# Patient Record
Sex: Female | Born: 1940 | Race: White | Hispanic: No | Smoking: Never smoker
Health system: Southern US, Community
[De-identification: ages and names within clinical notes are randomized; demographics above are authoritative.]

## PROBLEM LIST (undated history)

## (undated) DIAGNOSIS — R51 Headache: Secondary | ICD-10-CM

## (undated) DIAGNOSIS — L659 Nonscarring hair loss, unspecified: Secondary | ICD-10-CM

## (undated) DIAGNOSIS — E079 Disorder of thyroid, unspecified: Secondary | ICD-10-CM

## (undated) DIAGNOSIS — N189 Chronic kidney disease, unspecified: Secondary | ICD-10-CM

## (undated) DIAGNOSIS — R519 Headache, unspecified: Secondary | ICD-10-CM

## (undated) DIAGNOSIS — D72819 Decreased white blood cell count, unspecified: Secondary | ICD-10-CM

## (undated) DIAGNOSIS — F32A Depression, unspecified: Secondary | ICD-10-CM

## (undated) DIAGNOSIS — F329 Major depressive disorder, single episode, unspecified: Secondary | ICD-10-CM

## (undated) DIAGNOSIS — M1990 Unspecified osteoarthritis, unspecified site: Secondary | ICD-10-CM

## (undated) DIAGNOSIS — R251 Tremor, unspecified: Secondary | ICD-10-CM

## (undated) HISTORY — PX: WISDOM TOOTH EXTRACTION: SHX21

## (undated) HISTORY — DX: Decreased white blood cell count, unspecified: D72.819

## (undated) HISTORY — DX: Headache: R51

## (undated) HISTORY — PX: KNEE ARTHROSCOPY: SUR90

## (undated) HISTORY — DX: Chronic kidney disease, unspecified: N18.9

## (undated) HISTORY — PX: TUBAL LIGATION: SHX77

## (undated) HISTORY — DX: Nonscarring hair loss, unspecified: L65.9

## (undated) HISTORY — DX: Unspecified osteoarthritis, unspecified site: M19.90

## (undated) HISTORY — DX: Disorder of thyroid, unspecified: E07.9

## (undated) HISTORY — DX: Headache, unspecified: R51.9

## (undated) HISTORY — DX: Depression, unspecified: F32.A

## (undated) HISTORY — DX: Major depressive disorder, single episode, unspecified: F32.9

## (undated) HISTORY — DX: Tremor, unspecified: R25.1

---

## 1983-04-12 ENCOUNTER — Encounter (INDEPENDENT_AMBULATORY_CARE_PROVIDER_SITE_OTHER): Payer: Self-pay | Admitting: *Deleted

## 1998-08-31 ENCOUNTER — Encounter: Admission: RE | Admit: 1998-08-31 | Discharge: 1998-08-31 | Payer: Self-pay | Admitting: Family Medicine

## 1998-09-27 ENCOUNTER — Encounter: Admission: RE | Admit: 1998-09-27 | Discharge: 1998-09-27 | Payer: Self-pay | Admitting: Family Medicine

## 1999-12-20 ENCOUNTER — Other Ambulatory Visit: Admission: RE | Admit: 1999-12-20 | Discharge: 1999-12-20 | Payer: Self-pay | Admitting: *Deleted

## 2000-10-29 ENCOUNTER — Emergency Department (HOSPITAL_COMMUNITY): Admission: EM | Admit: 2000-10-29 | Discharge: 2000-10-29 | Payer: Self-pay | Admitting: Emergency Medicine

## 2001-01-07 ENCOUNTER — Ambulatory Visit (HOSPITAL_COMMUNITY): Admission: RE | Admit: 2001-01-07 | Discharge: 2001-01-07 | Payer: Self-pay | Admitting: Orthopedic Surgery

## 2001-01-15 ENCOUNTER — Other Ambulatory Visit: Admission: RE | Admit: 2001-01-15 | Discharge: 2001-01-15 | Payer: Self-pay | Admitting: *Deleted

## 2002-01-06 ENCOUNTER — Encounter: Admission: RE | Admit: 2002-01-06 | Discharge: 2002-01-06 | Payer: Self-pay | Admitting: Orthopedic Surgery

## 2002-01-06 ENCOUNTER — Encounter: Payer: Self-pay | Admitting: Orthopedic Surgery

## 2002-01-21 ENCOUNTER — Other Ambulatory Visit: Admission: RE | Admit: 2002-01-21 | Discharge: 2002-01-21 | Payer: Self-pay | Admitting: *Deleted

## 2003-02-16 ENCOUNTER — Other Ambulatory Visit: Admission: RE | Admit: 2003-02-16 | Discharge: 2003-02-16 | Payer: Self-pay | Admitting: Obstetrics and Gynecology

## 2004-03-02 ENCOUNTER — Other Ambulatory Visit: Admission: RE | Admit: 2004-03-02 | Discharge: 2004-03-02 | Payer: Self-pay | Admitting: Obstetrics and Gynecology

## 2004-07-04 ENCOUNTER — Encounter: Payer: Self-pay | Admitting: Internal Medicine

## 2005-05-10 ENCOUNTER — Encounter: Payer: Self-pay | Admitting: Pulmonary Disease

## 2005-05-10 ENCOUNTER — Ambulatory Visit (HOSPITAL_BASED_OUTPATIENT_CLINIC_OR_DEPARTMENT_OTHER): Admission: RE | Admit: 2005-05-10 | Discharge: 2005-05-10 | Payer: Self-pay | Admitting: Internal Medicine

## 2005-05-25 ENCOUNTER — Ambulatory Visit: Payer: Self-pay | Admitting: Pulmonary Disease

## 2005-06-25 ENCOUNTER — Ambulatory Visit: Payer: Self-pay | Admitting: Internal Medicine

## 2005-07-20 ENCOUNTER — Ambulatory Visit: Payer: Self-pay | Admitting: Internal Medicine

## 2006-02-07 ENCOUNTER — Ambulatory Visit: Payer: Self-pay | Admitting: Internal Medicine

## 2006-10-16 ENCOUNTER — Ambulatory Visit (HOSPITAL_BASED_OUTPATIENT_CLINIC_OR_DEPARTMENT_OTHER): Admission: RE | Admit: 2006-10-16 | Discharge: 2006-10-16 | Payer: Self-pay | Admitting: Internal Medicine

## 2006-10-16 ENCOUNTER — Encounter: Payer: Self-pay | Admitting: Pulmonary Disease

## 2006-10-29 ENCOUNTER — Ambulatory Visit: Payer: Self-pay | Admitting: Pulmonary Disease

## 2007-11-13 ENCOUNTER — Telehealth: Payer: Self-pay | Admitting: Internal Medicine

## 2007-11-14 ENCOUNTER — Telehealth: Payer: Self-pay | Admitting: Internal Medicine

## 2008-04-21 DIAGNOSIS — E039 Hypothyroidism, unspecified: Secondary | ICD-10-CM | POA: Insufficient documentation

## 2008-04-21 DIAGNOSIS — Z9189 Other specified personal risk factors, not elsewhere classified: Secondary | ICD-10-CM | POA: Insufficient documentation

## 2008-04-21 DIAGNOSIS — G4733 Obstructive sleep apnea (adult) (pediatric): Secondary | ICD-10-CM

## 2008-04-21 DIAGNOSIS — Z8672 Personal history of thrombophlebitis: Secondary | ICD-10-CM | POA: Insufficient documentation

## 2008-04-21 DIAGNOSIS — F329 Major depressive disorder, single episode, unspecified: Secondary | ICD-10-CM

## 2008-06-02 ENCOUNTER — Telehealth: Payer: Self-pay | Admitting: Internal Medicine

## 2008-06-07 ENCOUNTER — Telehealth: Payer: Self-pay | Admitting: Internal Medicine

## 2008-09-22 ENCOUNTER — Encounter: Payer: Self-pay | Admitting: Internal Medicine

## 2008-10-22 ENCOUNTER — Telehealth: Payer: Self-pay | Admitting: Internal Medicine

## 2008-12-09 ENCOUNTER — Ambulatory Visit: Payer: Self-pay | Admitting: Internal Medicine

## 2008-12-09 LAB — CONVERTED CEMR LAB
Free T4: 0.8 ng/dL (ref 0.6–1.6)
TSH: 2.08 microintl units/mL (ref 0.35–5.50)

## 2009-08-25 ENCOUNTER — Telehealth: Payer: Self-pay | Admitting: Internal Medicine

## 2009-08-29 ENCOUNTER — Ambulatory Visit: Payer: Self-pay | Admitting: Internal Medicine

## 2009-08-29 DIAGNOSIS — I872 Venous insufficiency (chronic) (peripheral): Secondary | ICD-10-CM | POA: Insufficient documentation

## 2009-08-29 LAB — CONVERTED CEMR LAB
ALT: 31 units/L (ref 0–35)
AST: 34 units/L (ref 0–37)
Albumin: 3.4 g/dL — ABNORMAL LOW (ref 3.5–5.2)
Alkaline Phosphatase: 57 units/L (ref 39–117)
BUN: 13 mg/dL (ref 6–23)
Basophils Absolute: 0 10*3/uL (ref 0.0–0.1)
Basophils Relative: 0.8 % (ref 0.0–3.0)
Bilirubin, Direct: 0.1 mg/dL (ref 0.0–0.3)
CO2: 31 meq/L (ref 19–32)
Calcium: 8.8 mg/dL (ref 8.4–10.5)
Chloride: 105 meq/L (ref 96–112)
Cholesterol: 164 mg/dL (ref 0–200)
Creatinine, Ser: 0.7 mg/dL (ref 0.4–1.2)
Direct LDL: 93.7 mg/dL
Eosinophils Absolute: 0 10*3/uL (ref 0.0–0.7)
Eosinophils Relative: 0.9 % (ref 0.0–5.0)
Free T4: 0.9 ng/dL (ref 0.6–1.6)
GFR calc non Af Amer: 88.59 mL/min (ref >60)
Glucose, Bld: 98 mg/dL (ref 70–99)
HCT: 39.7 % (ref 36.0–46.0)
HDL: 35 mg/dL — ABNORMAL LOW (ref >39.00)
Hemoglobin: 13.9 g/dL (ref 12.0–15.0)
Lymphocytes Relative: 40.2 % (ref 12.0–46.0)
Lymphs Abs: 1.7 10*3/uL (ref 0.7–4.0)
MCHC: 35.1 g/dL (ref 30.0–36.0)
MCV: 97.2 fL (ref 78.0–100.0)
Monocytes Absolute: 0.5 10*3/uL (ref 0.1–1.0)
Monocytes Relative: 11.9 % (ref 3.0–12.0)
Neutro Abs: 2.1 10*3/uL (ref 1.4–7.7)
Neutrophils Relative %: 46.2 % (ref 43.0–77.0)
Platelets: 172 10*3/uL (ref 150.0–400.0)
Potassium: 4.6 meq/L (ref 3.5–5.1)
RBC: 4.08 M/uL (ref 3.87–5.11)
RDW: 12.6 % (ref 11.5–14.6)
Sodium: 142 meq/L (ref 135–145)
TSH: 1.38 microintl units/mL (ref 0.35–5.50)
Total Bilirubin: 0.8 mg/dL (ref 0.3–1.2)
Total CHOL/HDL Ratio: 5
Total Protein: 6.5 g/dL (ref 6.0–8.3)
Triglycerides: 251 mg/dL — ABNORMAL HIGH (ref 0.0–149.0)
VLDL: 50.2 mg/dL — ABNORMAL HIGH (ref 0.0–40.0)
WBC: 4.3 10*3/uL — ABNORMAL LOW (ref 4.5–10.5)

## 2009-09-01 ENCOUNTER — Ambulatory Visit: Payer: Self-pay | Admitting: Pulmonary Disease

## 2009-09-01 DIAGNOSIS — G2589 Other specified extrapyramidal and movement disorders: Secondary | ICD-10-CM

## 2010-01-24 ENCOUNTER — Ambulatory Visit: Payer: Self-pay | Admitting: Pulmonary Disease

## 2010-01-24 DIAGNOSIS — G4710 Hypersomnia, unspecified: Secondary | ICD-10-CM | POA: Insufficient documentation

## 2010-02-07 ENCOUNTER — Encounter: Payer: Self-pay | Admitting: Internal Medicine

## 2010-02-07 ENCOUNTER — Ambulatory Visit (HOSPITAL_BASED_OUTPATIENT_CLINIC_OR_DEPARTMENT_OTHER): Admission: RE | Admit: 2010-02-07 | Discharge: 2010-02-07 | Payer: Self-pay | Admitting: Pulmonary Disease

## 2010-02-08 ENCOUNTER — Encounter: Payer: Self-pay | Admitting: Internal Medicine

## 2010-02-09 ENCOUNTER — Encounter (INDEPENDENT_AMBULATORY_CARE_PROVIDER_SITE_OTHER): Payer: Self-pay | Admitting: *Deleted

## 2010-02-21 ENCOUNTER — Ambulatory Visit: Payer: Self-pay | Admitting: Pulmonary Disease

## 2010-02-22 ENCOUNTER — Telehealth (INDEPENDENT_AMBULATORY_CARE_PROVIDER_SITE_OTHER): Payer: Self-pay | Admitting: *Deleted

## 2010-02-22 ENCOUNTER — Ambulatory Visit: Payer: Self-pay | Admitting: Pulmonary Disease

## 2010-02-23 ENCOUNTER — Ambulatory Visit: Payer: Self-pay | Admitting: Pulmonary Disease

## 2010-03-02 ENCOUNTER — Emergency Department (HOSPITAL_COMMUNITY): Admission: EM | Admit: 2010-03-02 | Discharge: 2010-03-02 | Payer: Self-pay | Admitting: Emergency Medicine

## 2010-03-14 ENCOUNTER — Ambulatory Visit: Payer: Self-pay | Admitting: Internal Medicine

## 2010-03-15 LAB — CONVERTED CEMR LAB
ALT: 19 units/L (ref 0–35)
AST: 19 units/L (ref 0–37)
Albumin: 3.7 g/dL (ref 3.5–5.2)
Alkaline Phosphatase: 52 units/L (ref 39–117)
Amylase: 138 units/L — ABNORMAL HIGH (ref 27–131)
BUN: 18 mg/dL (ref 6–23)
Basophils Absolute: 0 10*3/uL (ref 0.0–0.1)
Basophils Relative: 0.4 % (ref 0.0–3.0)
CO2: 29 meq/L (ref 19–32)
Calcium: 9.3 mg/dL (ref 8.4–10.5)
Chloride: 103 meq/L (ref 96–112)
Creatinine, Ser: 0.8 mg/dL (ref 0.4–1.2)
Eosinophils Absolute: 0.1 10*3/uL (ref 0.0–0.7)
Eosinophils Relative: 1.6 % (ref 0.0–5.0)
GFR calc non Af Amer: 75.82 mL/min (ref >60)
Glucose, Bld: 90 mg/dL (ref 70–99)
HCT: 39.5 % (ref 36.0–46.0)
Hemoglobin: 13.9 g/dL (ref 12.0–15.0)
Lipase: 29 units/L (ref 11.0–59.0)
Lymphocytes Relative: 30.1 % (ref 12.0–46.0)
Lymphs Abs: 1.7 10*3/uL (ref 0.7–4.0)
MCHC: 35.2 g/dL (ref 30.0–36.0)
MCV: 97.4 fL (ref 78.0–100.0)
Monocytes Absolute: 0.4 10*3/uL (ref 0.1–1.0)
Monocytes Relative: 7.7 % (ref 3.0–12.0)
Neutro Abs: 3.4 10*3/uL (ref 1.4–7.7)
Neutrophils Relative %: 60.2 % (ref 43.0–77.0)
Platelets: 197 10*3/uL (ref 150.0–400.0)
Potassium: 4.7 meq/L (ref 3.5–5.1)
RBC: 4.05 M/uL (ref 3.87–5.11)
RDW: 12.8 % (ref 11.5–14.6)
Sodium: 139 meq/L (ref 135–145)
Total Bilirubin: 0.3 mg/dL (ref 0.3–1.2)
Total Protein: 7 g/dL (ref 6.0–8.3)
WBC: 5.6 10*3/uL (ref 4.5–10.5)

## 2010-03-24 ENCOUNTER — Telehealth: Payer: Self-pay | Admitting: Internal Medicine

## 2010-04-03 ENCOUNTER — Telehealth: Payer: Self-pay | Admitting: Pulmonary Disease

## 2010-04-06 ENCOUNTER — Telehealth: Payer: Self-pay | Admitting: Internal Medicine

## 2010-04-14 ENCOUNTER — Telehealth (INDEPENDENT_AMBULATORY_CARE_PROVIDER_SITE_OTHER): Payer: Self-pay

## 2010-04-14 ENCOUNTER — Emergency Department (HOSPITAL_COMMUNITY): Admission: EM | Admit: 2010-04-14 | Discharge: 2010-04-14 | Payer: Self-pay | Admitting: Emergency Medicine

## 2010-04-17 ENCOUNTER — Ambulatory Visit: Payer: Self-pay | Admitting: Internal Medicine

## 2010-04-18 ENCOUNTER — Ambulatory Visit: Payer: Self-pay | Admitting: Cardiovascular Disease

## 2010-04-18 DIAGNOSIS — R142 Eructation: Secondary | ICD-10-CM

## 2010-04-18 DIAGNOSIS — R143 Flatulence: Secondary | ICD-10-CM

## 2010-04-18 DIAGNOSIS — R141 Gas pain: Secondary | ICD-10-CM | POA: Insufficient documentation

## 2010-04-20 ENCOUNTER — Telehealth: Payer: Self-pay | Admitting: Internal Medicine

## 2010-04-21 ENCOUNTER — Telehealth (INDEPENDENT_AMBULATORY_CARE_PROVIDER_SITE_OTHER): Payer: Self-pay | Admitting: *Deleted

## 2010-05-03 ENCOUNTER — Ambulatory Visit: Payer: Self-pay | Admitting: Internal Medicine

## 2010-05-04 ENCOUNTER — Ambulatory Visit: Payer: Self-pay | Admitting: Internal Medicine

## 2010-05-04 LAB — CONVERTED CEMR LAB
Bilirubin Urine: NEGATIVE
Ketones, ur: NEGATIVE mg/dL
Urobilinogen, UA: 0.2 (ref 0.0–1.0)
pH: 6 (ref 5.0–8.0)

## 2010-05-11 ENCOUNTER — Telehealth: Payer: Self-pay | Admitting: Internal Medicine

## 2010-05-16 ENCOUNTER — Encounter: Payer: Self-pay | Admitting: Internal Medicine

## 2010-05-17 ENCOUNTER — Telehealth: Payer: Self-pay | Admitting: Internal Medicine

## 2010-07-24 ENCOUNTER — Telehealth: Payer: Self-pay | Admitting: Internal Medicine

## 2010-07-28 ENCOUNTER — Encounter (INDEPENDENT_AMBULATORY_CARE_PROVIDER_SITE_OTHER): Payer: Self-pay | Admitting: *Deleted

## 2010-08-10 ENCOUNTER — Ambulatory Visit: Payer: Self-pay | Admitting: Internal Medicine

## 2010-08-10 DIAGNOSIS — R634 Abnormal weight loss: Secondary | ICD-10-CM

## 2010-08-10 DIAGNOSIS — R197 Diarrhea, unspecified: Secondary | ICD-10-CM

## 2010-08-10 DIAGNOSIS — R1031 Right lower quadrant pain: Secondary | ICD-10-CM | POA: Insufficient documentation

## 2010-08-11 ENCOUNTER — Telehealth: Payer: Self-pay | Admitting: Internal Medicine

## 2010-08-17 ENCOUNTER — Ambulatory Visit: Payer: Self-pay | Admitting: Internal Medicine

## 2010-10-05 ENCOUNTER — Telehealth: Payer: Self-pay | Admitting: Internal Medicine

## 2010-10-06 ENCOUNTER — Ambulatory Visit: Payer: Self-pay | Admitting: Internal Medicine

## 2010-10-06 DIAGNOSIS — N200 Calculus of kidney: Secondary | ICD-10-CM

## 2010-10-27 ENCOUNTER — Encounter: Payer: Self-pay | Admitting: Internal Medicine

## 2010-12-10 HISTORY — PX: LITHOTRIPSY: SUR834

## 2011-01-09 NOTE — Letter (Signed)
Summary: New Patient letter  [facility]  [address] [place], [place] [address]   Phone: [phone]  Fax: [phone]       02/09/2010 MRN: [id number]  [facility] [address] [place], [place]  [address]  Dear Ms. Narciso,  Welcome to the Gastroenterology Division at [facility].    You are scheduled to see Dr. Leone Payor on 03/14/2010 at 10:30AM on the 3rd floor at [facility], [address]  We ask that you try to arrive at our office 15 minutes prior to your appointment time to allow for check-in.  We would like you to complete the enclosed self-administered evaluation form prior to your visit and bring it with you on the day of your appointment.  We will review it with you.  Also, please bring a complete list of all your medications or, if you prefer, bring the medication bottles and we will list them.  Please bring your insurance card so that we may make a copy of it.  If your insurance requires a referral to see a specialist, please bring your referral form from your primary care physician.  Co-payments are due at the time of your visit and may be paid by cash, check or credit card.     Your office visit will consist of a consult with your physician (includes a physical exam), any laboratory testing he/she may order, scheduling of any necessary diagnostic testing (e.g. x-ray, ultrasound, CT-scan), and scheduling of a procedure (e.g. Endoscopy, Colonoscopy) if required.  Please allow enough time on your schedule to allow for any/all of these possibilities.    If you cannot keep your appointment, please call [phone] to cancel or reschedule prior to your appointment date.  This allows [place] the opportunity to schedule an appointment for another patient in need of care.  If you do not cancel or reschedule by 5 p.m. the business day prior to your appointment date, you will be charged a $50.00 late cancellation/no-show fee.    Thank you for choosing  [facility] for your medical needs.  We appreciate the opportunity to care for you.  Please visit [place] at our website  to learn more about our practice.                     Sincerely,                                                             The Gastroenterology Division

## 2011-01-09 NOTE — Procedures (Signed)
Summary: EGD/[facility]  EGD/[facility]   Imported By: Sherian Rein 03/15/2010 11:01:34  _____________________________________________________________________  External Attachment:    Type:   Image     Comment:   External Document

## 2011-01-09 NOTE — Assessment & Plan Note (Signed)
Summary: RIGHT SIDE ABD PAIN/PALE STOOL/VOMITING...AS.   History of Present Illness Visit Type: Initial Visit Primary GI MD: Stan Head MD [facility] Primary Provider: Jacques Navy MD Chief Complaint: Rt. sided pain History of Present Illness:   70 yo widowed white woman with several months of abdominal pain. Right upper and mid abd pain. Belching and burping alot. Began to notice it in early 2011 and especially last 6 weeks. She has been on digestive enzyes since dec 2010 so it probably started then. No triggers identified though she is avoiding wheat. Everlena Cooper organc flour seems to be tolerated. Still having problems despite the diet canges. Struggling with poor sleep and strained shoulder. Lost 50# ntentionally last year with reduced intake and exercise.  She is eating less, avoiding wheat and also using a digestive supplement with some benefit.   GI Review of Systems    Reports abdominal pain, acid reflux, belching, and  bloating.     Location of  Abdominal pain: right side.    Denies chest pain, dysphagia with liquids, dysphagia with solids, heartburn, loss of appetite, nausea, vomiting, vomiting blood, weight loss, and  weight gain.      Reports change in bowel habits.     Denies anal fissure, black tarry stools, constipation, diarrhea, diverticulosis, fecal incontinence, heme positive stool, hemorrhoids, irritable bowel syndrome, jaundice, light color stool, liver problems, rectal bleeding, and  rectal pain. Preventive Screening-Counseling & Management      Drug Use:  no.      Current Medications (verified): 1)  Levothroid 25 Mcg Tabs (Levothyroxine Sodium) .Marland Kitchen.. 1 By Mouth Once Daily 2)  Vit C .... Daily 3)  Calcium .... Daily 4)  Glucosamine .... Daily 5)  Colchicine .... Take By Mouth As Needed 6)  Rogaine 2 % Soln (Minoxidil) 7)  Vaniqa 13.9 % Crea (Eflornithine Hcl) 8)  Estrace 9)  Mirapex 0.25 Mg  Tabs (Pramipexole Dihydrochloride) .... By Mouth After  Dinner. 10)  Digest Gold Digestive Enzymes .... Take 1 Tablet Before Meals 11)  Gas-X Ultra Strength 180 Mg Caps (Simethicone) .... As Needed  Allergies (verified): 1)  ! Erythromycin 2)  ! 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 Tubal ligation Bilateral arthroscopy of the knees in 1993 WISDOM TEETH EXTRACTION, HX OF (ICD-V15.9)  Family History: rheumatism: : maternal grandmother cancer: maternal grandmother (colon)  Family History of Breast Cancer:Sister  Social History: Patient never smoked.  pt is widowed. pt has 3 children. pt is a retired housewife.  Occupation: Retired widowed (husband was Chief Financial Officer) Alcohol Use - yes Daily Caffeine Use Illicit Drug Use - no Drug Use:  no  Review of Systems       The patient complains of allergy/sinus and sleeping problems.         All other ROS negative except as per HPI.   Colonoscopy  Procedure date:  07/04/2004  Findings:      Results: Normal. Victorino Dike, MD  Comments:      Dr. Corinda Gubler recommended 8 year follow-up 2011 guidelines would be 10 years  Procedures Next Due Date:    Colonoscopy: 07/2014   Vital Signs:  Patient profile:   70 year old female Height:      64 inches Weight:  134.50 pounds BMI:     23.17 Pulse rate:   92 / minute Pulse rhythm:   regular BP sitting:   98 / 50  (left arm) Cuff size:   regular  Vitals Entered By: [name] CMA Duncan Dull) (March 14, 2010 11:43 AM)  Physical Exam  General:  Well developed, well nourished, no acute distress. Eyes:  PERRLA, no icterus. Mouth:  No deformity or lesions, dentition normal. Neck:   Supple; no masses or thyromegaly. Lungs:  Clear throughout to auscultation. Heart:  Regular rate and rhythm; no murmurs, rubs,  or bruits. Abdomen:  mildly tender RUQ and epigastric area without HSM, mass, hernia  Extremities:  No clubbing, cyanosis, edema or deformities noted. Neurologic:  Alert and  oriented x4;  grossly normal neurologically. Cervical Nodes:  No significant cervical or supraclavicular adenopathy.  Psych:  Alert and cooperative. Normal mood and affect.   Impression & Recommendations:  Problem # 1:  ABDOMINAL PAIN, UPPER (ICD-789.09) Assessment New She has been having this since Dec 2010. There are no worrisoe features. Assess with labs and give trial of PPI (Dexilant 60 mg daily) for 1 month then reassess. She will continue digestive enzyemes also.   Orders: TLB-CBC Platelet - w/Differential (85025-CBCD) TLB-CMP (Comprehensive Metabolic Pnl) (80053-COMP) TLB-Amylase (82150-AMYL) TLB-Lipase (83690-LIPASE)  Patient Instructions: 1)  Please go to the basement to have your lab tests drawn today. 2)  We will call results of the labs. 3)  You will start Dexilant 60 mg every AM 30-60 minutes before breakfast. 4)  Please schedule a follow-up appointment in 1 month.  5)  Copy sent to : Illene Regulus, MD 6)  The medication list was reviewed and reconciled.  All changed / newly prescribed medications were explained.  A complete medication list was provided to the patient / caregiver.

## 2011-01-09 NOTE — Procedures (Signed)
Summary: Colonoscopy: Dr. Doreatha Martin: Normal   Colonoscopy  Procedure date:  07/04/2004  Findings:      Results: Normal. Location:  [facility].    Procedures Next Due Date:    Colonoscopy: 07/2014  Patient Name: Linda Macdonald, Arrick MRN:  Procedure Procedures: Colonoscopy CPT: 91478.  Personnel: Endoscopist: Ulyess Mort, MD.  Referred By: Rosalyn Gess. Norins, MD.  Exam Location: Exam performed in Outpatient Clinic. Outpatient  Patient Consent: Procedure, Alternatives, Risks and Benefits discussed, consent obtained, from patient. Consent was obtained by the RN.  Indications  Increased Risk Screening: For family history of colorectal neoplasia, in  grandparent  History  Current Medications: Patient is not currently taking Coumadin.  Pre-Exam Physical: Entire physical exam was normal.  Exam Exam: Extent of exam reached: Cecum, extent intended: Cecum.  The cecum was identified by appendiceal orifice and IC valve. Colon retroflexion performed. Images were not taken. ASA Classification: II. Tolerance: good.  Monitoring: Pulse and BP monitoring, Oximetry used. Supplemental O2 given.  Colon Prep Prep results: good.  Sedation Meds: Patient assessed and found to be appropriate for moderate (conscious) sedation. Fentanyl 75 mcg. given IV. Versed 7 mg. given IV.  Findings - NOT SEEN ON EXAM: Cecum to Rectum. Polyps, AVM's, Colitis, Tumors, Melanosis, Crohn's, Diverticulosis, Hemorrhoids,   Assessment Normal examination.  Events  Unplanned Interventions: No intervention was required.  Unplanned Events: There were no complications. Plans Medication Plan: Continue current medications.  Patient Education: Patient given standard instructions for: a normal exam. Yearly hemoccult testing recommended. Patient instructed to get routine colonoscopy every 8 years.  Disposition: After procedure patient sent to recovery. After recovery patient sent home.   CC:    Illene Regulus, MD  This report was created from the original endoscopy report, which was reviewed and signed by the above listed endoscopist.

## 2011-01-09 NOTE — Progress Notes (Signed)
Summary: mirapex update  Phone Note Outgoing Call Call back at [facility] Phone [phone]   Call placed by: Arman Filter LPN,  April 03, 2010 4:44 PM Call placed to: Patient Summary of Call: [facility].  Pt last saw [facility] 02/23/2010 and was started on Mirapex but told to call and give update in 3 weeks but pt never called.  Now, I have received refill requests on Mirapex from Pharmacy.  need to talk to pt to see how she is doing on medicine.  if doing good, ok to send rx to pharmacy and pt will need to make 6 month f/u appt.   Initial call taken by: Arman Filter LPN,  April 03, 2010 4:45 PM  Follow-up for Phone Call        called and spoke with pt.  pt states she doesn't believe the Mirapex is helping and therefore did not want refills sent to pharmacy.  Pt states she hasn't noticed a change in her sleeping or her restless legs.  Pt states she did some "research" on this and wonders if this could be caused by a hormonal inbalance or inadequate Nutrition.  Will forward message to [facility] to address.  In the meantime, pt states she will call her GYN to get the results of her most recent hormone bloodwork.  Aundra Millet Reynolds LPN  April 04, 2010 4:27 PM   Additional Follow-up for Phone Call Additional follow up Details #1::        If the pt has seen no improvement with mirapex, no reason to continue.  It will be interesting to see if things worsen when she comes off mirapex.  Let me know how things go off the med. Additional Follow-up by: Barbaraann Share MD,  April 05, 2010 1:40 PM    Additional Follow-up for Phone Call Additional follow up Details #2::    pt advised, she states she will call back and let us know how things go. Carron Curie CMA  April 05, 2010 1:43 PM

## 2011-01-09 NOTE — Progress Notes (Signed)
Summary: Dexilant  samples gone  Phone Note Call from Patient Call back at [phone]   Call For: Dr Leone Payor Reason for Call: Talk to Nurse Summary of Call: 2wks ago given samples of Dexilant to try. They worked well but is all out. wonders if she can get [facility]  [address]. Would really like to speak to nurse before getting the refill - has one question Initial call taken by: Leanor Kail [facility],  March 24, 2010 10:41 AM  Follow-up for Phone Call        message left for pt to return call.  Francee Piccolo CMA [facility] Dull)  March 24, 2010 2:17 PM   Pt states Dexilant is a life saver.  She believes her samples were thrown away during some spring cleaning.  She requests a prescription to be sent as she can not get to our office before 5pm to pick up samples.  30-day rx sent. Follow-up by: Francee Piccolo CMA Duncan Dull),  March 24, 2010 2:26 PM    Prescriptions: DEXILANT 60 MG CPDR (DEXLANSOPRAZOLE) Take 1 capsule by mouth once a day 30-60 minutes before breakfast  #30 x 0   Entered by:   Francee Piccolo CMA (AAMA)   Authorized by:   Iva Boop MD, [facility]   Signed by:   Francee Piccolo CMA (AAMA) on 03/24/2010   Method used:   [facility]        [facility]  [address] [phone] (retail)       [address]       [place], [place]  [address]       Ph: [phone] or [phone]       Fax: [phone]   RxID:   [id number]   Appended Document: Dexilant  samples gone Verified with pharmacy that rx for Lansoprazole was received.  Message left for pt that meds were changed due to Prior Auth issues.  Call back from pt on Monday requested to discuss further.  Samples of Dexilant offered again if Lansoprazole does not work. Francee Piccolo CMA [facility] Dull)  March 24, 2010 5:11 PM

## 2011-01-09 NOTE — Progress Notes (Signed)
Summary: Symptom update  Phone Note Outgoing Call Call back at [facility] Phone [phone]   Call placed by: Darcey Nora RN, CGRN,  Apr 14, 2010 3:30 PM Call placed to: Patient Summary of Call: Patient wanted Dr Leone Payor to know that she had a large gas pain then she had one episode of vomiting and now she feels better.  She has the continued intermittent gas and pain and has been taking Pepcid 1 or 2 a day.  Patient  had called earlier and asked how many pepcid she could take a day.  Per Dr Marvell Fuller note up to 4 a day.  Patient  says she just had gas when we spoke , but then after drinking tea and eating a tangerine she had one episode of vomiting.  I advised the patient that I will pass this information along to Dr Leone Payor and call back if he has any  new orders.    Initial call taken by: Darcey Nora RN, CGRN,  Apr 14, 2010 3:36 PM     Appended Document: Symptom update recurrent vomiting and increasing RUQ opain no fever also ? cloudy urine I have advised she come to [facility] ED for evaluation ? Gallbladder problems, pyelonephritis, other?

## 2011-01-09 NOTE — Progress Notes (Signed)
Summary: Returning your call  Phone Note Call from Patient Call back at Home Phone [phone]   Call For: Dr Leone Payor Summary of Call: Returning nurses call from last wk- she was out of town. Initial call taken by: Leanor Kail [facility],  May 11, 2010 9:48 AM  Follow-up for Phone Call        Patient 's phone is busy, I will continue to try and reach the patient  Follow-up by: Darcey Nora RN, CGRN,  May 11, 2010 10:00 AM  Additional Follow-up for Phone Call Additional follow up Details #1::        Spoke with the patient reviewed UA with her and Dr Marvell Fuller recommendations.  She will contact Dr Debby Bud office. Additional Follow-up by: Darcey Nora RN, CGRN,  May 11, 2010 10:53 AM

## 2011-01-09 NOTE — Progress Notes (Signed)
----   Converted from flag ---- ---- 02/22/2010 3:58 PM, Arman Filter LPN wrote: called and spoke with pt.  pt scheduled to see kc tomorrow 02-23-2010 at 3:15pm   ---- 02/22/2010 3:57 PM, Arman Filter LPN wrote: pt needs ov with kc to discuss sleep study results. ------------------------------

## 2011-01-09 NOTE — Letter (Signed)
Summary: Appt Reminder 2   Gastroenterology  [address] [place], [place] [address]   Phone: [phone]  Fax: [phone]        July 28, 2010 MRN: [id number]    [facility] [address] [place], [place]  [address]    Dear Ms. Requejo,   You have a return appointment with Dr.Carl Leone Payor on 08-10-10 at 2:15pm. Please remember to bring a complete list of the medicines you are taking, your insurance card and your co-pay.  If you have to cancel or reschedule this appointment, please call before 5:00 pm the evening before to avoid a cancellation fee.  If you have any questions or concerns, please call [phone].    Sincerely,    Laureen Ochs LPN  Appended Document: Appt Reminder 2 Letter mailed to patient.

## 2011-01-09 NOTE — Consult Note (Signed)
Summary: [facility]   Imported By: Sherian Rein 11/08/2010 08:37:30  _____________________________________________________________________  External Attachment:    Type:   Image     Comment:   External Document

## 2011-01-09 NOTE — Assessment & Plan Note (Signed)
Summary: POST ER FU--STOMACH PAIN AND VOMITING--STC   Vital Signs:  Patient profile:   70 year old female Height:      64 inches Weight:      130 pounds BMI:     22.40 O2 Sat:      98 % on Room air Temp:     98.0 degrees F oral Pulse rate:   76 / minute BP sitting:   110 / 58  (left arm) Cuff size:   regular  Vitals Entered By: Bill Salinas CMA (Apr 17, 2010 9:42 AM)  O2 Flow:  Room air CC: pt here for follow up from ER visit, pt presented to Er With abd pain and vomitting, she was given round of sulfameth for UTI at ER/ ab   Primary Care Provider:  Jacques Navy MD  CC:  pt here for follow up from ER visit, pt presented to Er With abd pain and vomitting, and she was given round of sulfameth for UTI at ER/ ab.  History of Present Illness: Patient seen for ED follow-up: she presented due to episode of vomiting x 4. she has had a several months history of abdominal pain that has been evaluated by Dr. Leone Payor. She was treated with dexilant with pretty good response with decreased discomfort. She has had decreased eructation. However, she has had generalized abdominal discomfort that is unrelieved. She denies unintentional weight loss, no night sweats, no loss of energy or stamina, no change in bowel habit.  ED records reviewed: exam was unremarkable; CBC, CMet were normal. U/A with blood and LE. Abdominal U/S with 4mm gallbladder sludge vs polyp but normal wall, no CBD dilation. She incidentally has a 2.2cm right renal pelvis stone with no hydronephrosis. She was treated with anti-emetics, fluids and released home.   Current Medications (verified): 1)  Levothroid 25 Mcg Tabs (Levothyroxine Sodium) .Marland Kitchen.. 1 By Mouth Once Daily 2)  Vit C .... Daily 3)  Calcium .... Daily 4)  Glucosamine .... Daily 5)  Colchicine .... Take By Mouth As Needed 6)  Rogaine 2 % Soln (Minoxidil) 7)  Vaniqa 13.9 % Crea (Eflornithine Hcl) 8)  Estrace 9)  Mirapex 0.25 Mg  Tabs (Pramipexole Dihydrochloride)  .... By Mouth After Dinner. 10)  Digest Gold Digestive Enzymes .... Take 1 Tablet Before Meals 11)  Gas-X Ultra Strength 180 Mg Caps (Simethicone) .... As Needed 12)  Dexilant 30 Mg Cpdr (Dexlansoprazole) .Marland Kitchen.. 1 By Mouth Each Am 30 Minutes Before Breakfast  Allergies (verified): 1)  ! Erythromycin 2)  ! 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  pt is widowed. pt has 3 children. pt is a retired housewife.  Occupation: Retired widowed (husband was Chief Financial Officer) Alcohol Use - yes Daily Caffeine Use Illicit Drug Use - no  Risk Factors: Smoking Status: never (09/01/2009)  Review of Systems       The patient complains of weight loss, abdominal pain, and severe indigestion/heartburn.  The patient denies anorexia, fever, weight gain, vision loss, decreased hearing, chest  pain, syncope, dyspnea on exertion, prolonged cough, hemoptysis, melena, hematochezia, hematuria, muscle weakness, difficulty walking, depression, abnormal bleeding, and enlarged lymph nodes.         intentional weight loss through healthy diet and exercise. She is drinking 3 liters of fluid daily, including lemonade. GI:  Complains of abdominal pain and gas; denies bloody stools, change in bowel habits, constipation, dark tarry stools, diarrhea, excessive  appetite, hemorrhoids, and loss of appetite.  Physical Exam  General:  WNWD, fit appearing white female in no distress Head:  Normocephalic and atraumatic without obvious abnormalities. No apparent alopecia or balding. Eyes:  pupils equal, pupils round, corneas and lenses clear, and no injection.   Lungs:  normal respiratory effort and normal breath sounds.   Heart:  normal rate, regular rhythm, no murmur, and no gallop.   Abdomen:  soft, normal bowel sounds, no distention, no masses, no guarding, no rigidity, no rebound tenderness, and no hepatomegaly.  There is mild tenderness to deep palpation RUQ under ribcage and at right quadrant and the level just above the umbilicus. No mass is appreciated.  Msk:  normal ROM, no joint tenderness, no joint swelling, and no redness over joints.   Pulses:  2+ radial pulses Extremities:  No clubbing, cyanosis, edema, or deformity noted with normal full range of motion of all joints.   Neurologic:  alert & oriented X3, cranial nerves II-XII intact, and gait normal.   Skin:  turgor normal and color normal.   Psych:  Oriented X3, memory intact for recent and remote, normally interactive, and good eye contact.     Impression & Recommendations:  Problem # 1:  ABDOMINAL PAIN, UPPER (ICD-789.09) Patient with persistent low grade abdominal pain/discomfort. She has had symptoms for several months with no associated systemic symptoms. She does have a large stone in the right renal pelvis but no renal colic. The discomfort which was more general is now more right sided.  Plan - CT abdomen and pelvis with contrast to r/o mass, lymphadenopathy, adnexal abnormality.  Orders: Radiology Referral (Radiology)  Problem # 2:  ERUCTATION (ICD-787.3) Her symptoms of eructation/dyspepsia are better with dexilant. She does not have early satiety or pain. She has continued with Dr. Leone Payor for eval and treatment.  Problem # 3:  NAUSEA WITH VOMITING (ICD-787.01) Episode  May 6th of N/V with ED evaluation. Symptoms have resolved.  Complete Medication List: 1)  Levothroid 25 Mcg Tabs (Levothyroxine sodium) .Marland Kitchen.. 1 by mouth once daily 2)  Vit C  .... Daily 3)  Calcium  .... Daily 4)  Glucosamine  .... Daily 5)  Colchicine  .... Take by mouth as needed 6)  Rogaine 2 % Soln (Minoxidil) 7)  Vaniqa 13.9 % Crea (Eflornithine hcl) 8)  Estrace  9)  Mirapex 0.25 Mg Tabs (Pramipexole dihydrochloride) .... By mouth after dinner. 10)  Digest Gold Digestive Enzymes  .... Take 1 tablet before meals 11)  Gas-x Ultra Strength 180 Mg Caps (Simethicone) .... As needed 12)  Dexilant 30 Mg Cpdr (Dexlansoprazole) .Marland Kitchen.. 1 by mouth each am 30 minutes before breakfast

## 2011-01-09 NOTE — Progress Notes (Signed)
Summary: TTG Ab and IgA level Ordered  Phone Note Outgoing Call   Summary of Call: Please call her 9/6 or so and tell her I recommend she have blood tests for celiac sprue do not think she has had this (please confirm) if not then do TTG Ab and IgA level Iva Boop MD, [facility]  August 11, 2010 6:09 PM   Follow-up for Phone Call        Above MD orders reviewed with patient. She will have labs done today. She requests a copy be forwarded to Dr.Dickstein. Pt. instructed to call back as needed.  Follow-up by: Laureen Ochs LPN,  August 16, 2010 10:07 AM     Appended Document: TTG Ab and IgA level Ordered Labs faxed to Dr.Dickstein per pt. request.

## 2011-01-09 NOTE — Progress Notes (Signed)
Summary: REFERRAL?   Phone Note Call from Patient Call back at [facility] Phone [phone] Call back at [phone]   Summary of Call: Please review labs from Dr Teresita Madura office. She was advised he may need referral from Dr Debby Bud. Please advise.  Initial call taken by: Lamar Sprinkles, CMA,  May 11, 2010 11:27 AM  Follow-up for Phone Call        Patient treated May 7th with septra for uti. She had a U/A 5/26 with 7-10 RBC 11-20 WBC, + LE. She is asymptomatic but has rusty colored urine.  Plan - cipro 250 bid x 5           f/u UA 5 days after treatment           for persistent hematuria - GU referral.         Follow-up by: Jacques Navy MD,  May 11, 2010 5:05 PM
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
## 2011-01-09 NOTE — Progress Notes (Signed)
Summary: Triage  Phone Note Call from Patient Call back at Work Phone [phone]   Caller: Patient Call For: Dr. Leone Payor Reason for Call: Talk to Nurse Summary of Call: painful bloating and vomiting today... thinks these are symptoms from gallstones... would like to discuss with nurse Initial call taken by: Vallarie Mare,  July 24, 2010 4:12 PM  Follow-up for Phone Call        Message left for patient to callback. Laureen Ochs LPN  July 25, 2010 8:06 AM  Message left for patient to callback. Laureen Ochs LPN  July 25, 2010 1:32 PM  Message left for patient to callback. Laureen Ochs LPN  July 26, 2010 8:20 AM   Additional Follow-up for Phone Call Additional follow up Details #1::        Pt. c/o ongoing symptoms--Burps first thing in the morning and burps all day.  Has bloating and intermittent RUQ pain. Dexilant improved symptoms,somewhat, "Any improvement is an improvemet." Has tried drinking Aloe and ginger tea with some improvement in symptoms.  Pt. states the symptoms are "Shutting my life down." She is afraid to go out or on a date.  1) Gas-x,Phazyme, etc. QID and as needed for gas & bloating. 2) Soft,bland diet. No spicy,greasy,fried foods. 3) tylenol/Ibuprofen as needed 4) Continue Dexilant QAM 5) Continue other measures if they are helping. 6) If symptoms become worse call back immediately or go to ER. 7) I will call pt., if new orders, after MD reviews.    Additional Follow-up by: Laureen Ochs LPN,  July 26, 2010 12:10 PM    Additional Follow-up for Phone Call Additional follow up Details #2::    she will need an rev - not urgent for what seem to be chronic sxs with exacerbation suspecting IBS agree with other recs also Iva Boop MD, [facility]  July 28, 2010 7:58 AM   Additional Follow-up for Phone Call Additional follow up Details #3:: Details for Additional Follow-up Action Taken: Above MD orders reviewed with patient.  Pt. to keep  scheduled office visit 08-10-10 at 2:15pm. Pt. instructed to call back as needed.  Additional Follow-up by: Laureen Ochs LPN,  July 28, 2010 9:54 AM

## 2011-01-09 NOTE — Progress Notes (Signed)
Summary: RESULTS  Phone Note Call from Patient Call back at Home Phone [phone] Call back at [phone]   Summary of Call: Patient is requesting results of CT Initial call taken by: Lamar Sprinkles, CMA,  Apr 20, 2010 2:54 PM  Follow-up for Phone Call        callled pt and discussed stone, negative CT otherwise. Discussed CCP - referred to [email or web address]. Follow-up by: Jacques Navy MD,  Apr 20, 2010 6:37 PM

## 2011-01-09 NOTE — Assessment & Plan Note (Signed)
Summary: discuss GI issues/SD   Vital Signs:  Patient profile:   70 year old female Height:      64 inches Weight:      123 pounds BMI:     21.19 O2 Sat:      98 % on Room air Temp:     97.6 degrees F oral Pulse rate:   62 / minute BP sitting:   114 / 62  (left arm) Cuff size:   regular  Vitals Entered By: Bill Salinas CMA (October 06, 2010 2:52 PM)  O2 Flow:  Room air CC: pt continues to have abd discomfort and continuing to lose weight/ ab Comments pt states she is no longer taking dexilant   Primary Care Provider:  Jacques Navy MD  CC:  pt continues to have abd discomfort and continuing to lose weight/ ab.  History of Present Illness: Linda Macdonald presents today to discuss her ongoing GI problems.  She has noticed this ongoing right sided pain that comes and goes for the past 14 months.  She doesn't connect the pain with any specific type of food nor does changing her diet really help to relieve it.  She now tried to eat only organic food and has been eating smaller portions.  She states that helps her some in that she may not feel as bloated but that the aggravating pain in her right side will still come and go with no warning.  She has had a few episodes of vomiting that she has noticed coincide with consumption of spinich.  She also states that she has a lot of gas and often feels bloated and has feelings of early satiety.  She has continued to lose weight but she does attribute some of the weight loss to the fact that she is afraid to eat in fear of the GI discomfort she feels.  She denies however having any bloody stools, any light or dark stools, irregular BMs, lose stools, or constipation.  Her last colonoscopy was 7 years ago and was normal.  She did go to a GI specialist who ordered a CT scan that showed a 2cm x 1cm stone in the renal pelvis.  She also had a UA at the time that showed microhematuria.     Current Medications (verified): 1)  Levothroid 25 Mcg Tabs  (Levothyroxine Sodium) .Marland Kitchen.. 1 By Mouth Once Daily 2)  Vit C .... Daily 3)  Calcium .... Daily 4)  Glucosamine .... Daily 5)  Colchicine .... Take By Mouth As Needed 6)  Estrace 7)  Digest Gold Digestive Enzymes .... Take 1 Tablet Before Meals 8)  Gas-X Ultra Strength 180 Mg Caps (Simethicone) .... As Needed 9)  Dexilant 60 Mg Cpdr (Dexlansoprazole) .Marland Kitchen.. 1 By Mouth Every Am 30 Minutes Before Breakfast 10)  Chinese Herb For Kidney Stones 11)  Align  Caps (Probiotic Product) .... Take 1 Capsule By Mouth Once A Day  Allergies (verified): 1)  ! Erythromycin 2)  ! 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  pt is widowed. pt has 3 children. pt is a retired housewife.  Occupation: Retired widowed (husband was Chief Financial Officer) Alcohol Use - yes Daily Caffeine Use Illicit Drug Use - no  Risk Factors: Smoking Status: never (09/01/2009)  Review of Systems       The patient complains of weight loss and abdominal pain.  The patient denies anorexia, fever, weight gain, chest pain, dyspnea on exertion, headaches, hemoptysis, melena, hematochezia, severe indigestion/heartburn, hematuria, incontinence, and abnormal bleeding.    Physical Exam  General:  alert and well-developed.  Has lost quite a bit of weight. Head:   normocephalic and atraumatic.   Eyes:  pupils equal, pupils round, and pupils reactive to light.  C&S clear Mouth:  pharynx pink and moist, no erythema, and no exudates.   Neck:  supple, no thyromegaly, and no thyroid nodules or tenderness.   Lungs:  normal respiratory effort, normal breath sounds, no crackles, and no wheezes.   Heart:  normal rate, regular rhythm, no murmur, no gallop, and no rub.   Abdomen:  soft, normal bowel sounds, no distention, no masses, no guarding, no rigidity, no rebound tenderness, no abdominal hernia, no hepatomegaly, no splenomegaly, epigastric tenderness, and RLQ tenderness.   Pulses:  R radial normal, R posterior tibial normal, R dorsalis pedis normal, L radial normal, L posterior tibial normal, and L dorsalis pedis normal.   Neurologic:  alert & oriented X3, cranial nerves II-XII intact, and gait normal.   Skin:  turgor normal and color normal.   Psych:  Oriented X3, memory intact for recent and remote, and good eye contact.     Impression & Recommendations:  Problem # 1:  ABDOMINAL PAIN RIGHT LOWER QUADRANT (ICD-789.03) Due to the location of the pain that can be reproduced on palpation and the results from the CT scan and the UA the stone in the renal pelvis most likely explains the intermittent pain she is feeling as renal colic in her RLQ.  Plan:  Referral to a Urologist  Problem # 2:  WEIGHT LOSS (ICD-783.21) She does seem to have some signs of IBS (early satiety, flatus, distention, and burborygmi) which could be causing her continual GI difficulties and could account for her decreased food intake and weight loss.  She has been decreasing her meal size and this does seem to help with some of the symptoms again reinforcing the current diagnosis of IBS.  Plan:  Continue current diet plan (small meals)           Continue adding fiber to diet           Levsin SL BRAND NAME ONLY as needed for IBS flares  Complete Medication List: 1)  Levothroid 25 Mcg  Tabs (Levothyroxine sodium) .Marland Kitchen.. 1 by mouth once daily 2)  Vit C  .... Daily 3)  Calcium  .... Daily 4)  Glucosamine  .... Daily 5)  Colchicine  .... Take by mouth as needed 6)  Estrace  7)  Digest Gold Digestive Enzymes  .... Take 1 tablet before meals 8)  Gas-x Ultra Strength 180 Mg Caps (Simethicone) .... As needed 9)  Dexilant 60 Mg Cpdr (Dexlansoprazole) .Marland Kitchen.. 1 by mouth every am 30 minutes before breakfast 10)  Chinese Herb For Kidney Stones  11)  Align Caps (Probiotic product) .... Take 1 capsule by mouth once a day 12)  Levsin/sl 0.125 Mg Subl (Hyoscyamine sulfate) .Marland Kitchen.. 1 by mouth q 2 hrs as needed for eructation, satiety, borborygmi or flatus  Other Orders: Urology Referral (Urology) Prescriptions: LEVSIN/SL 0.125 MG SUBL (HYOSCYAMINE SULFATE) 1 by mouth q 2 hrs as needed for eructation, satiety, borborygmi or flatus Brand medically necessary #30 x 2   Entered and Authorized by:   Jacques Navy MD   Signed by:   Jacques Navy MD on 10/06/2010   Method used:   Electronically to        [facility] [phone] (retail)       [address]       [place], [place]  [address]       Ph: [phone] or [phone]       Fax: [phone]   RxID:   [id number]    Orders Added: 1)  Est. Patient Level IV [25427] 2)  Urology Referral [Urology]

## 2011-01-09 NOTE — Assessment & Plan Note (Signed)
Summary: 1 MO F/U..AM.   History of Present Illness Visit Type: Follow-up Visit Primary GI MD: Stan Head MD [facility] Primary Provider: Jacques Navy MD Chief Complaint: f/u getting better History of Present Illness:   70 yo widowed white woman with several months of right-sided abdominal pain. She was seen in April 2011, treated with Dexilant and that has helped considerably. She did have a spell of nausea and vomiting, received ED evaluation and treatment and subsequent abd/pelvic CT. CT showed a large right renal pelvis stone and some caliectasis but no ureteral dilation. she is better with only intermittent mild R mid abdominal discomfort. she is still losing weight by intent. Last week, two trees fell on her house and that has been somewhat stressful.  still has some right-sided abdominal pain and it deters her from travelling  acupuncturist/chinese herb Tx for kidney stones weight loss is intentional   GI Review of Systems    Reports abdominal pain and  weight loss.     Location of  Abdominal pain: right side.    Denies acid reflux, belching, bloating, chest pain, dysphagia with liquids, dysphagia with solids, heartburn, loss of appetite, nausea, vomiting, vomiting blood, and  weight gain.        Denies anal fissure, black tarry stools, change in bowel habit, constipation, diarrhea, diverticulosis, fecal incontinence, heme positive stool, hemorrhoids, irritable bowel syndrome, jaundice, light color stool, liver problems, rectal bleeding, and  rectal pain. second ultasound entry is incorrect it is CT    Clinical Reports Reviewed:  CT Ab/Pelvis:  04/18/2010:  1.  18 x 9 mm calculus in right renal pelvis with mild pelvicaliectasis.  No ureteral calculi identified. 2. 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 3.  Tiny hepatic and splenic cysts.  [place] of Abdomen  Procedure date:  04/14/2010  Findings:        No acute  abnormality.    Adherent sludge ball versus polyp in the gallbladder without   evidence for acute cholecystitis.    Probable angiomyolipoma at the mid aspect of the left kidney.    2.2 cm right renal pelvic calculus without hydronephrosis  [place] of Abdomen  Procedure date:  04/18/2010  Findings:      1.  18 x 9 mm calculus in right renal pelvis with mild pelvicaliectasis.  No ureteral calculi identified. 2. 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 3.  Tiny hepatic and splenic cysts.  CT Abdomen/Pelvis  Procedure date:  04/18/2010  Findings:      1.  18 x 9 mm calculus in right renal pelvis with mild pelvicaliectasis.  No ureteral calculi identified. 2. 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 3.  Tiny hepatic and splenic cysts.   Current Medications (verified): 1)  Levothroid 25 Mcg Tabs (Levothyroxine Sodium) .Marland Kitchen.. 1 By Mouth Once Daily 2)  Vit C .... Daily 3)  Calcium .... Daily 4)  Glucosamine .... Daily 5)  Colchicine .... Take By Mouth As Needed 6)  Rogaine 2 % Soln (Minoxidil) 7)  Vaniqa 13.9 % Crea (Eflornithine Hcl) 8)  Estrace 9)  Mirapex 0.25 Mg  Tabs (Pramipexole Dihydrochloride) .... By Mouth After Dinner. 10)  Digest Gold Digestive Enzymes .... Take 1 Tablet Before Meals 11)  Gas-X Ultra Strength 180 Mg Caps (Simethicone) .... As Needed 12)  Dexilant 60 Mg Cpdr (Dexlansoprazole) .Marland Kitchen.. 1 By  Mouth Every Am 30 Minutes Before Breakfast 13)  Chinese Herb For Kidney Stones  Allergies (verified): 1)  ! Erythromycin 2)  ! 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 Tubal ligation Bilateral arthroscopy of the knees in 1993 WISDOM TEETH EXTRACTION, HX OF (ICD-V15.9)  Family History: Reviewed history from 03/14/2010 and no changes required. rheumatism: : maternal grandmother cancer: maternal grandmother (colon)  Family History of Breast Cancer:Sister  Social History: Reviewed history from 03/14/2010 and no changes required. Patient never smoked.  pt is widowed. pt has 3 children. pt is a retired housewife.  Occupation: Retired widowed (husband was Chief Financial Officer) Alcohol Use - yes Daily Caffeine Use Illicit Drug Use - no  Vital Signs:  Patient profile:   70 year old female Height:      64 inches Weight:      135 pounds BMI:     23.26 Pulse rate:   68 / minute BP sitting:   98 / 40  (right arm)  Vitals Entered By: Chales Abrahams CMA Duncan Dull) (May 03, 2010 3:53 PM)  Physical Exam  General:  WNWD, fit appearing white female in no distress   Impression & Recommendations:  Problem # 1:  ABDOMINAL PAIN, UPPER (ICD-789.09) Assessment Improved She is overall better on Dexilant. I have some ? if right renal pelvis stone could be some of her problem. we discussed that not typically the case until stone in ureter but there is some caliectasis and stone is large. She also had  microhematuria and pyuria, Ucx 15K mixed species. I told her I would communicate this with Dr. Debby Bud and let them decide re: should she see a urologist. i think this could be reasonable though she is not interested in intervention for the stone (ESWL?) at this time.. [place] 5/6 showed the kidney stone and suspected angiomyolipoma. Gallbladder sludge, ? polyp. CT abd/pelvis otherwise ok except right adrenal andiomyolipoma, as were labs in April except trivial amylase increaseand ED blood labs (CMP, lipase, CBC). - ask mn re gu? she will stay on Dexilant and consider tapering off after a few months  depending upon how she is. I did not ask today but do think a repeat urinalysis is appropriate and will ask her to do so. UA SHOWS PERSISTENT HEMATURIA AND PYURIA SHE SHOULD SEE GU IN MY OPINIION  Problem # 2:  ERUCTATION (ICD-787.3) Assessment: Improved Dexilant has significantly helped this. To continue for now and consider taper later.  Patient Instructions: 1)  Please pick up your medications at your pharmacy. DEXILANT 2)  If you decide to stop Dexilant you should taper as discussed with Dr. Leone Payor. 3)  Please schedule a follow-up appointment as needed.  4)  Copy sent to : Illene Regulus, MD 5)  The medication list was reviewed and reconciled.  All changed / newly prescribed medications were explained.  A complete medication list was provided to the patient / caregiver. Prescriptions: DEXILANT 60 MG CPDR (DEXLANSOPRAZOLE) 1 by mouth every AM 30 minutes before breakfast  #30 x 11   Entered and Authorized by:   Iva Boop MD, [facility] [place]   Signed by:   Iva Boop MD, [facility] on 05/03/2010   Method used:   Electronically to  [facility] [address] [phone]* (retail)       [address]       [place], [place]  [address]       Ph: [phone] or [phone]       Fax: [phone]   RxID:   [id number]  Patient: Linda Macdonald Note: All result statuses are Final unless otherwise noted.  Tests: (1) UDip Only (UDIP)   Color                     LT. YELLOW       RANGE:  Yellow;Lt. Yellow   Clarity                   CLOUDY                      Clear   Specific Gravity          1.015                       1.000 - 1.030   Urine Ph                  6.0                         5.0-8.0   Protein                   100                         Negative   Urine Glucose             NEGATIVE                    Negative   Ketones                   NEGATIVE                    Negative   Urine Bilirubin           NEGATIVE                    Negative   Blood                      LARGE                       Negative   Urobilinogen              0.2                         0.0 - 1.0   Leukocyte Esterace        SMALL                       Negative   Nitrite                   NEGATIVE                    Negative  Tests: (2) UDip w/Micro (URINE)   Color  LT. YELLOW       RANGE:  Yellow;Lt. Yellow   Clarity                   CLOUDY                      Clear   Specific Gravity          1.015                       1.000 - 1.030   Urine Ph                  6.0                         5.0-8.0   Protein                   100                         Negative   Urine Glucose             NEGATIVE                    Negative   Ketones                   NEGATIVE                    Negative   Urine Bilirubin           NEGATIVE                    Negative   Blood                     LARGE                       Negative   Urobilinogen              0.2                         0.0 - 1.0   Leukocyte Esterace        SMALL                       Negative   Nitrite                   NEGATIVE                    Negative   Urine WBC                 11-20/hpf                   0-2/hpf   Urine RBC                 7-10/hpf                    0-2/hpf   Urine Epith               Rare(0-4/hpf)               Rare(0-4/hpf)  Note: An exclamation mark (!) indicates a result that was not dispersed into  the flowsheet. Document Creation Date: 05/04/2010 1:21 PM

## 2011-01-09 NOTE — Assessment & Plan Note (Signed)
Summary: ov to review npsg/mslt for EDS   Primary Provider/Referring Provider:  Jacques Navy MD  CC:  Pt is here for a f/u appt to discuss sleep study results. .  History of Present Illness: The pt comes in today to review her recent npsg/mslt for peristent daytime sleepiness.  The pt has a history of osa, but lost significant weight and continued to have daytime symptoms.  Her npsg showed no significant osa, with AHI of only 3/hr, but did show 210 PLMS with 7/hr resulting in arousal or awakenings.  Sleep logs prior to testing did show fairly frequent awakenings, with some nights having prolonged periods of wakefulness afterwards.  Her MSLT showed a mean sleep latency of 9.25min, with no sleep onset REM"S.  This is consistent with borderline objective sleepiness, but nothing to suggest narcolepsy or idiopathic hypersomnia.  I have reviewed the studies in detail with her, and have answered all of her questions.  Current Medications (verified): 1)  Levothroid 25 Mcg Tabs (Levothyroxine Sodium) .Marland Kitchen.. 1 By Mouth Once Daily 2)  Vit C .... Daily 3)  Calcium .... Daily 4)  Glucosamine .... Daily 5)  Colchicine .... Take By Mouth As Needed 6)  Rogaine 2 % Soln (Minoxidil) 7)  Vaniqa 13.9 % Crea (Eflornithine Hcl) 8)  Estrace  Allergies (verified): 1)  ! Erythromycin 2)  ! Penicillin  Vital Signs:  Patient profile:   70 year old female Height:      64 inches Weight:      138.50 pounds BMI:     23.86 O2 Sat:      99 % on Room air Temp:     97.5 degrees F oral Pulse rate:   78 / minute BP sitting:   126 / 68  (left arm) Cuff size:   regular  Vitals Entered By: Arman Filter LPN (February 23, 2010 3:11 PM)  O2 Flow:  Room air CC: Pt is here for a f/u appt to discuss sleep study results.  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  There may be a component of insomnia as well which kicks in once she awakens, and makes matters worse.  I would like to treat her with a course of dopamine agonists to see if she sees mprovement.  Problem # 2:  HYPERSOMNIA (ICD-780.54)  there is nothing to suggest narcolepsy or idiopathic hypersomnia by her MSLT.  She does not have osa by her current sleep study, but does still snore.  I would like to treat her leg jerks as above, but if she does not see a difference with dopamine agonist, would consider trying klonopine which may treat both her plms and help with insomnia.  Time spent with pt today reviewing studies, discussing treatment plans, was 22 min.   Medications Added to Medication List This Visit: 1)  Rogaine 2 % Soln (Minoxidil) 2)  Vaniqa 13.9 % Crea (Eflornithine hcl) 3)  Estrace  4)  Mirapex 0.25 Mg Tabs (Pramipexole dihydrochloride) .... By mouth after dinner.  Other Orders: Est. Patient Level III (75170)  Patient Instructions: 1)  will try mirapex 0.25mg  one after dinner each night.  After one week, if still having issues, can try taking 2  each night 2)  give me feedback in 3 weeks with how things are going.   Prescriptions: MIRAPEX 0.25 MG  TABS (PRAMIPEXOLE DIHYDROCHLORIDE) By mouth after dinner.  #30 x 0   Entered and Authorized by:   Barbaraann Share MD   Signed by:   Barbaraann Share MD on 02/23/2010   Method used:   Print then Give to Patient   RxID:   [id number]

## 2011-01-09 NOTE — Progress Notes (Signed)
Summary: REQ A CALL FROM MD  Phone Note Call from Patient Call back at Home Phone [phone]   Summary of Call: Pt left vm - She was referral to GI but does not feel it was satisfactory, she continues to have pain and is req a call from MD. Initial call taken by: Lamar Sprinkles, CMA,  October 05, 2010 5:03 PM  Follow-up for Phone Call        Pt has continued abd discomfort and is unhappy w/GI workup. She has changed her diet and contiues to loose weight. Discussed w/MD  and spoke w/pt. She is comming in today for f/u office visit for futher eval Follow-up by: Lamar Sprinkles, CMA,  October 06, 2010 11:28 AM

## 2011-01-09 NOTE — Progress Notes (Signed)
Summary: Dexilant samples  Phone Note Call from Patient   Summary of Call: Pt would like additional samples to get her through to appointment with Dr. Leone Payor.  Pt states she is taking Dexilant as Lansoprazole did not work well.  Advised pt that I would put additional samples at front desk for her. Initial call taken by: Francee Piccolo CMA Duncan Dull),  Apr 21, 2010 10:21 AM    New/Updated Medications: DEXILANT 30 MG CPDR (DEXLANSOPRAZOLE) 1 by mouth each am 30 minutes before breakfast

## 2011-01-09 NOTE — Medication Information (Signed)
Summary: Prior [employer] for Dexilant/[employer]  Prior [employer] for Dexilant/[employer]   Imported By: Sherian Rein 05/19/2010 15:15:54  _____________________________________________________________________  External Attachment:    Type:   Image     Comment:   External Document

## 2011-01-09 NOTE — Progress Notes (Signed)
Summary: symptom update  Phone Note Call from Patient Call back at Home Phone [phone] Call back at [phone]   Caller: Patient Call For: Dr. Leone Payor Reason for Call: Talk to Nurse Summary of Call: Lansoprazole has helped with gas, but still having occasional abd pain Initial call taken by: Vallarie Mare,  April 06, 2010 1:15 PM  Follow-up for Phone Call        Patient with intermittent abdominal pain, but the gas and bloating has improved.  She has a follow up REV with Dr Leone Payor for 05/03/10.  Dexilant was great, but it was not covered by her insurance she is currently taking lansoprazole.  Marland Kitchen  Please advise if needs additional follow up prior to her appointment in yes Follow-up by: Darcey Nora RN, CGRN,  April 06, 2010 1:44 PM  Additional Follow-up for Phone Call Additional follow up Details #1::        If we can supply Dexilant 60 mg samples up to 1 months worth to take daily instead of lansoprazole til I see her If unable to do that could add Pepcid Complete 1-2 as needed abdominal pain instead (that is over the counter and may take up to 4/day) Iva Boop MD, [facility]  April 07, 2010 7:05 AM     Additional Follow-up for Phone Call Additional follow up Details #2::    Patient aware right now we have no Dexilant, she has found her missing samples and she will take them till gone  then she will begin on Lansoprazole and pepcid.  She is asked to keep her follow up appointment that is scheduled in June Follow-up by: Darcey Nora RN, CGRN,  April 07, 2010 8:38 AM

## 2011-01-09 NOTE — Assessment & Plan Note (Signed)
Summary: FOLLOW-UP FROM TRIAGE ON 07-24-10         [facility]   History of Present Illness Visit Type: Follow-up Visit Primary GI MD: Stan Head MD [facility] Primary Provider: Jacques Navy MD Chief Complaint: RLQ abdominal pain History of Present Illness:   Still with episodic right sided pain that is unpredictable and accompained by nause at times and she has vomited. Still accompanied by alot of burping. She experiences problems daily. Difficulty sleeping due to symptoms occasionally. weight down 10# since May. She is eating less due to fear of symptoms. She has been modifying her diet somewhat based upon an online IBS site. "Food combining". Has not necessarily helping since starting 4 days ago.   GI Review of Systems    Reports abdominal pain, belching, bloating, and  vomiting.     Location of  Abdominal pain: RLQ.    Denies acid reflux, chest pain, dysphagia with liquids, dysphagia with solids, heartburn, loss of appetite, nausea, vomiting blood, weight loss, and  weight gain.      Reports change in bowel habits and  diarrhea.     Denies anal fissure, black tarry stools, constipation, diverticulosis, fecal incontinence, heme positive stool, hemorrhoids, irritable bowel syndrome, jaundice, light color stool, liver problems, rectal bleeding, and  rectal pain.    Current Medications (verified): 1)  Levothroid 25 Mcg Tabs (Levothyroxine Sodium) .Marland Kitchen.. 1 By Mouth Once Daily 2)  Vit C .... Daily 3)  Calcium .... Daily 4)  Glucosamine .... Daily 5)  Colchicine .... Take By Mouth As Needed 6)  Estrace 7)  Digest Gold Digestive Enzymes .... Take 1 Tablet Before Meals 8)  Gas-X Ultra Strength 180 Mg Caps (Simethicone) .... As Needed 9)  Dexilant 60 Mg Cpdr (Dexlansoprazole) .Marland Kitchen.. 1 By Mouth Every Am 30 Minutes Before Breakfast 10)  Chinese Herb For Kidney Stones  Allergies (verified): 1)  ! Erythromycin 2)  ! Penicillin  Past History:  Past Medical History: Reviewed history from  05/03/2010 and no changes required. 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 Tubal ligation Bilateral arthroscopy of the knees in 1993 WISDOM TEETH EXTRACTION, HX OF (ICD-V15.9)  Family History: Reviewed history from 03/14/2010 and no changes required. rheumatism: : maternal grandmother cancer: maternal grandmother (colon)  Family History of Breast Cancer:Sister  Social History: Reviewed history from 03/14/2010 and no changes required. Patient never smoked.  pt is widowed. pt has 3 children. pt is a retired housewife.  Occupation: Retired widowed (husband was Chief Financial Officer) Alcohol Use - yes Daily Caffeine Use Illicit Drug Use - no  Vital Signs:  Patient profile:   70 year old female Height:      64 inches Weight:      125 pounds BMI:     21.53 Pulse rate:   68 / minute Pulse rhythm:   regular BP sitting:   100 / 62  (left arm)  Vitals Entered By: Milford Cage NCMA (August 10, 2010 2:07 PM)  Physical Exam  General:  WNWD, fit appearing white female in no distress Eyes:  anicteric Abdomen:  tender in multiple areas moreso in RLQ soft some mild involuntary guarding  BS+  Neurologic:  Alert and  oriented x3   Impression & Recommendations:  Problem # 1:  ABDOMINAL PAIN RIGHT LOWER QUADRANT (ICD-789.03)  Assessment Deteriorated seems to be main area of pain and tenderness mow though ahas had other sites probably IBS but weight loss is an issue ? overlap of psych issue?  Problem # 2:  DIARRHEA (ICD-787.91) Assessment: New ?IBS currently ? Crohn's with weght loss she should have colonoscopy but she wants to discuss withDr Rosalio Macadamia trial of  align for now ? celiac - chart review after she left indicates not tested for that will contact her to have TTG Ab abd IgA level  Problem # 3:  WEIGHT LOSS (ICD-783.21)  Patient Instructions: 1)  Please begin taking Align daily.  Sample provided. 2)  Please call back if you decide to have your colonoscopy. 3)  Copy sent to : Floyde Parkins, MD 4)  The medication list was reviewed and reconciled.  All changed / newly prescribed medications were explained.  A complete medication list was provided to the patient / caregiver.

## 2011-01-09 NOTE — Progress Notes (Signed)
Summary: Dexilant Approval  Phone Note From Pharmacy   Caller: [employer]  [phone] Call For: Dr. Leone Payor  Summary of Call: Rx Dexilant has been approved identifiantly as long as the pt. remains with the plan Initial call taken by: Karna Christmas,  May 17, 2010 12:16 PM  Follow-up for Phone Call        pharmacy notified. Follow-up by: Francee Piccolo CMA Duncan Dull),  May 17, 2010 3:00 PM

## 2011-02-27 LAB — COMPREHENSIVE METABOLIC PANEL
ALT: 20 U/L (ref 0–35)
Calcium: 9.3 mg/dL (ref 8.4–10.5)
Creatinine, Ser: 0.63 mg/dL (ref 0.4–1.2)
GFR calc Af Amer: >60 mL/min (ref >60)
GFR calc non Af Amer: >60 mL/min (ref >60)
Glucose, Bld: 102 mg/dL — ABNORMAL HIGH (ref 70–99)
Sodium: 136 mEq/L (ref 135–145)
Total Protein: 7.3 g/dL (ref 6.0–8.3)

## 2011-02-27 LAB — DIFFERENTIAL
Eosinophils Absolute: 0 10*3/uL (ref 0.0–0.7)
Lymphocytes Relative: 37 % (ref 12–46)
Lymphs Abs: 1.8 10*3/uL (ref 0.7–4.0)
Monocytes Relative: 8 % (ref 3–12)
Neutro Abs: 2.7 10*3/uL (ref 1.7–7.7)
Neutrophils Relative %: 55 % (ref 43–77)

## 2011-02-27 LAB — URINALYSIS, ROUTINE W REFLEX MICROSCOPIC
Nitrite: NEGATIVE
Specific Gravity, Urine: 1.009 (ref 1.005–1.030)
Urobilinogen, UA: 0.2 mg/dL (ref 0.0–1.0)
pH: 7.5 (ref 5.0–8.0)

## 2011-02-27 LAB — URINE MICROSCOPIC-ADD ON

## 2011-02-27 LAB — URINE CULTURE

## 2011-02-27 LAB — CBC
Hemoglobin: 13.6 g/dL (ref 12.0–15.0)
MCHC: 33.6 g/dL (ref 30.0–36.0)
MCV: 97.4 fL (ref 78.0–100.0)
RDW: 12.5 % (ref 11.5–15.5)

## 2011-02-27 LAB — LIPASE, BLOOD: Lipase: 42 U/L (ref 11–59)

## 2011-03-05 LAB — COMPREHENSIVE METABOLIC PANEL
ALT: 18 U/L (ref 0–35)
Alkaline Phosphatase: 49 U/L (ref 39–117)
CO2: 26 mEq/L (ref 19–32)
Chloride: 103 mEq/L (ref 96–112)
Glucose, Bld: 105 mg/dL — ABNORMAL HIGH (ref 70–99)
Potassium: 3.5 mEq/L (ref 3.5–5.1)
Sodium: 133 mEq/L — ABNORMAL LOW (ref 135–145)
Total Protein: 6.9 g/dL (ref 6.0–8.3)

## 2011-03-05 LAB — DIFFERENTIAL
Basophils Relative: 0 % (ref 0–1)
Eosinophils Absolute: 0 10*3/uL (ref 0.0–0.7)
Monocytes Relative: 8 % (ref 3–12)
Neutrophils Relative %: 54 % (ref 43–77)

## 2011-03-05 LAB — CBC
Hemoglobin: 13.3 g/dL (ref 12.0–15.0)
RBC: 3.87 MIL/uL (ref 3.87–5.11)
WBC: 3.7 10*3/uL — ABNORMAL LOW (ref 4.0–10.5)

## 2011-03-05 LAB — POCT CARDIAC MARKERS
CKMB, poc: 1.7 ng/mL (ref 1.0–8.0)
CKMB, poc: 3.9 ng/mL (ref 1.0–8.0)
Troponin i, poc: <0.05 ng/mL (ref 0.00–0.09)

## 2011-03-05 LAB — MAGNESIUM: Magnesium: 1.9 mg/dL (ref 1.5–2.5)

## 2011-03-05 LAB — TSH: TSH: 2.383 u[IU]/mL (ref 0.350–4.500)

## 2011-04-02 ENCOUNTER — Telehealth: Payer: Self-pay | Admitting: *Deleted

## 2011-04-02 MED ORDER — LEVOTHYROXINE SODIUM 25 MCG PO TABS: 25.0000 ug | ORAL_TABLET | Freq: Every day | ORAL | Status: AC

## 2011-04-02 NOTE — Telephone Encounter (Signed)
refill 

## 2011-04-27 NOTE — Procedures (Signed)
[name], Linda Macdonald             ACCOUNT NO.:  [id number]   MEDICAL RECORD NO.:  [id number]          PATIENT TYPE:  OUT   LOCATION:  SLEEP CENTER                 FACILITY[facility]  [facility]   PHYSICIAN:  Barbaraann Share, MD,FCCPDATE OF BIRTH:  05-01-42   DATE OF STUDY:  10/16/2006                              NOCTURNAL POLYSOMNOGRAM   INDICATION FOR STUDY:  Hypersomnia with sleep apnea.  The patient returns  for CPAP titration/optimization.   EPWORTH SLEEPINESS SCORE:  16.   SLEEP ARCHITECTURE:  The patient had total sleep time of 366 minutes with  adequate slow wave sleep for her age and mildly decreased REM.  Sleep onset  latency was normal, as was REM onset.  Sleep efficiency was fairly good at  93%.   RESPIRATORY DATA:  The patient was placed on a petite comfort gel  Respironics' nasal mask in preparation for CPAP titration.  The pressure was  increased incrementally and seemed to have the best control at a final CPAP  pressure of 12 cm of water.  Supine REM was achieved during this time.   OXYGEN DATA:  There was oxygen saturation's as low as 89% earlier in the  study, prior to optimal pressure being achieved.   CARDIAC DATA:  No arrhythmias were noted.   MOVEMENT-PARASOMNIA:  The patient was found to have 70 leg jerks with one  per hour resulting in arousal or awakening.   IMPRESSIONS-RECOMMENDATIONS:  1. Good control of previously documented obstructive sleep apnea with 12      cm of CPAP pressure.  The patient used a petite Respironics' comfort      gel nasal mask for the study.  2. Small to moderate numbers of leg jerks with very sleep disruption      noted.      Barbaraann Share, MD,FCCP  Diplomate, American Board of Sleep  Medicine     [facility]/MEDQ  D:  10/28/2006 12:35:05  T:  10/28/2006 15:31:38  Job:  [id number]

## 2011-04-27 NOTE — Op Note (Signed)
[facility]  Patient:    Linda Macdonald, Linda Macdonald                    MRN: [id number] Proc. Date: 10/29/00 Adm. Date:  [id number] Attending:  Cathren Laine                           Operative Report  PREOPERATIVE DIAGNOSIS:  A 1.5 cm laceration nasal bridge.  POSTOPERATIVE DIAGNOSIS:  A 1.5 cm laceration nasal bridge.  OPERATION PERFORMED:  Closure of nasal bridge laceration.  SURGEON:  Alfredia Ferguson, M.D.  ANESTHESIA:  1% Xylocaine 1:100,000 epinephrine.  INDICATIONS FOR PROCEDURE:  This is a 70 year old woman who had the top of her garbage can come down on top of her nose creating a flap of skin which was inferiorly based.  It was a U-shaped laceration.  The patient requested plastic surgery services for closure of the wound.  She understands that it will leave a scar.  In spite of that, she wishes to proceed.  DESCRIPTION OF PROCEDURE:  1% Xylocaine was infiltrated in the area of the laceration and the midface was prepped and draped in sterile fashion.  The laceration was irrigated with saline irrigation.  No foreign bodies were seen in the wound.  The flap was placed back in its anatomic position.  It was tacked in position using multiple interrupted 6-0 nylon sutures.  A light dressing was applied.  The patient was discharged home in satisfactory condition.  She was given a tetanus shot prior to discharge. DD:  10/29/00 TD:  10/30/00 Job: [id number]

## 2011-04-27 NOTE — Procedures (Signed)
Linda Macdonald, Linda Macdonald             ACCOUNT NO.:  [id number]   MEDICAL RECORD NO.:  [id number]          PATIENT TYPE:  OUT   LOCATION:  SLEEP CENTER                 FACILITY:  [facility]   PHYSICIAN:  Marcelyn Bruins, M.D. [facility] DATE OF BIRTH:  August 30, 1941   DATE OF STUDY:  05/10/2005                              NOCTURNAL POLYSOMNOGRAM   REFERRING PHYSICIAN:  Illene Regulus, MD   INDICATION FOR THE STUDY:  Persistent disorder of initiating or maintaining  sleep. Epworth score: 17.   SLEEP ARCHITECTURE:  The patient had a total sleep time of 379 minutes with  adequate REM and slow wave sleep. Sleep onset latency was normal as was REM  onset. Sleep efficiency was 92%.   IMPRESSION:  1.  Moderate obstructive sleep apnea/hypopnea syndrome with a respiratory      disturbance index of 21 events per hour and O2 desaturation as low as      88%. Events were not positional. Treatment of this degree of sleep apnea      can include weight loss, upper airway surgery, oral appliance and CPAP.      Clinical correlation is suggested.  2.  Moderate to loud snoring noted throughout.  3.  Occasional premature ventricular complexes.  4.  Very large numbers of leg jerks with significant sleep disruption. It is      unclear whether this is associated with her obstructive sleep apnea or      whether this is an entirely separate entity. Does the patient have a      history consistent with the restless leg syndrome?     ______________________________  Suzzette Righter    KC/MEDQ  D:  05/21/2005 16:03:07  T:  05/21/2005 17:38:26  Job:  [id number]

## 2011-04-27 NOTE — Letter (Signed)
November 01, 2006    Suzanna Obey, M.D.  [address] [place], [place] [address]   RE:  Linda Macdonald, Linda Macdonald  MRN:  [id number]  /  DOB:  [birth year]   Dear Jonny Ruiz:   Thank you very much for seeing [name] for evaluation of any possible  anatomic obstruction leading to her sleep apnea.   [name] does have a longstanding problem with sleep difficulty.  She has  had sleep study in the past, and has had consultation with the Sleep Study  Staff, Dr. Fannie Knee.  His impression was obstructive sleep apnea with a  respiratory disturbance index of 21 along with restless leg periodic limb  movement disorder leading to excessive daytime sleepiness.   I have enclosed my most recent office note from February 07, 2006, Dr. Roxy Cedar  note from July 20, 2005, and my baseline complete H&P on [name] from  January 18, 2003.  Also, enclosed is a copy of her most recent sleep staging  data performed at the Sleep Center on October 16, 2006.   If I can provide any additional information, please do not hesitate to  contact me.  I appreciate your assistance in the evaluation of this very  nice patient.    Sincerely,      Rosalyn Gess. Norins, MD  Electronically Signed   MEN/MedQ  DD: 11/01/2006  DT: 11/01/2006  Job #: [id number]

## 2012-05-07 ENCOUNTER — Encounter: Payer: Self-pay | Admitting: Internal Medicine

## 2012-06-09 DIAGNOSIS — H43811 Vitreous degeneration, right eye: Secondary | ICD-10-CM | POA: Insufficient documentation

## 2012-06-16 DIAGNOSIS — H20019 Primary iridocyclitis, unspecified eye: Secondary | ICD-10-CM | POA: Insufficient documentation

## 2012-07-25 ENCOUNTER — Telehealth: Payer: Self-pay | Admitting: Oncology

## 2012-07-25 NOTE — Telephone Encounter (Signed)
S/w pt re appt 9/4 @ 3 w/Dr. Ottis Stain

## 2012-07-28 ENCOUNTER — Telehealth: Payer: Self-pay | Admitting: Oncology

## 2012-07-28 NOTE — Telephone Encounter (Signed)
C/D on 8/19 for appt on 9/4.

## 2012-08-12 ENCOUNTER — Encounter: Payer: Self-pay | Admitting: Oncology

## 2012-08-12 ENCOUNTER — Other Ambulatory Visit: Payer: Self-pay | Admitting: Oncology

## 2012-08-12 DIAGNOSIS — D72819 Decreased white blood cell count, unspecified: Secondary | ICD-10-CM | POA: Insufficient documentation

## 2012-08-12 HISTORY — DX: Decreased white blood cell count, unspecified: D72.819

## 2012-08-13 ENCOUNTER — Encounter: Payer: Self-pay | Admitting: Oncology

## 2012-08-13 ENCOUNTER — Ambulatory Visit: Payer: Self-pay

## 2012-08-13 ENCOUNTER — Other Ambulatory Visit: Payer: Self-pay | Admitting: Lab

## 2012-10-16 ENCOUNTER — Ambulatory Visit (INDEPENDENT_AMBULATORY_CARE_PROVIDER_SITE_OTHER): Payer: BC Managed Care – PPO | Admitting: Sports Medicine

## 2012-10-16 VITALS — BP 100/50 | Ht 64.0 in | Wt 135.0 lb

## 2012-10-16 DIAGNOSIS — S83242A Other tear of medial meniscus, current injury, left knee, initial encounter: Secondary | ICD-10-CM

## 2012-10-16 DIAGNOSIS — IMO0002 Reserved for concepts with insufficient information to code with codable children: Secondary | ICD-10-CM

## 2012-10-16 NOTE — Patient Instructions (Addendum)
Thank you for coming in today. I think you have a medial mensicus injury.  This would explain your effusion.  Try the compression sleeve.  Ice your knee Ibuprofen 600mg  every 6 hours for pain Come back Tuesday

## 2012-10-16 NOTE — Progress Notes (Signed)
Linda Macdonald is a 71 y.o. female who presents to [facility] today for left knee pain and swelling.   Present since Sunday. She rolled over in bed on Sunday and experienced sharp acute onset of pain. She notes swelling and inability to extend her knee fully. She was seen by a PT who did simple traction which helped well. She denies any fevers, chills radiating pain. No other injury.    PMH reviewed. 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  History  Substance Use Topics  . Smoking status: Not on file  . Smokeless tobacco: Not on file  . Alcohol Use: Not on file   ROS as above otherwise neg   Exam:  BP 100/50  Ht 5\' 4"  (1.626 m)  Wt 135 lb (61.236 kg)  BMI 23.17 kg/m2 Gen: Well NAD MSK: Left knee:  Large effusion Otherwise no erythremia.  ROM 10-100 deg TTP medial joint line Mild pain with valgus stress. Normal Varus Positive medial mcmurrey Mildly lax lachmans.  No patella apprehension test.   Neuro Vasc:  Distal sensation, and cap refill intact BL.   Gait: Walks with a knee flexed to 15deg  MSK [place] Left knee:  Normal QT.  Large superpatella effusion Normal PT Med Men: Large longitudinal split with increased doppler activity Lat Men: Normal appearing

## 2012-10-16 NOTE — Assessment & Plan Note (Signed)
Large meniscus tear visualized on ultrasound this is likely the explanation of her effusion. She may additionally have pseudogout contributing to effusion.  We discussed options including aspiration and injection up to referral to orthopedic surgery. Patient elects for trial of compression knee sleeve ice anti-inflammatories and followup on Tuesday. Discussed warning signs or symptoms. Please see discharge instructions. Patient expresses understanding.

## 2012-10-21 ENCOUNTER — Encounter: Payer: Self-pay | Admitting: Sports Medicine

## 2012-10-21 ENCOUNTER — Ambulatory Visit (INDEPENDENT_AMBULATORY_CARE_PROVIDER_SITE_OTHER): Payer: BC Managed Care – PPO | Admitting: Sports Medicine

## 2012-10-21 VITALS — BP 111/66 | HR 78

## 2012-10-21 DIAGNOSIS — IMO0002 Reserved for concepts with insufficient information to code with codable children: Secondary | ICD-10-CM

## 2012-10-21 DIAGNOSIS — S83242A Other tear of medial meniscus, current injury, left knee, initial encounter: Secondary | ICD-10-CM

## 2012-10-21 NOTE — Assessment & Plan Note (Addendum)
Improving L medial meniscus degenerative tear with effusion.  P:   Continue to wear body helix and temporary heel insert to minimize knee flexion when walking.  Exercises: -isometric quadriceps tightening. -straight leg raises.   Avoid all strength exercises now and in the future that require >30 degrees of knee flexion.   Keep working at getting full extension and using ice frequently  F/u in 2 weeks.

## 2012-10-21 NOTE — Progress Notes (Signed)
Patient ID: Linda Macdonald, female   DOB: [birth year], 72 y.o.   MRN: [id number] S:  71 yo F presents for f/u of L knee swelling and decreased ROM. She reports improved swelling. She denies pain. She is wearing the body helix sleeve on her L knee. She has gone to PT for massage and traction which has helped.  The compression sleeve has really decreased the swelling and she feels more stable with this.  Able to walk with less pain.  Uses cane if up too long.  O:  BP 111/66  Pulse 78 General appearance: alert, cooperative and no distress MSK: Left knee:  Medium effusion anterior and in the posterior pouch.  No erythema.  ROM 5-110 deg, able to extend down to 3 degrees after applying traction This compares to RT of 0 - 135 Flex  TTP medial joint line  Mild pain with valgus stress. Normal varus.  Positive medial McMurray.   Gait: Walks with knee flexed to 10 deg Degree of flexion minimized by heel insert.   A/P: Improving L medial meniscus degenerative tear with effusion.  P:   Continue to wear body helix and temporary heel insert to minimize knee flexion when walking.  Exercises: -isometric quadriceps tightening. -straight leg raises.   Avoid all exercises now and in the future that require >30 degrees of knee flexion.   F/u in 2 weeks.

## 2012-10-22 DIAGNOSIS — S83249A Other tear of medial meniscus, current injury, unspecified knee, initial encounter: Secondary | ICD-10-CM | POA: Insufficient documentation

## 2012-10-22 DIAGNOSIS — M1120 Other chondrocalcinosis, unspecified site: Secondary | ICD-10-CM | POA: Insufficient documentation

## 2012-10-22 DIAGNOSIS — I824Z9 Acute embolism and thrombosis of unspecified deep veins of unspecified distal lower extremity: Secondary | ICD-10-CM | POA: Insufficient documentation

## 2012-11-13 ENCOUNTER — Ambulatory Visit (INDEPENDENT_AMBULATORY_CARE_PROVIDER_SITE_OTHER): Payer: BC Managed Care – PPO | Admitting: Sports Medicine

## 2012-11-13 ENCOUNTER — Encounter: Payer: Self-pay | Admitting: Sports Medicine

## 2012-11-13 VITALS — BP 117/73 | HR 67 | Ht 64.0 in | Wt 135.0 lb

## 2012-11-13 DIAGNOSIS — S83242A Other tear of medial meniscus, current injury, left knee, initial encounter: Secondary | ICD-10-CM

## 2012-11-13 DIAGNOSIS — IMO0002 Reserved for concepts with insufficient information to code with codable children: Secondary | ICD-10-CM

## 2012-11-13 NOTE — Assessment & Plan Note (Signed)
This is. much improved  Continue some home exercises but it is okay to go to dance class and do other activities Be cautious  with any rotation of the left knee  Continue using compression sleeve for the next 3 months  She can recheck with me as needed

## 2012-11-13 NOTE — Progress Notes (Signed)
Patient ID: Linda Macdonald, female   DOB: 08-04-1941, 71 y.o.   MRN: [id number]  Patient returns for followup of an acute medial meniscus tear in her left knee We felt this was likely to be a degenerative tear but it did cause a lot of swelling She used a compression sleeve and to physical therapy She continues making great progress  Now she says her pain level is back to 0 No recent swelling She was able to do ballroom dancing class this week  Physical examination Left knee now has full range of motion No visible effusion Stable ligaments On shake home test she lacks about 2 to 3 from full extension when compared to the right leg McMurray's test is non-painful Walking gait without limp  Musculoskeletal ultrasound Suprapatellar pouch effusion that was visualized before is now down to normal size Patellar and quadriceps tendons are normal Midline tear and medial meniscus is now calcified and there is no excess fluid around the meniscus

## 2013-03-12 ENCOUNTER — Encounter: Payer: Self-pay | Admitting: Internal Medicine

## 2013-07-03 ENCOUNTER — Ambulatory Visit (INDEPENDENT_AMBULATORY_CARE_PROVIDER_SITE_OTHER): Payer: Medicare Other | Admitting: Internal Medicine

## 2013-07-03 VITALS — BP 108/60 | HR 75 | Temp 98.2°F | Resp 18 | Ht 63.5 in | Wt 146.0 lb

## 2013-07-03 DIAGNOSIS — M2550 Pain in unspecified joint: Secondary | ICD-10-CM

## 2013-07-03 DIAGNOSIS — R51 Headache: Secondary | ICD-10-CM

## 2013-07-03 DIAGNOSIS — W57XXXA Bitten or stung by nonvenomous insect and other nonvenomous arthropods, initial encounter: Secondary | ICD-10-CM

## 2013-07-03 LAB — POCT CBC
Granulocyte percent: 62.6 %G (ref 37–80)
MID (cbc): 0.3 (ref 0–0.9)
MPV: 9.5 fL (ref 0–99.8)
POC Granulocyte: 2.7 (ref 2–6.9)
POC LYMPH PERCENT: 30.9 %L (ref 10–50)
POC MID %: 6.5 %M (ref 0–12)
Platelet Count, POC: 213 10*3/uL (ref 142–424)
RDW, POC: 13.1 %

## 2013-07-03 MED ORDER — DOXYCYCLINE HYCLATE 100 MG PO TABS: 100.0000 mg | ORAL_TABLET | Freq: Two times a day (BID) | ORAL | Status: DC

## 2013-07-03 MED ORDER — VALACYCLOVIR HCL 1 G PO TABS: 1000.0000 mg | ORAL_TABLET | Freq: Three times a day (TID) | ORAL | Status: DC

## 2013-07-03 MED ORDER — PREDNISONE 20 MG PO TABS: ORAL_TABLET | ORAL | Status: DC

## 2013-07-03 NOTE — Progress Notes (Signed)
  Subjective:    Patient ID: Linda Macdonald, female    DOB: 02-07-1941, 72 y.o.   MRN: [id number]  HPI concern about headache that has been present for 5 days. Bifrontal headache with radiation to the occiput. Head feels heavy with pressure. Worse in dependent positions. No visual changes. No nausea or vomiting. Not dizzy.Marland Kitchen History of tick bite 2-1/2 weeks the on right side of the abdomen which has resolved without further rash. Another person with her at the time of her to buy has erythema marginata him and has been started on treatment for Lyme disease. She denies fever or rash. She has noted night sweats occasionally. She has joint pain consistent with her age that is long-standing.    Review of Systems  Constitutional: Negative for activity change, appetite change, fatigue and unexpected weight change.  HENT: Negative for hearing loss.   Eyes: Negative for visual disturbance.  Respiratory: Negative for chest tightness and shortness of breath.   Cardiovascular: Negative for palpitations.  Musculoskeletal: Negative for myalgias, back pain and arthralgias.  Skin: Negative for rash.  Psychiatric/Behavioral: Negative for decreased concentration.       Objective:   Physical Exam BP 108/60  Pulse 75  Temp(Src) 98.2 F (36.8 C) (Oral)  Resp 18  Ht 5' 3.5" (1.613 m)  Wt 146 lb (66.225 kg)  BMI 25.45 kg/m2  SpO2 99% Pupils equal round reactive to light and accommodation with normal extraocular movements. Conjunctiva clear TMs clear/nares clear/throat clear/no nodes or thyromegaly Tender with palpation of the occiput and posterior cervical muscles/neck range of motion good Heart regular without murmur click Lungs clear Cranial nerves II through XII intact Deep tendon reflexes symmetrical Gait normal Cerebellar negative No peripheral sensory and or motor losses      Results for orders placed in visit on 07/03/13  POCT CBC      Result Value Range   WBC 4.3 (*) 4.6 - 10.2 K/uL    Lymph, poc 1.3  0.6 - 3.4   POC LYMPH PERCENT 30.9  10 - 50 %L   MID (cbc) 0.3  0 - 0.9   POC MID % 6.5  0 - 12 %M   POC Granulocyte 2.7  2 - 6.9   Granulocyte percent 62.6  37 - 80 %G   RBC 4.46  4.04 - 5.48 M/uL   Hemoglobin 14.8  12.2 - 16.2 g/dL   HCT, POC 40.9  81.1 - 47.9 %   MCV 101.5 (*) 80 - 97 fL   MCH, POC 33.2 (*) 27 - 31.2 pg   MCHC 32.7  31.8 - 35.4 g/dL   RDW, POC 91.4     Platelet Count, POC 213  142 - 424 K/uL   MPV 9.5  0 - 99.8 fL    Assessment & Plan:  Headache(784.0)  Tick bite - Plan: POCT CBC, B. burgdorfi antibodies by WB, Rocky mtn spotted fvr ab, IgM-blood  Arthralgia -probably due to H.  Cover with doxycycline Call with side effects

## 2013-07-05 ENCOUNTER — Telehealth: Payer: Self-pay

## 2013-07-05 NOTE — Telephone Encounter (Signed)
Please advise 

## 2013-07-05 NOTE — Telephone Encounter (Signed)
Patient saw Merla Riches on Friday and is being treated for lime disease.  Says she still has a terrible headache and wants to know what is best to take for this.  Says she has taken Tylenol, but it hasn't helped.  Please call [phone]

## 2013-07-06 ENCOUNTER — Telehealth: Payer: Self-pay

## 2013-07-06 NOTE — Telephone Encounter (Signed)
Glad to

## 2013-07-06 NOTE — Telephone Encounter (Signed)
Try 800 ibuprofen and if no change by Tuesday can see her to recheck at 4pm

## 2013-07-06 NOTE — Telephone Encounter (Signed)
Can we provide the letter? If you approve, I will type for you.

## 2013-07-06 NOTE — Telephone Encounter (Signed)
Pt states she was diagnosed with lyme disease and had a trip planned already with hotel included,does not feel well enough to go on trip and will need A letter stating her diagnosis/etc to have money refunded for hotel.   Best phone [phone]

## 2013-07-06 NOTE — Telephone Encounter (Signed)
Patient advised.

## 2013-07-07 ENCOUNTER — Encounter: Payer: Self-pay | Admitting: Internal Medicine

## 2013-07-07 ENCOUNTER — Ambulatory Visit (INDEPENDENT_AMBULATORY_CARE_PROVIDER_SITE_OTHER): Payer: Medicare Other | Admitting: Internal Medicine

## 2013-07-07 ENCOUNTER — Telehealth: Payer: Self-pay

## 2013-07-07 VITALS — BP 122/60 | HR 74 | Temp 98.0°F | Resp 16 | Ht 64.0 in | Wt 145.0 lb

## 2013-07-07 DIAGNOSIS — R51 Headache: Secondary | ICD-10-CM

## 2013-07-07 LAB — B. BURGDORFI ANTIBODIES BY WB
B burgdorferi IgG Abs (IB): NEGATIVE
B burgdorferi IgM Abs (IB): NEGATIVE

## 2013-07-07 MED ORDER — CYCLOBENZAPRINE HCL 10 MG PO TABS: 10.0000 mg | ORAL_TABLET | Freq: Every day | ORAL | Status: DC

## 2013-07-07 NOTE — Telephone Encounter (Signed)
Patient is being treated for Lyme disease.  She reports she still has a headache.  [phone]

## 2013-07-07 NOTE — Telephone Encounter (Signed)
If headache not improving, Dr Merla Riches wanted her to return to clinic, I have called her to advise. Left message for her to come in AND to call me back, so I know she got my message. She called me back immediately and advised she is coming today, I wanted to have you look for her when you get here

## 2013-07-07 NOTE — Progress Notes (Signed)
  Subjective:    Patient ID: Linda Macdonald, female    DOB: 03-May-1941, 72 y.o.   MRN: [id number]  HPI returns because headache is not resolved She is better/no fever/no rash/activity and appetite better Headache continues to feel generalized like a light pressure which is worse with some dependent positions Her neck feels tight as well it is difficult to get up from sitting  No other neurological symptoms She does get some improvement with ibuprofen but she is a reluctant medicine taker   Review of Systems No change from last visit    Objective:   Physical Exam BP 122/60  Pulse 74  Temp(Src) 98 F (36.7 C) (Oral)  Resp 16  Ht 5\' 4"  (1.626 m)  Wt 145 lb (65.772 kg)  BMI 24.88 kg/m2  SpO2 98% Pupils equal round reactive to light and accommodations/EOMs conjugate ENT clear Neck actually has a fair range of motion without stiffness though there is mild tenderness No respiratory trouble/heart regular Skin clear Cranial nerves II through XII intact Gait normal       Assessment & Plan:  Continued headache and malaise with recent history of tick bite/continues to Doxy  Will add Flexeril at bedtime and continue to follow symptoms Further intervention is now well within 2 weeks

## 2013-07-07 NOTE — Telephone Encounter (Signed)
Thank you, printed letter you have put in and advised her it is ready for pick up

## 2013-07-10 NOTE — Progress Notes (Signed)
PA approved for flexeril through 07/08/14. Pharm notified.

## 2014-03-12 ENCOUNTER — Ambulatory Visit (INDEPENDENT_AMBULATORY_CARE_PROVIDER_SITE_OTHER): Payer: Medicare Other | Admitting: Internal Medicine

## 2014-03-12 VITALS — BP 120/64 | HR 66 | Temp 97.6°F | Resp 18 | Ht 64.0 in | Wt 151.0 lb

## 2014-03-12 DIAGNOSIS — R209 Unspecified disturbances of skin sensation: Secondary | ICD-10-CM

## 2014-03-12 DIAGNOSIS — R202 Paresthesia of skin: Secondary | ICD-10-CM

## 2014-03-12 DIAGNOSIS — R635 Abnormal weight gain: Secondary | ICD-10-CM

## 2014-03-12 DIAGNOSIS — L659 Nonscarring hair loss, unspecified: Secondary | ICD-10-CM

## 2014-03-12 DIAGNOSIS — G4700 Insomnia, unspecified: Secondary | ICD-10-CM

## 2014-03-12 LAB — COMPREHENSIVE METABOLIC PANEL
ALBUMIN: 4.4 g/dL (ref 3.5–5.2)
ALK PHOS: 60 U/L (ref 39–117)
ALT: 22 U/L (ref 0–35)
AST: 26 U/L (ref 0–37)
BUN: 17 mg/dL (ref 6–23)
CHLORIDE: 101 meq/L (ref 96–112)
CO2: 26 meq/L (ref 19–32)
Calcium: 10.3 mg/dL (ref 8.4–10.5)
Creat: 0.71 mg/dL (ref 0.50–1.10)
GLUCOSE: 93 mg/dL (ref 70–99)
Potassium: 4.9 mEq/L (ref 3.5–5.3)
Sodium: 137 mEq/L (ref 135–145)
TOTAL PROTEIN: 7.4 g/dL (ref 6.0–8.3)
Total Bilirubin: 0.6 mg/dL (ref 0.2–1.2)

## 2014-03-12 LAB — POCT CBC
Granulocyte percent: 62.4 %G (ref 37–80)
HCT, POC: 45.6 % (ref 37.7–47.9)
Hemoglobin: 15 g/dL (ref 12.2–16.2)
LYMPH, POC: 1.4 (ref 0.6–3.4)
MCH: 32.5 pg — AB (ref 27–31.2)
MCHC: 32.9 g/dL (ref 31.8–35.4)
MCV: 98.9 fL — AB (ref 80–97)
MID (CBC): 0.3 (ref 0–0.9)
MPV: 8.9 fL (ref 0–99.8)
PLATELET COUNT, POC: 238 10*3/uL (ref 142–424)
POC Granulocyte: 2.8 (ref 2–6.9)
POC LYMPH %: 31.2 % (ref 10–50)
POC MID %: 6.4 % (ref 0–12)
RBC: 4.61 M/uL (ref 4.04–5.48)
RDW, POC: 13.3 %
WBC: 4.5 10*3/uL — AB (ref 4.6–10.2)

## 2014-03-12 LAB — POCT SEDIMENTATION RATE: POCT SED RATE: 16 mm/hr (ref 0–22)

## 2014-03-12 LAB — FOLATE: Folate: >20 ng/mL

## 2014-03-12 LAB — TSH: TSH: 1.498 u[IU]/mL (ref 0.350–4.500)

## 2014-03-12 LAB — VITAMIN B12: VITAMIN B 12: 573 pg/mL (ref 211–911)

## 2014-03-12 NOTE — Progress Notes (Signed)
Subjective:    Patient ID: Linda Macdonald, female    DOB: 11/12/1941, 73 y.o.   MRN: [id number] This chart was scribed for Linda Sia, MD by Nicholos Johns, Medical Scribe. This patient's care was started at 12:20 PM.  HPI HPI Comments: Linda Macdonald is a 73 y.o. female who presents to the [facility] complaining of a constant generalized tingling that inititially  started in the feet and is slowly moving up, unsure of initial onset. Is now feeling it in the hands.  Probably over 6 mos. Also feeling more fatigued because tingling is interfering with sleep at night. States she is usually able to fall asleep right away upon laying down but once woken up in the middle of the night says it is difficult to fall back asleep. Believes it is effecting her gate as well; has been feeling unsteady lately. Says she has been taking a dance class and notices it is effecting that. Hair has been falling out and nails feel and look different. Has gained some weight and states she cannot get rid of it. Usually around 135 lbs, today states she is about 150. Pt is regularly active. Previous thyroid tests have been coming back normal. Does not effect driving. Father died in 41 and had a Vitamin B deficiency that resulted in some neurological deficit. Denies Visual disturbances,   Patient Active Problem List   Diagnosis Date Noted  . Acute medial meniscus tear of left knee 10/16/2012  . Leukopenia 08/12/2012  . CALCULUS OF KIDNEY 10/06/2010  . WEIGHT LOSS 08/10/2010  . DIARRHEA 08/10/2010  . ABDOMINAL PAIN RIGHT LOWER QUADRANT 08/10/2010  . ERUCTATION 04/18/2010  . HYPERSOMNIA 01/24/2010  . 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 VENOUS INSUFFICIENCY, LEGS 08/29/2009  . HYPOTHYROIDISM--- has been laboratory normal but suspicious of underfunctioning for many years ///started on low-dose Synthroid by prior physician  04/21/2008  . DEPRESSION 04/21/2008  . SLEEP APNEA,  OBSTRUCTIVE 04/21/2008  . DEEP VENOUS THROMBOPHLEBITIS, LEFT, LEG, HX OF 04/21/2008  . WISDOM TEETH EXTRACTION, HX OF 04/21/2008   Current outpatient prescriptions:levothyroxine (LEVOTHROID) 25 MCG tablet, Take 1 tablet (25 mcg total) by mouth daily.,  cyclobenzaprine (FLEXERIL) 10 MG tablet, Take 1 tablet (10 mg total) by mouth at bedtime. For neck spasm, Disp: 30 tablet,     Review of Systems  Constitutional: Positive for fatigue. Negative for activity change, appetite change and unexpected weight change.  Eyes: Negative for visual disturbance.  Respiratory: Negative for shortness of breath.   Cardiovascular: Negative for chest pain, palpitations and leg swelling.  Genitourinary: Negative for difficulty urinating.  Neurological: Negative for weakness and headaches.  Psychiatric/Behavioral: Positive for sleep disturbance. Negative for behavioral problems.       Worries about memory struggles at times   Objective:  Physical Exam  Vitals reviewed. Constitutional: She is oriented to person, place, and time. She appears well-developed and well-nourished. No distress.  HENT:  Head: Normocephalic and atraumatic.  Eyes: Conjunctivae and EOM are normal. Pupils are equal, round, and reactive to light.  Neck: Normal range of motion. Neck supple. No thyromegaly present.  Cardiovascular: Normal rate, regular rhythm and normal heart sounds.   No murmur heard. Pulmonary/Chest: Effort normal and breath sounds normal. No respiratory distress. She has no wheezes.  Musculoskeletal: Normal range of motion. She exhibits no edema.  Lymphadenopathy:    She has no cervical adenopathy.  Neurological: She is alert and oriented to person, place, and time. She has normal  reflexes. No cranial nerve deficit. She exhibits normal muscle tone. Coordination normal.  Skin: Skin is warm and dry.  Psychiatric: She has a normal mood and affect. Her behavior is normal. Judgment and thought content normal.   history of  depression but not active currently  Assessment & Plan:  I have completed the patient encounter in its entirety as documented by the scribe, with editing by me where necessary. Robert P. Merla Richesoolittle, M.D. Insomnia, unspecified - Plan: POCT CBC, Comprehensive metabolic panel, TSH, POCT SEDIMENTATION RATE  Paresthesia - Plan: Vitamin B12, Folate, POCT SEDIMENTATION RATE  Hair loss  Weight gain  This is a systemic problem with symmetrical symptoms Rule out metabolic causes If all normal will refer to neurology for investigation of sleep and perhaps peripheral nerve conduction studies

## 2014-03-17 NOTE — Addendum Note (Signed)
Addended by: Tonye PearsonOLITTLE, Calixto Pavel P on: 03/17/2014 01:06 PM   Modules accepted: Orders

## 2014-03-19 ENCOUNTER — Ambulatory Visit (INDEPENDENT_AMBULATORY_CARE_PROVIDER_SITE_OTHER): Payer: Medicare Other | Admitting: Neurology

## 2014-03-19 ENCOUNTER — Encounter: Payer: Self-pay | Admitting: Neurology

## 2014-03-19 ENCOUNTER — Encounter (INDEPENDENT_AMBULATORY_CARE_PROVIDER_SITE_OTHER): Payer: Self-pay

## 2014-03-19 VITALS — BP 94/54 | HR 65 | Resp 16 | Ht 64.0 in | Wt 151.0 lb

## 2014-03-19 DIAGNOSIS — D539 Nutritional anemia, unspecified: Secondary | ICD-10-CM

## 2014-03-19 DIAGNOSIS — R209 Unspecified disturbances of skin sensation: Secondary | ICD-10-CM

## 2014-03-19 DIAGNOSIS — G4710 Hypersomnia, unspecified: Secondary | ICD-10-CM | POA: Insufficient documentation

## 2014-03-19 DIAGNOSIS — G2581 Restless legs syndrome: Secondary | ICD-10-CM

## 2014-03-19 DIAGNOSIS — G4730 Sleep apnea, unspecified: Secondary | ICD-10-CM

## 2014-03-19 DIAGNOSIS — R208 Other disturbances of skin sensation: Secondary | ICD-10-CM | POA: Insufficient documentation

## 2014-03-19 NOTE — Patient Instructions (Signed)
Restless Legs Syndrome Restless legs syndrome is a movement disorder. It may also be called a sensori-motor disorder.  CAUSES  No one knows what specifically causes restless legs syndrome, but it tends to run in families. It is also more common in people with low iron, in pregnancy, in people who need dialysis, and those with nerve damage (neuropathy).Some medications may make restless legs syndrome worse.Those medications include drugs to treat high blood pressure, some heart conditions, nausea, colds, allergies, and depression. SYMPTOMS Symptoms include uncomfortable sensations in the legs. These leg sensations are worse during periods of inactivity or rest. They are also worse while sitting or lying down. Individuals that have the disorder describe sensations in the legs that feel like:  Pulling.  Drawing.  Crawling.  Worming.  Boring.  Tingling.  Pins and needles.  Prickling.  Pain. The sensations are usually accompanied by an overwhelming urge to move the legs. Sudden muscle jerks may also occur. Movement provides temporary relief from the discomfort. In rare cases, the arms may also be affected. Symptoms may interfere with going to sleep (sleep onset insomnia). Restless legs syndrome may also be related to periodic limb movement disorder (PLMD). PLMD is another more common motor disorder. It also causes interrupted sleep. The symptoms from PLMD usually occur most often when you are awake. TREATMENT  Treatment for restless legs syndrome is symptomatic. This means that the symptoms are treated.   Massage and cold compresses may provide temporary relief.  Walk, stretch, or take a cold or hot bath.  Get regular exercise and a good night's sleep.  Avoid caffeine, alcohol, nicotine, and medications that can make it worse.  Do activities that provide mental stimulation like discussions, needlework, and video games. These may be helpful if you are not able to walk or  stretch. Some medications are effective in relieving the symptoms. However, many of these medications have side effects. Ask your caregiver about medications that may help your symptoms. Correcting iron deficiency may improve symptoms for some patients. Document Released: 11/16/2002 Document Revised: 02/18/2012 Document Reviewed: 02/22/2011 Exit[employer] Patient Information 2014 [employer].  

## 2014-03-19 NOTE — Addendum Note (Signed)
Addended by: Melvyn NovasHMEIER, Austen Wygant on: 03/19/2014 02:44 PM   Modules accepted: Orders

## 2014-03-19 NOTE — Progress Notes (Addendum)
[facility]   Provider:  Melvyn Novas, [place]  Referring Provider: Tonye Pearson, MD Primary Care Physician:  Tonye Pearson, MD  Chief Complaint  Patient presents with  . New Evaluation    Room 11  . Sleep consult    HPI:  Linda Macdonald is a 73 y.o. female widow , and is seen here as a referral from Dr. Merla Riches for tingling dysesthesias.    Referred to [place] Caucasian, right-handed female for the evaluation of a constant sensation of tingling. This dysesthesia is started bilaterally  in her feet and is slowly ascending she is unsure at what time it initially set on. Onset well before 2  years ago , that she first noticed the onset -and for a long time she was fine as  it didn't interfere with her sleep-  it didn't affect her quality of life.  Then 2 years ago she began having trouble asleep at night , usually she would wake up, called the bathroom and right back to sleep, she  noted this  became more difficult due to the tingling , which was interfering with sleep.  At the same time she noticed some trouble with balance and fine motor skills especially during a dance class ,  she developed some clumsiness, some off balance feeling.  She stated  Her nails became brittle and ridged , she alsochanged her body weight at the time by 25 pound. At the time of her assessment by Dr. Merla Riches she  was 150 pounds .the patient continues to be physically active has no cognitive complications. Thyroid tests and vitamin D deficiencies have been ruled out Mrs. Thorner reported that her father died in 19 and after he had  pernicious anemia-vitamin B12 deficiency.  She sleeps by 10-10.30 PM , promptly after going to be and wakes regularly at 3.30 AM for a medication and bathroom break ,  the tingling is very disturbing. About 6.30 AM is her regular rise time. She doesn't no believe she snores, but has fallen asleep in a lecture ( than she was  witnessed to snore) , she gets sleepy and blurred and double vision when driving long distance and gets fatigued.  She wakes spontaneously. Has one cup of coffee in AM , non smoker , rare ETOH.    Review of Systems: Out of a complete 14 system review, the patient complains of only the following symptoms, and all other reviewed systems are negative. Creepy,  crawly and tingling sensation in feet and hands now, too, relief when massaging, rubbing or waking.  Epworth 15 , FSS 30 , GDS 2 points.   History   Social History  . Marital Status: Widowed    Spouse Name: N/A    Number of Children: 3  . Years of Education: College   Occupational History  . Not on file.   Social History Main Topics  . Smoking status: Never Smoker   . Smokeless tobacco: Never Used  . Alcohol Use: Yes     Comment: socially  . Drug Use: No  . Sexual Activity: Not on file   Other Topics Concern  . Not on file   Social History Narrative   Patient is a widow and lives alone with her dog.   Patient has three children.   Patient is retired.   Patient has a college education.   Patient is right-handed.   Patient drink one cup of coffee daily.  History reviewed. No pertinent family history.  Past Medical History  Diagnosis Date  . Leukopenia 08/12/2012  . Arthritis   . Chronic kidney disease   . Thyroid disease     History reviewed. No pertinent past surgical history.  Current Outpatient Prescriptions  Medication Sig Dispense Refill  . BIOTIN PO Take 1 tablet by mouth.      . calcium citrate-vitamin D (CITRACAL+D) 315-200 MG-UNIT per tablet Take 1 tablet by mouth 2 (two) times daily.      . Glucosamine-Chondroitin (COSAMIN DS PO) Take 1 tablet by mouth daily.      . magnesium citrate SOLN Take 1 Bottle by mouth once.      . Multiple Vitamins-Minerals (MULTIVITAMIN PO) Take 1 tablet by mouth daily.      Marland Kitchen levothyroxine (LEVOTHROID) 25 MCG tablet Take 1 tablet (25 mcg total) by mouth daily.   90 tablet  3   No current facility-administered medications for this visit.    Allergies as of 03/19/2014 - Review Complete 03/19/2014  Allergen Reaction Noted  . Erythromycin  11/13/2007  . Penicillins  11/13/2007    Vitals: BP 94/54  Pulse 65  Resp 16  Ht 5\' 4"  (1.626 m)  Wt 151 lb (68.493 kg)  BMI 25.91 kg/m2 Last Weight:  Wt Readings from Last 1 Encounters:  03/19/14 151 lb (68.493 kg)   Last Height:   Ht Readings from Last 1 Encounters:  03/19/14 5\' 4"  (1.626 m)   Physical exam:  General: The patient is awake, alert and appears not in acute distress. The patient is well groomed. Head: Normocephalic, atraumatic. Neck is supple. Mallampati 3 , neck circumference:15 , no retrognathia. Nasion open to unrestricted airflow.  She wore retainers.  Cardiovascular:  Regular rate and rhythm, without  murmurs or carotid bruit, and without distended neck veins. Respiratory: Lungs are clear to auscultation. Skin:  Without evidence of edema, or rash Trunk:normal posture.  Neurologic exam : The patient is awake and alert, oriented to place and time.  Memory subjective described as intact.  There is a normal attention span & concentration ability. Speech is fluent without  dysarthria, dysphonia or aphasia.  Mood and affect are appropriate.  Cranial nerves: Pupils are equal and briskly reactive to light. Funduscopic exam without  evidence of pallor or edema.  Extraocular movements  in vertical and horizontal planes intact and without nystagmus. Visual fields by finger perimetry are intact. Hearing to finger rub intact.  Facial sensation intact to fine touch. Facial motor strength is symmetric and tongue and uvula move midline.  Motor exam:  Normal tone and normal muscle bulk and symmetric normal strength in all extremities.  Sensory:  Fine touch, pinprick and vibration were tested in all extremities. Proprioception is  normal.  Coordination: Rapid alternating movements in the  fingers/hands is tested and normal. Finger-to-nose maneuver tested and normal without evidence of ataxia, dysmetria or tremor.  Gait and station: Patient walks without assistive device . Deep tendon reflexes: in the  upper and lower extremities are symmetric and intact. Babinski maneuver response is downgoing.   Assessment:  After physical and neurologic examination, review of laboratory studies, imaging, neurophysiology testing and pre-existing records, assessment is :   There is a neuropathic componoent with RLS symptoms.  Patient has mild sleep interruption from RLS , but feels more sleepy and fatigued. Labs were normal.  I recommend at HST or ONO to screen for apnea as she has few risk factors.  I recoomend using prenatal vitamins  to all neuro regeneration.  We will try Lowest dose Requip . the Patient was advised of sleepiness side effects and driving affected by the medication.    Plan:  Treatment plan and additional workup :

## 2014-03-23 ENCOUNTER — Telehealth: Payer: Self-pay | Admitting: *Deleted

## 2014-03-23 NOTE — Telephone Encounter (Signed)
Message copied by Daryll DrownHEUGLY, Newman Waren B on Tue Mar 23, 2014  5:58 PM ------      Message from: Waldron LabsLILLY, KISSA K      Created: Fri Mar 19, 2014  4:42 PM      Regarding: HST vs ONO       Dr. Vickey Hugerohmeier sent this patient over today for HST setup, however the only order that she has in the system is for an ONO.  The patient would much rather proceed with the HST as opposed to the ONO.  She will be back from out of town on Wednesday if you could please call her for setup.  Coverage is 85% - 15% coinsurance = $50.50 ------

## 2014-03-23 NOTE — Telephone Encounter (Signed)
Scheduled HST for 3:45 on Thursday 03/25/14

## 2014-03-25 ENCOUNTER — Other Ambulatory Visit: Payer: Self-pay | Admitting: *Deleted

## 2014-03-25 ENCOUNTER — Ambulatory Visit (INDEPENDENT_AMBULATORY_CARE_PROVIDER_SITE_OTHER): Payer: Medicare Other | Admitting: *Deleted

## 2014-03-25 DIAGNOSIS — G4713 Recurrent hypersomnia: Secondary | ICD-10-CM

## 2014-03-25 DIAGNOSIS — G4733 Obstructive sleep apnea (adult) (pediatric): Secondary | ICD-10-CM

## 2014-03-25 DIAGNOSIS — G4730 Sleep apnea, unspecified: Secondary | ICD-10-CM

## 2014-03-25 DIAGNOSIS — G4710 Hypersomnia, unspecified: Secondary | ICD-10-CM

## 2014-03-25 NOTE — Sleep Study (Signed)
Patient arrives for HST instruction.  Patient is given written instructions, picture instructions, and a demonstration on how to use HST unit.  All questions / concerns were addressed by technologist.  Financial responsibility was explained.  Follow up information was given to patient regarding how results would be received.  Expected return date is Monday 03/29/14.

## 2014-03-29 NOTE — Sleep Study (Signed)
Called and spoke to patient and reassured her of instructions and that light is okay if it fades or is off as long as it isn't red.  She will do the test again and turn the unit on once and off once in the morning.  She will plan on returning the device for download, scoring, and interpretation on 03/30/14. -sh

## 2014-03-29 NOTE — Sleep Study (Signed)
Pt used the hst device for 2 days but she does not feel that it was functioning properly.  Says that the light was coming on and going off several times.  She pressed the button continuously.  She reports even changing the battery on the hst device and still receiving the same result.  Advised her that you were working tonight but I would see if you could call her for when you come in.  Told her to go ahead and keep it for an additional night, put fresh batteries in and try to use it again.  Please call pt tonight if possible.  Personal assistantKissa Lilly Sleep Lab Office Coordinator

## 2014-04-15 NOTE — Sleep Study (Signed)
Out of Center Sleep Test (OCST) Report Patient: Linda DeterCaroline Doughman Procedure Number: [id number]  DOB:  11/17/1941 Date:  03/25/14  MR# [id number] Referring MD:  [name], MD  PAST MEDICAL HISTORY:  Dysesthesia of multiple sites, Restless legs syndrome, Anemia associated with nutritional deficiency, Hypersomnia with sleep apnea, Leukopenia, Arthritis, Chronic kidney disease, thyroid disease. SLEEP SYMPTOMS: 2 year history of trouble sleeping at night, tingling does have some impact on her sleep, witnessed snoring, sleepiness PHYSICAL EXAMINATION:  Height 5'4", Weight 151, BMI 25.91 kg/m2 MEDICATIONS:  Biotin PO, Citracal +D, Glucosamine-Chondroitin, Magnesium citrate solution, Multivitamin, Levothyroxine  TECHNICAL SPECIFICATIONS AND SCORING CRITERIA:  This is a 5 channel digital Apnea Link-Plus OCST performed in the patient's home.  Data acquisition and analysis were performed using the ResMed Apnea Link software system.  Snore and respiratory flow (nasal pressure) signals were sampled at 100 Hz.  Respiratory effort 10 Hz, and Nonin TM pulse & Oximetry at 1 Hz.  SpO2 averaging was less than or equal to 3 seconds @ 80 beats per minute.  This is a standard 5 channel montage with one channel for nasal pressure flow transducer, one for thoracic respiratory effort, one for snore, one for pulse and one for SpO2.In accordance with AASM standards, apneas were scored during any drops in nasal pressure transducer excursion by greater than or equal to 90% of baseline lasting 10 seconds or longer.  Hypopneas were scored during any drops in nasal pressure transducer excursion by greater than or equal to 30% of baseline lasting 10 seconds or longer in association with a minimum SpO2 desaturation of 4% from pre-event baseline. RECORDING OUTCOMES:  See attached Report (patient recorded for two consecutive nights) TECHNICAL ACCURACY OF TEST:  adequate for interpretation -Data manually scored by [facility] Delayna Sparlin, RPSGT.    IMPRESSION:  Very mild sleep apnea with respiratory disturbance index showing an Apnea Hypopnea index (AHI= how many times per hour one stops breathing or has significant episodes of shallow breathing) of 8/hr (<5/hr is normal), and RDI of 12/hr would not need to use CPAP but may benefit from a dental device which can address snoring at the same time. DIAGNOSIS:  327.23 Obstructive sleep apnea (mild) RECOMMENDATIONS:  Recommend referral to a dental specialist to be fit with an oral appliance device. I certify that I have reviewed the entire raw data recording prior to the issuance of this report in accordance with the Standards of Accreditation of the American Academy of Sleep Medicine (AASM)   _______________________________________________________                           Date signed: 04/15/14 Melvyn Novasarmen Dohmeier, MD Diplomat, American Board of Neurology  Diplomat, American Board of Sleep Medicine Member, American Academy of Sleep Medicine    See media tab for full signed report

## 2014-04-16 ENCOUNTER — Telehealth: Payer: Self-pay | Admitting: Neurology

## 2014-04-16 NOTE — Telephone Encounter (Signed)
HST were read, please have Linda Macdonald read the result.

## 2014-04-16 NOTE — Telephone Encounter (Signed)
I called and spoke with the patient about her HST results. I informed the patient that the study revealed very mild sleep apnea and that Dr. Vickey Hugerohmeier recommend an oral appliance device. Patient stated she is still having the generalized tingling that is waking her up at night that needs to be addressed. I informed the patient that I will pass this along to Dr. Vickey Hugerohmeier and the patient wants to know if her dentist can prescribe this device. I will fax a copy of this report to Dr. Netta Corriganoolittle's office and mail a copy to the patient.

## 2014-04-16 NOTE — Telephone Encounter (Signed)
Pt calling because she had not received results from her home sleep test.  She also reports an ongoing problem of tingling in her feet, legs, moving upwards to the arms and hands.  In reviewing Dr. Oliva Bustardohmeier's office note, she reports a neuropathic component with RLS symptoms.  She recommends Requip at its lowest dose.  Pt says that she has no knowledge of a prescription for Requip.  I checked the patient's medication list and did not see it ordered.  Advised the patient that I would follow with Dr. Vickey Hugerohmeier asap.

## 2014-04-19 ENCOUNTER — Encounter: Payer: Self-pay | Admitting: *Deleted

## 2014-04-19 NOTE — Telephone Encounter (Signed)
Will need Rv for the tingling with PCP- if PCP does prefer a neuro work up , schedule with  megan or me,

## 2014-04-21 NOTE — Telephone Encounter (Signed)
Patient contacted with results by Janeece AgeeSharon Gannaway on 04/16/14, see additional telephone encounter in patient chart. -sh

## 2014-04-22 ENCOUNTER — Telehealth: Payer: Self-pay | Admitting: Neurology

## 2014-04-22 ENCOUNTER — Telehealth: Payer: Self-pay | Admitting: Internal Medicine

## 2014-04-22 NOTE — Telephone Encounter (Signed)
Error. Closing encounter 

## 2014-04-22 NOTE — Telephone Encounter (Signed)
Patient has been notified and scheduled for 05-05-14.

## 2014-04-22 NOTE — Telephone Encounter (Signed)
Patient notified and scheduled for 05-05-14.

## 2014-04-22 NOTE — Telephone Encounter (Signed)
Linda Macdonald, I spoke with [name] to schedule appt with Dr Vickey Hugerohmeier.  Dr. Vickey Hugerohmeier first available appt isn't until November.  Patient is requesting to be seen prior to November due to tingling in legs has worsened.  Please call and advise.  Thanks

## 2014-05-05 ENCOUNTER — Ambulatory Visit (INDEPENDENT_AMBULATORY_CARE_PROVIDER_SITE_OTHER): Payer: Medicare Other | Admitting: Neurology

## 2014-05-05 ENCOUNTER — Encounter: Payer: Self-pay | Admitting: Neurology

## 2014-05-05 ENCOUNTER — Encounter (INDEPENDENT_AMBULATORY_CARE_PROVIDER_SITE_OTHER): Payer: Self-pay

## 2014-05-05 VITALS — BP 99/60 | HR 67 | Ht 63.75 in | Wt 150.0 lb

## 2014-05-05 DIAGNOSIS — G4710 Hypersomnia, unspecified: Secondary | ICD-10-CM

## 2014-05-05 DIAGNOSIS — R209 Unspecified disturbances of skin sensation: Secondary | ICD-10-CM

## 2014-05-05 DIAGNOSIS — G4733 Obstructive sleep apnea (adult) (pediatric): Secondary | ICD-10-CM

## 2014-05-05 DIAGNOSIS — R208 Other disturbances of skin sensation: Secondary | ICD-10-CM

## 2014-05-05 MED ORDER — ARMODAFINIL 150 MG PO TABS: 150.0000 mg | ORAL_TABLET | Freq: Every day | ORAL | Status: DC

## 2014-05-05 MED ORDER — ROPINIROLE HCL 0.25 MG PO TABS: 0.2500 mg | ORAL_TABLET | Freq: Every day | ORAL | Status: DC

## 2014-05-05 MED ORDER — ROPINIROLE HCL 0.25 MG PO TABS: 0.2500 mg | ORAL_TABLET | Freq: Three times a day (TID) | ORAL | Status: DC

## 2014-05-05 NOTE — Patient Instructions (Signed)
Hypersomnia Hypersomnia usually brings recurrent episodes of excessive daytime sleepiness or prolonged nighttime sleep. It is different than feeling tired due to lack of or interrupted sleep at night. People with hypersomnia are compelled to nap repeatedly during the day. This is often at inappropriate times such as:  At work.  During a meal.  In conversation. These daytime naps usually provide no relief. This disorder typically affects adolescents and young adults. CAUSES  This condition may be caused by:  Another sleep disorder (such as narcolepsy or sleep apnea).  Dysfunction of the autonomic nervous system.  Drug or alcohol abuse.  A physical problem, such as:  A tumor.  Head trauma. This is damage caused by an accident.  Injury to the central nervous system.  Certain medications, or medicine withdrawal.  Medical conditions may contribute to the disorder, including:  Multiple sclerosis.  Depression.  Encephalitis.  Epilepsy.  Obesity.  Some people appear to have a genetic predisposition to this disorder. In others, there is no known cause. SYMPTOMS   Patients often have difficulty waking from a long sleep. They may feel dazed or confused.  Other symptoms may include:  Anxiety.  Increased irritation (inflammation).  Decreased energy.  Restlessness.  Slow thinking.  Slow speech.  Loss of appetite.  Hallucinations.  Memory difficulty.  Tremors, Tics.  Some patients lose the ability to function in family, social, occupational, or other settings. TREATMENT  Treatment is symptomatic in nature. Stimulants and other drugs may be used to treat this disorder. Changes in behavior may help. For example, avoid night work and social activities that delay bed time. Changes in diet may offer some relief. Patients should avoid alcohol and caffeine. PROGNOSIS  The likely outcome (prognosis) for persons with hypersomnia depends on the cause of the disorder.  The disorder itself is not life threatening. But it can have serious consequences. For example, automobile accidents can be caused by falling asleep while driving. The attacks usually continue indefinitely. Document Released: 11/16/2002 Document Revised: 02/18/2012 Document Reviewed: 10/20/2008 ExitCare Patient Information 2014 [employer].  

## 2014-05-05 NOTE — Progress Notes (Signed)
[facility]   Provider:  Melvyn Novas, [place]  Referring Provider: Tonye Pearson, MD Primary Care Physician:  Tonye Pearson, MD  Chief Complaint  Patient presents with  . Follow-up    Room 11  . Increased tingling    HPI:  Linda Macdonald is a 73 y.o. female widow , and is seen here as a referral from Dr. Merla Riches for tingling dysesthesias.   Last visit note . CD Referred to [place] Caucasian, right-handed female for the evaluation of a constant sensation of tingling. This dysesthesia is started bilaterally  in her feet and is slowly ascending she is unsure at what time it initially set on. Onset well before 2  years ago , that she first noticed the onset -and for a long time she was fine as  it didn't interfere with her sleep-  it didn't affect her quality of life.  Then 2 years ago she began having trouble asleep at night , usually she would wake up, called the bathroom and right back to sleep, she  noted this  became more difficult due to the tingling , which was interfering with sleep.  At the same time she noticed some trouble with balance and fine motor skills especially during a dance class ,  she developed some clumsiness, some off balance feeling.  She stated  Her nails became brittle and ridged , she alsochanged her body weight at the time by 25 pound. At the time of her assessment by Dr. Merla Riches she  was 150 pounds .the patient continues to be physically active has no cognitive complications. Thyroid tests and vitamin D deficiencies have been ruled out Linda Macdonald reported that her father died in 40 and after he had  pernicious anemia-vitamin B12 deficiency.  She sleeps by 10-10.30 PM , promptly after going to be and wakes regularly at 3.30 AM for a medication and bathroom break ,  the tingling is very disturbing. About 6.30 AM is her regular rise time. She doesn't no believe she snores, but has fallen asleep in a lecture (  than she was witnessed to snore) , she gets sleepy and blurred and double vision when driving long distance and gets fatigued.  She wakes spontaneously. Has one cup of coffee in AM , non smoker , rare ETOH.   [name] returned  today after a home sleep test from April 2015 revealed a very mild apnea ,  AHI off 8, RDI of 12 .  The patient will proceed with a dental device to treat mild OSA and UARS.    Review of Systems: Out of a complete 14 system review, the patient complains of only the following symptoms, and all other reviewed systems are negative. Creepy,  crawly and tingling sensation in feet and hands now, too, relief when massaging, rubbing or waking.  Epworth 15 , FSS 28 , GDS 1 point. RLS ??   Sister has insomnia .  History   Social History  . Marital Status: Widowed    Spouse Name: N/A    Number of Children: 3  . Years of Education: College   Occupational History  . Not on file.   Social History Main Topics  . Smoking status: Never Smoker   . Smokeless tobacco: Never Used  . Alcohol Use: Yes     Comment: socially  . Drug Use: No  . Sexual Activity: Not on file   Other Topics Concern  . Not on  file   Social History Narrative   Patient is a widow and lives alone with her dog.   Patient has three children.   Patient is retired.   Patient has a college education.   Patient is right-handed.   Patient is not drinking any caffeine.             History reviewed. No pertinent family history.  Past Medical History  Diagnosis Date  . Leukopenia 08/12/2012  . Arthritis   . Chronic kidney disease   . Thyroid disease     History reviewed. No pertinent past surgical history.  Current Outpatient Prescriptions  Medication Sig Dispense Refill  . BIOTIN PO Take 1 tablet by mouth. 5000 mg      . calcium citrate-vitamin D (CITRACAL+D) 315-200 MG-UNIT per tablet Take 1 tablet by mouth 2 (two) times daily.      . Cholecalciferol (VITAMIN D-3) 1000 UNITS CAPS  Take 1 capsule by mouth daily.      Marland Kitchen. ESTRACE VAGINAL 0.1 MG/GM vaginal cream Twice a week      . Glucosamine-Chondroitin (COSAMIN DS PO) Take 1 tablet by mouth daily.      . magnesium citrate SOLN Take 1 Bottle by mouth once. 150 mg      . Multiple Vitamins-Minerals (MULTIVITAMIN PO) Take 1 tablet by mouth daily.      Marland Kitchen. levothyroxine (LEVOTHROID) 25 MCG tablet Take 1 tablet (25 mcg total) by mouth daily.  90 tablet  3   No current facility-administered medications for this visit.    Allergies as of 05/05/2014 - Review Complete 05/05/2014  Allergen Reaction Noted  . Erythromycin  11/13/2007  . Penicillins  11/13/2007    Vitals: BP 99/60  Pulse 67  Ht 5' 3.75" (1.619 m)  Wt 150 lb (68.04 kg)  BMI 25.96 kg/m2 Last Weight:  Wt Readings from Last 1 Encounters:  05/05/14 150 lb (68.04 kg)   Last Height:   Ht Readings from Last 1 Encounters:  05/05/14 5' 3.75" (1.619 m)   Physical exam:  General: The patient is awake, alert and appears not in acute distress. The patient is well groomed. Head: Normocephalic, atraumatic. Neck is supple. Mallampati 3 , neck circumference:15 , no retrognathia. Nasion open to unrestricted airflow.  She wore retainers.  Cardiovascular:  Regular rate and rhythm, without  murmurs or carotid bruit, and without distended neck veins. Respiratory: Lungs are clear to auscultation. Skin:  Without evidence of edema, or rash Trunk:normal posture.  Neurologic exam : The patient is awake and alert, oriented to place and time.  Memory subjective described as intact.  There is a normal attention span & concentration ability. Speech is fluent without  dysarthria, dysphonia or aphasia.  Mood and affect are appropriate.  Cranial nerves: Pupils are equal and briskly reactive to light. Funduscopic exam without  evidence of pallor or edema.  Extraocular movements  in vertical and horizontal planes intact and without nystagmus. Visual fields by finger perimetry are  intact. Hearing to finger rub intact.  Facial sensation intact to fine touch. Facial motor strength is symmetric and tongue and uvula move midline.  Motor exam:  Normal tone and normal muscle bulk and symmetric normal strength in all extremities.  Sensory:  Fine touch, pinprick and vibration were normal. She still feels abnormal tingling at various places of the body, face and bilaterally. .  Coordination: Rapid alternating movements in the fingers/hands is tested and normal. Finger-to-nose maneuver tested and normal without evidence of ataxia, dysmetria or  tremor.  Gait and station: Patient walks without assistive device . Deep tendon reflexes: in the  upper and lower extremities are symmetric and intact. Babinski maneuver response is downgoing.   Assessment:  After physical and neurologic examination, review of laboratory studies, imaging, neurophysiology testing and pre-existing records, assessment is :   There is a possible neuropathic component with RLS symptoms.  Patient has mild sleep interruption from RLS , but feels more sleepy and fatigued. Labs were normal.  I reviewed the  HST-  Using prenatal vitamins , she has not noted improvement.  Dental device was discussed by phone after HST results were back and the patient already addressed this with her dentist, she wants to use the device to address both- the bruxism and snoring/ mild apnea.     Appliance for  mld OSA / UARS is recommended.  We will try lowest dose Requip . the Patient was advised of sleepiness side effects and driving affected by the medication.    Plan:  Treatment plan and additional workup : referral to dentist to archive medical necessity status for dental treatment. nuvigil for sleepiness. Requip for RLS.

## 2014-10-19 ENCOUNTER — Ambulatory Visit (INDEPENDENT_AMBULATORY_CARE_PROVIDER_SITE_OTHER): Payer: Medicare Other | Admitting: Internal Medicine

## 2014-10-19 VITALS — BP 120/72 | HR 75 | Temp 97.8°F | Resp 16 | Ht 64.0 in | Wt 152.2 lb

## 2014-10-19 DIAGNOSIS — R202 Paresthesia of skin: Secondary | ICD-10-CM

## 2014-10-19 LAB — COMPREHENSIVE METABOLIC PANEL
ALBUMIN: 4 g/dL (ref 3.5–5.2)
ALK PHOS: 51 U/L (ref 39–117)
ALT: 19 U/L (ref 0–35)
AST: 24 U/L (ref 0–37)
BUN: 15 mg/dL (ref 6–23)
CO2: 27 mEq/L (ref 19–32)
Calcium: 9.5 mg/dL (ref 8.4–10.5)
Chloride: 104 mEq/L (ref 96–112)
Creat: 0.74 mg/dL (ref 0.50–1.10)
Glucose, Bld: 92 mg/dL (ref 70–99)
POTASSIUM: 5.1 meq/L (ref 3.5–5.3)
Sodium: 137 mEq/L (ref 135–145)
Total Bilirubin: 0.4 mg/dL (ref 0.2–1.2)
Total Protein: 6.6 g/dL (ref 6.0–8.3)

## 2014-10-19 LAB — POCT CBC
GRANULOCYTE PERCENT: 57 % (ref 37–80)
HCT, POC: 43.3 % (ref 37.7–47.9)
Hemoglobin: 14.2 g/dL (ref 12.2–16.2)
Lymph, poc: 1.4 (ref 0.6–3.4)
MCH, POC: 32.6 pg — AB (ref 27–31.2)
MCHC: 32.9 g/dL (ref 31.8–35.4)
MCV: 98.9 fL — AB (ref 80–97)
MID (CBC): 0.3 (ref 0–0.9)
MPV: 6.7 fL (ref 0–99.8)
PLATELET COUNT, POC: 219 10*3/uL (ref 142–424)
POC Granulocyte: 2.2 (ref 2–6.9)
POC LYMPH %: 35.9 % (ref 10–50)
POC MID %: 7.1 % (ref 0–12)
RBC: 4.37 M/uL (ref 4.04–5.48)
RDW, POC: 13 %
WBC: 3.8 10*3/uL — AB (ref 4.6–10.2)

## 2014-10-19 LAB — TSH: TSH: 1.675 u[IU]/mL (ref 0.350–4.500)

## 2014-10-19 LAB — VITAMIN B12: VITAMIN B 12: 569 pg/mL (ref 211–911)

## 2014-10-19 LAB — POCT SEDIMENTATION RATE: POCT SED RATE: 30 mm/hr — AB (ref 0–22)

## 2014-10-19 NOTE — Progress Notes (Signed)
Subjective:  This chart was scribed for Linda Siaobert Doolittle, MD by Carl Bestelina Holson, Medical Scribe. This patient was seen in Room 4 and the patient's care was started at 9:54 AM.   Patient ID: Linda Macdonald, female    DOB: 06/14/1941, 73 y.o.   MRN: [id number]  HPI HPI Comments: Linda Macdonald is a 73 y.o. female who presents to the [facility] for constant, gradually worsening tingling in her legs, hands, and feet bilaterally.  She was given medications for her symptoms by Dr. Vickey Hugerohmeier but she was unable to come up with a definitive diagnosis.  She states that her symptoms will wake her up at night.  Her symptoms do not compromise her workouts and none of her exercises exacerbate her current symptoms.  She states that she has begun to wake up with a headache located at her temples bilaterally.  She states that her headache will fade throughout the day.  The tingling in her hands does not inhibit her ability to feel things.  She has tried discontinuing her medications as well as reducing her caffeine intake with no relief to her symptoms.  She does not endorse visual changes, intermittent blurred vision, and partial loss of her vision as associated symptoms but states that she is in the process of getting fitted for new contacts because she is unable to focus.  She does not drive without glasses.  She does not have any chronic health problems.      Past Medical History  Diagnosis Date   Leukopenia 08/12/2012   Arthritis    Chronic kidney disease    Thyroid disease    History reviewed. No pertinent past surgical history. History reviewed. No pertinent family history. History   Social History   Marital Status: Widowed    Spouse Name: N/A    Number of Children: 3   Years of Education: College   Occupational History   Not on file.   Social History Main Topics   Smoking status: Never Smoker    Smokeless tobacco: Never Used   Alcohol Use: Yes     Comment:  socially   Drug Use: No   Sexual Activity: Not on file   Other Topics Concern   Not on file   Social History Narrative   Patient is a widow and lives alone with her dog.   Patient has three children.   Patient is retired.   Patient has a college education.   Patient is right-handed.   Patient is not drinking any caffeine.            Allergies  Allergen Reactions   Erythromycin    Penicillins     Review of Systems  Eyes: Negative for visual disturbance.  Neurological: Positive for headaches.  Psychiatric/Behavioral: Positive for sleep disturbance.     Objective:  Physical Exam  Constitutional: She is oriented to person, place, and time. She appears well-developed and well-nourished.  HENT:  Head: Normocephalic and atraumatic.  Eyes: Conjunctivae and EOM are normal. Pupils are equal, round, and reactive to light.  Neck: Normal range of motion.  Cardiovascular: Normal rate and intact distal pulses.   Pulmonary/Chest: Effort normal.  Musculoskeletal: Normal range of motion. She exhibits no edema.  There is no muscle atrophy.  There is no ankle clonus.   Neurological: She is alert and oriented to person, place, and time. No cranial nerve deficit.  Reflex Scores:      Patellar reflexes are 1+ on the  right side and 2+ on the left side. Sensation in the extremities is intact.  Motor is intact as well.  Patellar reflexes are only 1+ but symmetrical.    Skin: Skin is warm and dry.  Psychiatric: She has a normal mood and affect. Her behavior is normal.  Nursing note and vitals reviewed.    BP 120/72 mmHg   Pulse 75   Temp(Src) 97.8 F (36.6 C) (Oral)   Resp 16   Ht 5\' 4"  (1.626 m)   Wt 152 lb 3.2 oz (69.037 kg)   BMI 26.11 kg/m2   SpO2 96% Assessment & Plan:  Paresthesia of upper and lower extremities of both sides - Plan: POCT CBC, Comprehensive metabolic panel, TSH, POCT SEDIMENTATION RATE, Vitamin B12  If all wnl consider pncv and mri brain and c spine to r/o  demyelination  I personally performed the services described in this documentation, which was scribed in my presence. The recorded information has been reviewed and is accurate.

## 2014-11-22 ENCOUNTER — Telehealth: Payer: Self-pay

## 2014-11-22 NOTE — Telephone Encounter (Signed)
LMVM reminding patient to get her flu shot. 

## 2014-12-24 ENCOUNTER — Ambulatory Visit: Payer: Self-pay

## 2015-05-18 ENCOUNTER — Encounter: Payer: Self-pay | Admitting: *Deleted

## 2015-11-07 ENCOUNTER — Encounter: Payer: Self-pay | Admitting: Internal Medicine

## 2016-02-27 ENCOUNTER — Other Ambulatory Visit: Payer: Self-pay | Admitting: Family Medicine

## 2016-02-27 DIAGNOSIS — R1011 Right upper quadrant pain: Secondary | ICD-10-CM

## 2016-03-07 ENCOUNTER — Other Ambulatory Visit: Payer: Self-pay

## 2016-03-20 ENCOUNTER — Ambulatory Visit
Admission: RE | Admit: 2016-03-20 | Discharge: 2016-03-20 | Disposition: A | Discharge status: Home / Self Care | Payer: Medicare Other | Source: Ambulatory Visit | Attending: Family Medicine | Admitting: Family Medicine

## 2016-03-20 DIAGNOSIS — R1011 Right upper quadrant pain: Secondary | ICD-10-CM

## 2016-06-04 DIAGNOSIS — R05 Cough: Secondary | ICD-10-CM | POA: Diagnosis not present

## 2016-06-04 DIAGNOSIS — R5381 Other malaise: Secondary | ICD-10-CM | POA: Diagnosis not present

## 2016-06-28 DIAGNOSIS — R1310 Dysphagia, unspecified: Secondary | ICD-10-CM | POA: Diagnosis not present

## 2016-06-28 DIAGNOSIS — R498 Other voice and resonance disorders: Secondary | ICD-10-CM | POA: Diagnosis not present

## 2016-06-28 DIAGNOSIS — R05 Cough: Secondary | ICD-10-CM | POA: Diagnosis not present

## 2016-06-28 DIAGNOSIS — K219 Gastro-esophageal reflux disease without esophagitis: Secondary | ICD-10-CM | POA: Diagnosis not present

## 2016-06-30 ENCOUNTER — Ambulatory Visit (INDEPENDENT_AMBULATORY_CARE_PROVIDER_SITE_OTHER): Payer: Medicare Other | Admitting: Family Medicine

## 2016-06-30 VITALS — BP 122/50 | HR 81 | Temp 97.9°F | Resp 16 | Ht 63.5 in | Wt 143.0 lb

## 2016-06-30 DIAGNOSIS — L03115 Cellulitis of right lower limb: Secondary | ICD-10-CM

## 2016-06-30 MED ORDER — DOXYCYCLINE HYCLATE 100 MG PO TABS: 100.0000 mg | ORAL_TABLET | Freq: Two times a day (BID) | ORAL | Status: DC

## 2016-06-30 MED ORDER — MUPIROCIN 2 % EX OINT
1.0000 | TOPICAL_OINTMENT | Freq: Two times a day (BID) | CUTANEOUS | Status: DC
Start: 2016-06-30 — End: 2017-04-25

## 2016-06-30 MED ORDER — MUPIROCIN 2 % EX OINT: 1.0000 "application " | TOPICAL_OINTMENT | Freq: Two times a day (BID) | CUTANEOUS | Status: DC

## 2016-06-30 NOTE — Progress Notes (Signed)
By signing my name below, I, [name], attest that this documentation has been prepared under the direction and in the presence of Meredith Staggers, MD.  Electronically Signed: Arvilla Market, Medical Scribe. 06/30/2016. 8:17 AM.  Subjective:    Patient ID: Linda Macdonald, female    DOB: 08-Aug-1941, 75 y.o.   MRN: [id number]  HPI Chief Complaint  Patient presents with  . Insect Bite    Right leg, rash x 1 week    HPI Comments: Linda Macdonald is a 75 y.o. female who presents to the [facility] complaining of insect bite on her right leg onset 10 days. Pt doesn't remember getting bit. Pt states the redness from the bite has been spreading, possible initial pustule. Pt has been cleaning it with soap and warm water QD, and 3-in-1 OTC abx QD at first, now BID. Pt denies fevers, chills, or feeling ill.  Patient Active Problem List   Diagnosis Date Noted  . Dysesthesia of multiple sites 03/19/2014  . Restless legs syndrome (RLS) 03/19/2014  . Anemia associated with nutritional deficiency 03/19/2014  . Hypersomnia with sleep apnea, unspecified 03/19/2014  . Acute medial meniscus tear of left knee 10/16/2012  . Leukopenia 08/12/2012  . CALCULUS OF KIDNEY 10/06/2010  . WEIGHT LOSS 08/10/2010  . DIARRHEA 08/10/2010  . ABDOMINAL PAIN RIGHT LOWER QUADRANT 08/10/2010  . ERUCTATION 04/18/2010  . HYPERSOMNIA 01/24/2010  . 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 VENOUS INSUFFICIENCY, LEGS 08/29/2009  . HYPOTHYROIDISM 04/21/2008  . DEPRESSION 04/21/2008  . SLEEP APNEA, OBSTRUCTIVE 04/21/2008  . DEEP VENOUS THROMBOPHLEBITIS, LEFT, LEG, HX OF 04/21/2008  . WISDOM TEETH EXTRACTION, HX OF 04/21/2008   Past Medical History  Diagnosis Date  . Leukopenia 08/12/2012  . Arthritis   . Chronic kidney disease   . Thyroid disease    History reviewed. No pertinent past surgical history. Allergies  Allergen Reactions  . Erythromycin   . Penicillins    Prior to  Admission medications   Medication Sig Start Date End Date Taking? Authorizing Provider  BIOTIN PO Take 1 tablet by mouth. 5000 mg   Yes Historical Provider, MD  calcium citrate-vitamin D (CITRACAL+D) 315-200 MG-UNIT per tablet Take 1 tablet by mouth 2 (two) times daily.   Yes Historical Provider, MD  Cholecalciferol (VITAMIN D-3) 1000 UNITS CAPS Take 1 capsule by mouth daily.   Yes Historical Provider, MD  ESTRACE VAGINAL 0.1 MG/GM vaginal cream Twice a week 04/20/14  Yes Historical Provider, MD  Glucosamine-Chondroitin (COSAMIN DS PO) Take 1 tablet by mouth daily.   Yes Historical Provider, MD  magnesium citrate SOLN Take 1 Bottle by mouth once. 150 mg   Yes Historical Provider, MD  Multiple Vitamins-Minerals (MULTIVITAMIN PO) Take 1 tablet by mouth daily.   Yes Historical Provider, MD  Omeprazole (PRILOSEC PO) Take by mouth.   Yes Historical Provider, MD  levothyroxine (LEVOTHROID) 25 MCG tablet Take 1 tablet (25 mcg total) by mouth daily. 04/02/11 03/19/14  Jacques Navy, MD   Social History   Social History  . Marital Status: Widowed    Spouse Name: N/A  . Number of Children: 3  . Years of Education: College   Occupational History  . Not on file.   Social History Main Topics  . Smoking status: Never Smoker   . Smokeless tobacco: Never Used  . Alcohol Use: Yes     Comment: socially  . Drug Use: No  . Sexual Activity: Not on file   Other  Topics Concern  . Not on file   Social History Narrative   Patient is a widow and lives alone with her dog.   Patient has three children.   Patient is retired.   Patient has a college education.   Patient is right-handed.   Patient is not drinking any caffeine.            Review of Systems  Constitutional: Negative for fever and chills.  Skin: Positive for color change. Negative for rash and wound.   Objective:  BP 122/50 mmHg  Pulse 81  Temp(Src) 97.9 F (36.6 C) (Oral)  Resp 16  Ht 5' 3.5" (1.613 m)  Wt 143 lb (64.864 kg)   BMI 24.93 kg/m2  SpO2 97%  Physical Exam  Constitutional: She appears well-developed and well-nourished. No distress.  HENT:  Head: Normocephalic and atraumatic.  Eyes: Conjunctivae are normal.  Neck: Neck supple.  Cardiovascular: Normal rate.   Pulmonary/Chest: Effort normal.  Neurological: She is alert.  Skin: Skin is warm and dry. There is erythema.  Erythematous patch on her right anterior lower leg right below the knee- Erythema measures 5x4 cm Minimum induration No central fluctuance No surrounding lymphadenopathy  Psychiatric: She has a normal mood and affect. Her behavior is normal.  Nursing note and vitals reviewed.   Assessment & Plan:   CORDA LEIBER is a 75 y.o. female Cellulitis of right lower extremity - Plan: doxycycline (VIBRA-TABS) 100 MG tablet, mupirocin ointment (BACTROBAN) 2 %, DISCONTINUED: mupirocin ointment (BACTROBAN) 2 %  Early cellulitis, can try Bactroban ointment initially for 24-36 hours, then if not improving, start doxycycline. Other symptomatic care discussed, RTC precautions. Side effects and risks of antibiotics were discussed.  Meds ordered this encounter  Medications  . Omeprazole (PRILOSEC PO)    Sig: Take by mouth.  . DISCONTD: mupirocin ointment (BACTROBAN) 2 %    Sig: Place 1 application into the nose 2 (two) times daily.    Dispense:  22 g    Refill:  0  . doxycycline (VIBRA-TABS) 100 MG tablet    Sig: Take 1 tablet (100 mg total) by mouth 2 (two) times daily.    Dispense:  20 tablet    Refill:  0  . mupirocin ointment (BACTROBAN) 2 %    Sig: Apply 1 application topically 2 (two) times daily.    Dispense:  22 g    Refill:  0   Patient Instructions    You can try changing to the Bactroban ointment today - twice per day. Cleanse with soap and water, warm compresses as discussed. If redness is continuing to increase in size in the next day or 2, start the oral antibiotic. If continued spread of redness on the antibiotic, or any  fevers or worsening symptoms, return for recheck. Let me know if you have questions in the meantime.  Cellulitis Cellulitis is an infection of the skin and the tissue beneath it. The infected area is usually red and tender. Cellulitis occurs most often in the arms and lower legs.  CAUSES  Cellulitis is caused by bacteria that enter the skin through cracks or cuts in the skin. The most common types of bacteria that cause cellulitis are staphylococci and streptococci. SIGNS AND SYMPTOMS   Redness and warmth.  Swelling.  Tenderness or pain.  Fever. DIAGNOSIS  Your health care provider can usually determine what is wrong based on a physical exam. Blood tests may also be done. TREATMENT  Treatment usually involves taking an antibiotic  medicine. HOME CARE INSTRUCTIONS   Take your antibiotic medicine as directed by your health care provider. Finish the antibiotic even if you start to feel better.  Keep the infected arm or leg elevated to reduce swelling.  Apply a warm cloth to the affected area up to 4 times per day to relieve pain.  Take medicines only as directed by your health care provider.  Keep all follow-up visits as directed by your health care provider. SEEK MEDICAL CARE IF:   You notice red streaks coming from the infected area.  Your red area gets larger or turns dark in color.  Your bone or joint underneath the infected area becomes painful after the skin has healed.  Your infection returns in the same area or another area.  You notice a swollen bump in the infected area.  You develop new symptoms.  You have a fever. SEEK IMMEDIATE MEDICAL CARE IF:   You feel very sleepy.  You develop vomiting or diarrhea.  You have a general ill feeling (malaise) with muscle aches and pains.   This information is not intended to replace advice given to you by your health care provider. Make sure you discuss any questions you have with your health care provider.   Document  Released: 09/05/2005 Document Revised: 08/17/2015 Document Reviewed: 02/11/2012 Elsevier Interactive Patient Education 2016 [employer].    IF you received an x-ray today, you will receive an invoice from [facility]. Please contact [facility] at [phone] with questions or concerns regarding your invoice.   IF you received labwork today, you will receive an invoice from [employer]. Please contact [facility] at [phone] with questions or concerns regarding your invoice.   Our billing staff will not be able to assist you with questions regarding bills from these companies.  You will be contacted with the lab results as soon as they are available. The fastest way to get your results is to activate your My Chart account. Instructions are located on the last page of this paperwork. If you have not heard from [place] regarding the results in 2 weeks, please contact this office.        I personally performed the services described in this documentation, which was scribed in my presence. The recorded information has been reviewed and considered, and addended by me as needed.   Signed,   Meredith Staggers, MD [facility].  06/30/2016 8:34 AM

## 2016-06-30 NOTE — Patient Instructions (Addendum)
  You can try changing to the Bactroban ointment today - twice per day. Cleanse with soap and water, warm compresses as discussed. If redness is continuing to increase in size in the next day or 2, start the oral antibiotic. If continued spread of redness on the antibiotic, or any fevers or worsening symptoms, return for recheck. Let me know if you have questions in the meantime.  Cellulitis Cellulitis is an infection of the skin and the tissue beneath it. The infected area is usually red and tender. Cellulitis occurs most often in the arms and lower legs.  CAUSES  Cellulitis is caused by bacteria that enter the skin through cracks or cuts in the skin. The most common types of bacteria that cause cellulitis are staphylococci and streptococci. SIGNS AND SYMPTOMS   Redness and warmth.  Swelling.  Tenderness or pain.  Fever. DIAGNOSIS  Your health care provider can usually determine what is wrong based on a physical exam. Blood tests may also be done. TREATMENT  Treatment usually involves taking an antibiotic medicine. HOME CARE INSTRUCTIONS   Take your antibiotic medicine as directed by your health care provider. Finish the antibiotic even if you start to feel better.  Keep the infected arm or leg elevated to reduce swelling.  Apply a warm cloth to the affected area up to 4 times per day to relieve pain.  Take medicines only as directed by your health care provider.  Keep all follow-up visits as directed by your health care provider. SEEK MEDICAL CARE IF:   You notice red streaks coming from the infected area.  Your red area gets larger or turns dark in color.  Your bone or joint underneath the infected area becomes painful after the skin has healed.  Your infection returns in the same area or another area.  You notice a swollen bump in the infected area.  You develop new symptoms.  You have a fever. SEEK IMMEDIATE MEDICAL CARE IF:   You feel very sleepy.  You develop  vomiting or diarrhea.  You have a general ill feeling (malaise) with muscle aches and pains.   This information is not intended to replace advice given to you by your health care provider. Make sure you discuss any questions you have with your health care provider.   Document Released: 09/05/2005 Document Revised: 08/17/2015 Document Reviewed: 02/11/2012 Elsevier Interactive Patient Education 2016 [employer].    IF you received an x-ray today, you will receive an invoice from [facility]. Please contact [facility] at [phone] with questions or concerns regarding your invoice.   IF you received labwork today, you will receive an invoice from [employer]. Please contact [facility] at [phone] with questions or concerns regarding your invoice.   Our billing staff will not be able to assist you with questions regarding bills from these companies.  You will be contacted with the lab results as soon as they are available. The fastest way to get your results is to activate your My Chart account. Instructions are located on the last page of this paperwork. If you have not heard from [place] regarding the results in 2 weeks, please contact this office.

## 2016-07-02 ENCOUNTER — Telehealth: Payer: Self-pay

## 2016-07-02 ENCOUNTER — Ambulatory Visit (INDEPENDENT_AMBULATORY_CARE_PROVIDER_SITE_OTHER): Payer: Medicare Other | Admitting: Family Medicine

## 2016-07-02 VITALS — BP 122/72 | HR 69 | Temp 97.4°F | Resp 17 | Ht 63.5 in | Wt 145.0 lb

## 2016-07-02 DIAGNOSIS — L03115 Cellulitis of right lower limb: Secondary | ICD-10-CM | POA: Diagnosis not present

## 2016-07-02 NOTE — Progress Notes (Signed)
Subjective:  By signing my name below, I, Linda Macdonald, attest that this documentation has been prepared under the direction and in the presence of Meredith Staggers, MD.  Electronically Signed: Andrew Au, ED Scribe. 07/02/2016. 12:05 PM.   Patient ID: Linda Macdonald, female    DOB: 01-Mar-1941, 75 y.o.   MRN: [id number]  HPI Chief Complaint  Patient presents with   Follow-up    infection on leg     HPI Comments: Linda Macdonald is a 75 y.o. female who presents to the [facility] for a follow up of cellulitis. Last seen 2 days ago. She initially noticed possible bite on right leg 10 days prior with spreading redness. OTC abx ointment. At visit  2 days erythema measured 5 x 4 cm with induration. Possible early cellulitis. Recommended Bactroban ointment then start doxycycline if not improving within 24 hours.   Since last visit pt reports worsening redness and increased warmth to right leg. She denies area being painful or itchy. She started doxycyline yesterday afternoon and took her second dose this morning. She has also been doing warm compresses and applying Bactroban 2-3 days ago.    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 Allergies  Allergen Reactions   Erythromycin    Penicillins    Prior to Admission medications   Medication Sig Start Date End Date Taking? Authorizing Provider  BIOTIN PO Take 1 tablet by mouth. 5000 mg   Yes Historical Provider, MD  calcium citrate-vitamin D (CITRACAL+D) 315-200 MG-UNIT per tablet Take 1 tablet by mouth 2 (two) times daily.   Yes Historical Provider, MD  Cholecalciferol (VITAMIN D-3) 1000 UNITS CAPS Take 1 capsule by mouth daily.   Yes Historical Provider, MD  doxycycline (VIBRA-TABS) 100 MG tablet Take 1 tablet (100 mg total) by mouth 2 (two) times daily. 06/30/16  Yes Shade Flood, MD  ESTRACE VAGINAL 0.1 MG/GM vaginal cream Twice a week 04/20/14  Yes Historical Provider, MD  Glucosamine-Chondroitin (COSAMIN DS PO) Take 1 tablet by mouth daily.   Yes Historical Provider, MD  magnesium citrate SOLN Take 1 Bottle by mouth once. 150 mg   Yes Historical Provider, MD  Multiple Vitamins-Minerals (MULTIVITAMIN PO) Take 1 tablet by mouth daily.   Yes Historical Provider, MD  mupirocin ointment (BACTROBAN) 2 % Apply 1 application topically 2 (two) times daily. 06/30/16  Yes Shade Flood, MD  Omeprazole (PRILOSEC PO) Take by mouth.   Yes Historical Provider, MD  levothyroxine (LEVOTHROID) 25 MCG tablet Take 1 tablet (25 mcg total) by mouth daily. 04/02/11  03/19/14  Jacques Navy, MD   Social History   Social History   Marital status: Widowed    Spouse name: N/A   Number of children: 3   Years of education: College   Occupational History   Not on file.   Social History Main Topics   Smoking status: Never Smoker   Smokeless tobacco: Never Used   Alcohol use Yes     Comment: socially   Drug use: No   Sexual activity: Not on file   Other Topics Concern   Not on file    Social History Narrative   Patient is a widow and lives alone with her dog.   Patient has three children.   Patient is retired.   Patient has a college education.   Patient is right-handed.   Patient is not drinking any caffeine.            Review of Systems  Constitutional: Negative for chills and fever.  Cardiovascular: Negative for leg swelling.  Musculoskeletal: Negative for gait problem and myalgias.  Skin: Positive for color change.       Objective:   Physical Exam  Constitutional: She is oriented to person, place, and time. She appears well-developed and well-nourished. No distress.  HENT:  Head: Normocephalic and atraumatic.  Eyes: Conjunctivae and EOM are normal.  Neck: Neck supple.  Cardiovascular: Normal rate.   Pulmonary/Chest: Effort normal. No respiratory distress.  Musculoskeletal: Normal range of motion.  Neurological: She is alert and oriented to person, place, and time.  Skin: Skin is warm and dry.  Erythematous patch anterior right lower leg. minimal induration. No fluctuance. Erythema measure 8.5 x 5 cm. No calf tenderness. No central opening no necrosis.  Psychiatric: She has a normal mood and affect. Her behavior is normal.  Nursing note and vitals reviewed.  Vitals:   07/02/16 1201  BP: 122/72  Pulse: 69  Resp: 17  Temp: 97.4 F (36.3 C)  TempSrc: Oral  SpO2: 99%  Weight: 145 lb (65.8 kg)  Height: 5' 3.5" (1.613 m)    Assessment & Plan:   Linda Macdonald is a 75 y.o. female Cellulitis of leg, right  - Increase in erythema since last visit. However has only had 2 doses of doxycycline. No central fluctuance, no ulceration.  -Continue doxycycline 100 mg twice a day, warm compresses. Area of erythema was outlined today. Recheck in 2 days, sooner if significantly increasing erythema. If persistent increase in erythema, consider adding Keflex for strep coverage. RTC precautions.   No orders of the defined types were placed in this  encounter.  Patient Instructions    Although the redness has spread, usually had 2 doses of the oral antibiotic. It may just require a little more time on the antibiotic. I have outlined the redness today, but return to see me on Wednesday if it is not improving by that time. If the redness considerably increases overnight, return for recheck tomorrow.  Return to the clinic or go to the nearest emergency room if any of your symptoms worsen or new symptoms occur.   Cellulitis Cellulitis is an infection of the skin and the tissue beneath it. The infected area is usually red and tender. Cellulitis occurs most often in the arms and lower legs.  CAUSES  Cellulitis is caused by bacteria that enter the skin through cracks or cuts in the skin. The most common types of bacteria that cause cellulitis are staphylococci and streptococci. SIGNS AND SYMPTOMS   Redness  and warmth.  Swelling.  Tenderness or pain.  Fever. DIAGNOSIS  Your health care provider can usually determine what is wrong based on a physical exam. Blood tests may also be done. TREATMENT  Treatment usually involves taking an antibiotic medicine. HOME CARE INSTRUCTIONS   Take your antibiotic medicine as directed by your health care provider. Finish the antibiotic even if you start to feel better.  Keep the infected arm or leg elevated to reduce swelling.  Apply a warm cloth to the affected area up to 4 times per day to relieve pain.  Take medicines only as directed by your health care provider.  Keep all follow-up visits as directed by your health care provider. SEEK MEDICAL CARE IF:   You notice red streaks coming from the infected area.  Your red area gets larger or turns dark in color.  Your bone or joint underneath the infected area becomes painful after the skin has healed.  Your infection returns in the same area or another area.  You notice a swollen bump in the infected area.  You develop new  symptoms.  You have a fever. SEEK IMMEDIATE MEDICAL CARE IF:   You feel very sleepy.  You develop vomiting or diarrhea.  You have a general ill feeling (malaise) with muscle aches and pains.   This information is not intended to replace advice given to you by your health care provider. Make sure you discuss any questions you have with your health care provider.   Document Released: 09/05/2005 Document Revised: 08/17/2015 Document Reviewed: 02/11/2012 Elsevier Interactive Patient Education 2016 [employer].    IF you received an x-ray today, you will receive an invoice from [facility]. Please contact [facility] at [phone] with questions or concerns regarding your invoice.   IF you received labwork today, you will receive an invoice from [employer] [facility]. Please contact [facility] at [phone] with questions or concerns regarding your invoice.   Our billing staff will not be able to assist you with questions regarding bills from these companies.  You will be contacted with the lab results as soon as they are available. The fastest way to get your results is to activate your My Chart account. Instructions are located on the last page of this paperwork. If you have not heard from [place] regarding the results in 2 weeks, please contact this office.        I personally performed the services described in this documentation, which was scribed in my presence. The recorded information has been reviewed and considered, and addended by me as needed.   Signed,   Meredith Staggers, MD [facility].  07/02/16 2:17 PM

## 2016-07-02 NOTE — Patient Instructions (Addendum)
  Although the redness has spread, usually had 2 doses of the oral antibiotic. It may just require a little more time on the antibiotic. I have outlined the redness today, but return to see me on Wednesday if it is not improving by that time. If the redness considerably increases overnight, return for recheck tomorrow.  Return to the clinic or go to the nearest emergency room if any of your symptoms worsen or new symptoms occur.   Cellulitis Cellulitis is an infection of the skin and the tissue beneath it. The infected area is usually red and tender. Cellulitis occurs most often in the arms and lower legs.  CAUSES  Cellulitis is caused by bacteria that enter the skin through cracks or cuts in the skin. The most common types of bacteria that cause cellulitis are staphylococci and streptococci. SIGNS AND SYMPTOMS   Redness and warmth.  Swelling.  Tenderness or pain.  Fever. DIAGNOSIS  Your health care provider can usually determine what is wrong based on a physical exam. Blood tests may also be done. TREATMENT  Treatment usually involves taking an antibiotic medicine. HOME CARE INSTRUCTIONS   Take your antibiotic medicine as directed by your health care provider. Finish the antibiotic even if you start to feel better.  Keep the infected arm or leg elevated to reduce swelling.  Apply a warm cloth to the affected area up to 4 times per day to relieve pain.  Take medicines only as directed by your health care provider.  Keep all follow-up visits as directed by your health care provider. SEEK MEDICAL CARE IF:   You notice red streaks coming from the infected area.  Your red area gets larger or turns dark in color.  Your bone or joint underneath the infected area becomes painful after the skin has healed.  Your infection returns in the same area or another area.  You notice a swollen bump in the infected area.  You develop new symptoms.  You have a fever. SEEK IMMEDIATE  MEDICAL CARE IF:   You feel very sleepy.  You develop vomiting or diarrhea.  You have a general ill feeling (malaise) with muscle aches and pains.   This information is not intended to replace advice given to you by your health care provider. Make sure you discuss any questions you have with your health care provider.   Document Released: 09/05/2005 Document Revised: 08/17/2015 Document Reviewed: 02/11/2012 Elsevier Interactive Patient Education 2016 [employer].    IF you received an x-ray today, you will receive an invoice from [facility]. Please contact [facility] at [phone] with questions or concerns regarding your invoice.   IF you received labwork today, you will receive an invoice from [employer] [facility]. Please contact [facility] at [phone] with questions or concerns regarding your invoice.   Our billing staff will not be able to assist you with questions regarding bills from these companies.  You will be contacted with the lab results as soon as they are available. The fastest way to get your results is to activate your My Chart account. Instructions are located on the last page of this paperwork. If you have not heard from [place] regarding the results in 2 weeks, please contact this office.

## 2016-07-02 NOTE — Telephone Encounter (Signed)
Patient seen in office. See visit notes.

## 2016-07-03 ENCOUNTER — Ambulatory Visit: Payer: Medicare Other

## 2016-07-04 ENCOUNTER — Ambulatory Visit (INDEPENDENT_AMBULATORY_CARE_PROVIDER_SITE_OTHER): Payer: Medicare Other | Admitting: Physician Assistant

## 2016-07-04 VITALS — BP 118/64 | HR 69 | Temp 98.2°F | Resp 16 | Ht 63.5 in | Wt 145.4 lb

## 2016-07-04 DIAGNOSIS — L03115 Cellulitis of right lower limb: Secondary | ICD-10-CM

## 2016-07-04 NOTE — Patient Instructions (Signed)
     IF you received an x-ray today, you will receive an invoice from [facility]. Please contact [facility] at [phone] with questions or concerns regarding your invoice.   IF you received labwork today, you will receive an invoice from [facility]. Please contact [facility] at [phone] with questions or concerns regarding your invoice.   Our billing staff will not be able to assist you with questions regarding bills from these companies.  You will be contacted with the lab results as soon as they are available. The fastest way to get your results is to activate your My Chart account. Instructions are located on the last page of this paperwork. If you have not heard from us regarding the results in 2 weeks, please contact this office.      

## 2016-07-04 NOTE — Progress Notes (Signed)
[facility] [address] [place] [address] [phone]  Date:  07/04/2016   Name:  Linda Macdonald   DOB:  11-17-1941   MRN:  [id number]  PCP:  Tonye Pearson, MD    History of Present Illness:  Linda Macdonald is a 75 y.o. female patient who presents to [facility] for follow up of cellulitis. Patient states that the lesion of her right leg that has been present for 16 days is improving.  She started the doxycycline 4 days ago, along with mupirocin.  Washes it 4 times per day, and places warm compresses.  She denies fever, malaise, or excessive pain.   She is compliant with abx administration. She states that it is less red, and the redness has decreased.        Patient Active Problem List   Diagnosis Date Noted  . Dysesthesia of multiple sites 03/19/2014  . Restless legs syndrome (RLS) 03/19/2014  . Anemia associated with nutritional deficiency 03/19/2014  . Hypersomnia with sleep apnea, unspecified 03/19/2014  . Acute medial meniscus tear of left knee 10/16/2012  . Leukopenia 08/12/2012  . CALCULUS OF KIDNEY 10/06/2010  . WEIGHT LOSS 08/10/2010  . DIARRHEA 08/10/2010  . ABDOMINAL PAIN RIGHT LOWER QUADRANT 08/10/2010  . ERUCTATION 04/18/2010  . HYPERSOMNIA 01/24/2010  . 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 VENOUS INSUFFICIENCY, LEGS 08/29/2009  . HYPOTHYROIDISM 04/21/2008  . DEPRESSION 04/21/2008  . SLEEP APNEA, OBSTRUCTIVE 04/21/2008  . DEEP VENOUS THROMBOPHLEBITIS, LEFT, LEG, HX OF 04/21/2008  . WISDOM TEETH EXTRACTION, HX OF 04/21/2008    Past Medical History:  Diagnosis Date  . Arthritis   . Chronic kidney disease   . Leukopenia 08/12/2012  . Thyroid disease     History reviewed. No pertinent surgical history.  Social History  Substance Use Topics  . Smoking status: Never Smoker  . Smokeless tobacco: Never Used  . Alcohol use Yes     Comment: socially    History reviewed. No pertinent family history.  Allergies   Allergen Reactions  . Erythromycin   . Penicillins     Medication list has been reviewed and updated.  Current Outpatient Prescriptions on File Prior to Visit  Medication Sig Dispense Refill  . BIOTIN PO Take 1 tablet by mouth. 5000 mg    . calcium citrate-vitamin D (CITRACAL+D) 315-200 MG-UNIT per tablet Take 1 tablet by mouth 2 (two) times daily.    . Cholecalciferol (VITAMIN D-3) 1000 UNITS CAPS Take 1 capsule by mouth daily.    Marland Kitchen doxycycline (VIBRA-TABS) 100 MG tablet Take 1 tablet (100 mg total) by mouth 2 (two) times daily. 20 tablet 0  . ESTRACE VAGINAL 0.1 MG/GM vaginal cream Twice a week    . Glucosamine-Chondroitin (COSAMIN DS PO) Take 1 tablet by mouth daily.    . magnesium citrate SOLN Take 1 Bottle by mouth once. 150 mg    . Multiple Vitamins-Minerals (MULTIVITAMIN PO) Take 1 tablet by mouth daily.    . mupirocin ointment (BACTROBAN) 2 % Apply 1 application topically 2 (two) times daily. 22 g 0  . Omeprazole (PRILOSEC PO) Take by mouth.    . levothyroxine (LEVOTHROID) 25 MCG tablet Take 1 tablet (25 mcg total) by mouth daily. 90 tablet 3   No current facility-administered medications on file prior to visit.     ROS ROS otherwise unremarkable unless listed above.   Physical Examination: BP 118/64   Pulse 69   Temp 98.2 F (36.8 C) (  Oral)   Resp 16   Ht 5' 3.5" (1.613 m)   Wt 145 lb 6.4 oz (66 kg)   SpO2 96%   BMI 25.35 kg/m  Ideal Body Weight: Weight in (lb) to have BMI = 25: 143.1  Physical Exam  Constitutional: She is oriented to person, place, and time. She appears well-developed and well-nourished. No distress.  HENT:  Head: Normocephalic and atraumatic.  Right Ear: External ear normal.  Left Ear: External ear normal.  Eyes: Conjunctivae and EOM are normal. Pupils are equal, round, and reactive to light.  Cardiovascular: Normal rate.   Pulmonary/Chest: Effort normal. No respiratory distress.  Neurological: She is alert and oriented to person, place,  and time.  Skin: She is not diaphoretic.  Mildly erythematous 4cm lesion at the medial lower leg.  It is minimally tender along minimal induration at the center.  No No drainage.  Normal gait.   Patient has a small marker line which she placed that was at the very end of her redness.  Redness has decreased abuot two inches from her mark.  The demarcation from clinic is not apparent consistent with her cleaning regimen.   Psychiatric: She has a normal mood and affect. Her behavior is normal.     Assessment and Plan: [name] is a 75 y.o. female who is here today for follow up of cellulitis. This appears to be doing better.  Line redrawn along the lower leg.  Advised continuance of antibiotics and wound care regimen.  She will return if symptoms worsen.   Cellulitis of right lower extremity  Trena Platt, PA-C [facility] 07/04/2016 8:38 AM

## 2016-07-05 ENCOUNTER — Ambulatory Visit: Payer: Medicare Other

## 2016-07-09 DIAGNOSIS — R05 Cough: Secondary | ICD-10-CM | POA: Diagnosis not present

## 2016-07-09 DIAGNOSIS — K219 Gastro-esophageal reflux disease without esophagitis: Secondary | ICD-10-CM | POA: Diagnosis not present

## 2016-07-09 DIAGNOSIS — K2950 Unspecified chronic gastritis without bleeding: Secondary | ICD-10-CM | POA: Diagnosis not present

## 2016-07-09 DIAGNOSIS — R1310 Dysphagia, unspecified: Secondary | ICD-10-CM | POA: Diagnosis not present

## 2016-07-12 DIAGNOSIS — L649 Androgenic alopecia, unspecified: Secondary | ICD-10-CM | POA: Diagnosis not present

## 2016-07-12 DIAGNOSIS — I788 Other diseases of capillaries: Secondary | ICD-10-CM | POA: Diagnosis not present

## 2016-07-12 DIAGNOSIS — Z419 Encounter for procedure for purposes other than remedying health state, unspecified: Secondary | ICD-10-CM | POA: Diagnosis not present

## 2016-07-12 DIAGNOSIS — L738 Other specified follicular disorders: Secondary | ICD-10-CM | POA: Diagnosis not present

## 2016-07-12 DIAGNOSIS — L814 Other melanin hyperpigmentation: Secondary | ICD-10-CM | POA: Diagnosis not present

## 2016-09-26 DIAGNOSIS — Z01419 Encounter for gynecological examination (general) (routine) without abnormal findings: Secondary | ICD-10-CM | POA: Diagnosis not present

## 2016-09-26 DIAGNOSIS — E039 Hypothyroidism, unspecified: Secondary | ICD-10-CM | POA: Diagnosis not present

## 2016-10-11 DIAGNOSIS — R1013 Epigastric pain: Secondary | ICD-10-CM | POA: Diagnosis not present

## 2016-10-11 DIAGNOSIS — R1310 Dysphagia, unspecified: Secondary | ICD-10-CM | POA: Diagnosis not present

## 2016-10-11 DIAGNOSIS — K219 Gastro-esophageal reflux disease without esophagitis: Secondary | ICD-10-CM | POA: Diagnosis not present

## 2016-10-11 DIAGNOSIS — Z86010 Personal history of colonic polyps: Secondary | ICD-10-CM | POA: Diagnosis not present

## 2016-10-24 DIAGNOSIS — H538 Other visual disturbances: Secondary | ICD-10-CM | POA: Diagnosis not present

## 2016-10-24 DIAGNOSIS — Z803 Family history of malignant neoplasm of breast: Secondary | ICD-10-CM | POA: Diagnosis not present

## 2016-10-24 DIAGNOSIS — I679 Cerebrovascular disease, unspecified: Secondary | ICD-10-CM | POA: Diagnosis not present

## 2016-10-24 DIAGNOSIS — H1011 Acute atopic conjunctivitis, right eye: Secondary | ICD-10-CM | POA: Diagnosis not present

## 2016-10-24 DIAGNOSIS — Z1231 Encounter for screening mammogram for malignant neoplasm of breast: Secondary | ICD-10-CM | POA: Diagnosis not present

## 2016-10-24 DIAGNOSIS — H532 Diplopia: Secondary | ICD-10-CM | POA: Diagnosis not present

## 2016-12-24 DIAGNOSIS — Z23 Encounter for immunization: Secondary | ICD-10-CM | POA: Diagnosis not present

## 2017-02-05 DIAGNOSIS — R1012 Left upper quadrant pain: Secondary | ICD-10-CM | POA: Diagnosis not present

## 2017-02-05 DIAGNOSIS — K219 Gastro-esophageal reflux disease without esophagitis: Secondary | ICD-10-CM | POA: Diagnosis not present

## 2017-02-05 DIAGNOSIS — Z86010 Personal history of colonic polyps: Secondary | ICD-10-CM | POA: Diagnosis not present

## 2017-02-26 DIAGNOSIS — L308 Other specified dermatitis: Secondary | ICD-10-CM | POA: Diagnosis not present

## 2017-02-26 DIAGNOSIS — L821 Other seborrheic keratosis: Secondary | ICD-10-CM | POA: Diagnosis not present

## 2017-02-26 DIAGNOSIS — L738 Other specified follicular disorders: Secondary | ICD-10-CM | POA: Diagnosis not present

## 2017-02-26 DIAGNOSIS — L820 Inflamed seborrheic keratosis: Secondary | ICD-10-CM | POA: Diagnosis not present

## 2017-03-28 ENCOUNTER — Other Ambulatory Visit: Payer: Self-pay | Admitting: Gastroenterology

## 2017-03-28 DIAGNOSIS — K5901 Slow transit constipation: Secondary | ICD-10-CM | POA: Diagnosis not present

## 2017-03-28 DIAGNOSIS — R1032 Left lower quadrant pain: Secondary | ICD-10-CM | POA: Diagnosis not present

## 2017-03-28 DIAGNOSIS — R140 Abdominal distension (gaseous): Secondary | ICD-10-CM | POA: Diagnosis not present

## 2017-04-09 ENCOUNTER — Other Ambulatory Visit: Payer: Self-pay | Admitting: Gastroenterology

## 2017-04-09 ENCOUNTER — Ambulatory Visit
Admission: RE | Admit: 2017-04-09 | Discharge: 2017-04-09 | Disposition: A | Discharge status: Home / Self Care | Payer: Medicare Other | Source: Ambulatory Visit | Attending: Gastroenterology | Admitting: Gastroenterology

## 2017-04-09 DIAGNOSIS — R935 Abnormal findings on diagnostic imaging of other abdominal regions, including retroperitoneum: Secondary | ICD-10-CM

## 2017-04-09 DIAGNOSIS — R1032 Left lower quadrant pain: Secondary | ICD-10-CM

## 2017-04-09 DIAGNOSIS — K869 Disease of pancreas, unspecified: Secondary | ICD-10-CM

## 2017-04-09 MED ORDER — IOPAMIDOL (ISOVUE-300) INJECTION 61%
100.0000 mL | Freq: Once | INTRAVENOUS | Status: AC | PRN
  Administered 2017-04-09: 100 mL via INTRAVENOUS

## 2017-04-17 ENCOUNTER — Inpatient Hospital Stay
Admission: RE | Admit: 2017-04-17 | Discharge: 2017-04-17 | Disposition: A | Discharge status: Home / Self Care | Payer: Medicare Other | Source: Ambulatory Visit | Attending: Gastroenterology | Admitting: Gastroenterology

## 2017-04-18 DIAGNOSIS — N200 Calculus of kidney: Secondary | ICD-10-CM | POA: Diagnosis not present

## 2017-04-22 ENCOUNTER — Other Ambulatory Visit: Payer: Self-pay | Admitting: Urology

## 2017-04-22 ENCOUNTER — Other Ambulatory Visit: Payer: Medicare Other

## 2017-04-23 ENCOUNTER — Encounter (HOSPITAL_COMMUNITY): Payer: Self-pay | Admitting: *Deleted

## 2017-04-25 ENCOUNTER — Encounter (HOSPITAL_COMMUNITY)
Admission: RE | Disposition: A | Discharge status: Home / Self Care | Payer: Self-pay | Source: Ambulatory Visit | Attending: Urology

## 2017-04-25 ENCOUNTER — Encounter (HOSPITAL_COMMUNITY): Payer: Self-pay

## 2017-04-25 ENCOUNTER — Ambulatory Visit (HOSPITAL_COMMUNITY)
Admission: RE | Admit: 2017-04-25 | Discharge: 2017-04-25 | Disposition: A | Discharge status: Home / Self Care | Payer: Medicare Other | Source: Ambulatory Visit | Attending: Urology | Admitting: Urology

## 2017-04-25 ENCOUNTER — Ambulatory Visit (HOSPITAL_COMMUNITY): Payer: Medicare Other

## 2017-04-25 DIAGNOSIS — E039 Hypothyroidism, unspecified: Secondary | ICD-10-CM | POA: Insufficient documentation

## 2017-04-25 DIAGNOSIS — F17200 Nicotine dependence, unspecified, uncomplicated: Secondary | ICD-10-CM | POA: Insufficient documentation

## 2017-04-25 DIAGNOSIS — Z79899 Other long term (current) drug therapy: Secondary | ICD-10-CM | POA: Diagnosis not present

## 2017-04-25 DIAGNOSIS — N200 Calculus of kidney: Secondary | ICD-10-CM | POA: Diagnosis not present

## 2017-04-25 DIAGNOSIS — F329 Major depressive disorder, single episode, unspecified: Secondary | ICD-10-CM | POA: Insufficient documentation

## 2017-04-25 DIAGNOSIS — G4730 Sleep apnea, unspecified: Secondary | ICD-10-CM | POA: Diagnosis not present

## 2017-04-25 DIAGNOSIS — Z880 Allergy status to penicillin: Secondary | ICD-10-CM | POA: Diagnosis not present

## 2017-04-25 HISTORY — PX: EXTRACORPOREAL SHOCK WAVE LITHOTRIPSY: SHX1557

## 2017-04-25 SURGERY — LITHOTRIPSY, ESWL
Anesthesia: LOCAL | Laterality: Left

## 2017-04-25 MED ORDER — LEVOFLOXACIN 500 MG PO TABS
500.0000 mg | ORAL_TABLET | ORAL | Status: AC
  Administered 2017-04-25: 500 mg via ORAL
  Filled 2017-04-25: qty 1

## 2017-04-25 MED ORDER — DIAZEPAM 5 MG PO TABS
10.0000 mg | ORAL_TABLET | ORAL | Status: AC
  Administered 2017-04-25: 10 mg via ORAL
  Filled 2017-04-25: qty 2

## 2017-04-25 MED ORDER — DIPHENHYDRAMINE HCL 25 MG PO CAPS
25.0000 mg | ORAL_CAPSULE | ORAL | Status: AC
  Administered 2017-04-25: 25 mg via ORAL
  Filled 2017-04-25: qty 1

## 2017-04-25 MED ORDER — SODIUM CHLORIDE 0.9 % IV SOLN
INTRAVENOUS | Status: DC
  Administered 2017-04-25 (×2): via INTRAVENOUS

## 2017-04-25 NOTE — H&P (Signed)
 @media  print    Office Visit Report 04/18/2017    Gardiner Barefootaroline G. Macdonald         MRN: [id number]  PRIMARY CARE:  Illene RegulusMichael Norins, MD  DOB: 1941-08-15, 76 year old Female  REFERRING:  Illene RegulusMichael Norins, MD  SSN: [id number]  PROVIDER:  Marcine MatarStephen Dahlstedt, M.D.    LOCATION:  [facility] [phone]    CC: I have kidney stones.  HPI: Linda Macdonald is a 76 year-old female patient who was referred by Dr. Illene RegulusMichael Norins, MD who is here for renal calculi.  The problem is on the left side. This is not her first kidney stone. She has had 1 stones prior to getting this one. She is currently having flank pain and back pain. She denies having groin pain, nausea, vomiting, fever, and chills.   76 year old female sent by Dr. Loreta AveMann for evaluation and management of a large left renal stone. This was recently diagnosed with CT scan performed for intermittent leside.n 2011 she had a similar stone on the other side.n 2011 she had a similar stone on the other side. The stone measured approximately 16 mm in size, this one measures 17 mm in size. She has had intermittent hematuria as well.     ALLERGIES: Erythromycin TABS Penicillins    MEDICATIONS: Biotin CAPS Oral  Estrace CREA Vaginal  Multi-Vitamin TABS Oral  Synthroid TABS Oral     GU PSH: ESWL - about 2015      PSH Notes: Knee Surgery, Knee Surgery   NON-GU PSH: Bilateral Tubal Ligation Knee Arthroscopy, Bilateral        GU PMH: Renal calculus, Kidney stone on right side - 2014      PMH Notes:  1898-12-10 00:00:00 - Note: Normal Routine History And Physical Senior Citizen (65-80)  2010-10-27 13:33:21 - Note: Thrombophlebitis Of The Left Tibial Vein   NON-GU PMH: Anxiety, Anxiety (Symptom) - 2014 Personal history of other diseases of the nervous system and sense organs, History of sleep apnea - 2014 Personal history of other endocrine, nutritional and metabolic disease, History of hypothyroidism - 2014 Personal history of other  mental and behavioral disorders, History of depression - 2014    FAMILY HISTORY: 3 Son's - Son Breast Cancer - Sister Colon Cancer - Grandmother Death In The Family Father - Father Death In The Family Mother - Mother Family Health Status Number - Brother nephrolithiasis - Runs in Family, Brother   SOCIAL HISTORY: Marital Status: Widowed Current Smoking Status: Patient has never smoked.  <DIV'  Tobacco Use Assessment Completed:  Used Tobacco in last 30 days?   Has never drank.  Does not drink caffeine.     Notes: Alcohol Use, Tobacco Use, Caffeine Use, Marital History - Widowed   REVIEW OF SYSTEMS:     GU Review Female:  Patient denies frequent urination, hard to postpone urination, burning /pain with urination, get up at night to urinate, leakage of urine, stream starts and stops, trouble starting your stream, have to strain to urinate, and currently pregnant.    Gastrointestinal (Upper):  Patient reports nausea and indigestion/ heartburn. Patient denies vomiting.    Gastrointestinal (Lower):  Patient reports constipation. Patient denies diarrhea.    Constitutional:  Patient denies fever, night sweats, weight loss, and fatigue.    Skin:  Patient denies skin rash/ lesion and itching.    Eyes:  Patient reports double vision. Patient denies blurred vision.    Ears/ Nose/ Throat:  Patient denies sore throat and sinus problems.  Hematologic/Lymphatic:  Patient denies swollen glands and easy bruising.    Cardiovascular:  Patient denies leg swelling and chest pains.    Respiratory:  Patient denies cough and shortness of breath.    Endocrine:  Patient denies excessive thirst.    Musculoskeletal:  Patient denies back pain and joint pain.    Neurological:  Patient denies headaches and dizziness.    Psychologic:  Patient denies depression and anxiety.    VITAL SIGNS:       04/18/2017 10:41 AM     Weight 138 lb / 62.6 kg     Height 65 in / 165.1 cm     BP 112/71 mmHg     Pulse 62 /min      Temperature 97.6 F / 36 C     BMI 23.0 kg/m     MULTI-SYSTEM PHYSICAL EXAMINATION:      Constitutional: Well-nourished. No physical deformities. Normally developed. Good grooming.     Neck: Neck symmetrical, not swollen. Normal tracheal position.     Respiratory: No labored breathing, no use of accessory muscles.      Skin: No paleness, no jaundice, no cyanosis. No lesion, no ulcer, no rash.     Neurologic / Psychiatric: Oriented to time, oriented to place, oriented to person. No depression, no anxiety, no agitation.     Eyes: Normal conjunctivae. Normal eyelids.     Ears, Nose, Mouth, and Throat: Left ear no scars, no lesions, no masses. Right ear no scars, no lesions, no masses. Nose no scars, no lesions, no masses. Normal hearing. Normal lips.     Musculoskeletal: Normal gait and station of head and neck.            PAST DATA REVIEWED:   Source Of History:  Patient  X-Ray Review: C.T. Abdomen/Pelvis: Reviewed Films. Discussed With Patient. stone size 17 mm by 8 mm. Hounsfield units 1050. Skin to stone distance 75 mm. This can be seen on a KUB.    PROCEDURES: None   ASSESSMENT:     ICD-10 Details  1 GU:  Renal calculus - N20.0 Large left renal stone. Intermittently symptomatic. Obviously, it is been there for quite some time. She has no significant hydronephrosis.   PLAN:   Medications  New Meds: Percocet 5 mg-325 mg tablet 1 tablet PO Q 4 H #15 0 Refill(s)    Schedule  Return Visit/Planned Activity: Other See Visit Notes - Schedule Surgery  Note: pt wants to do ESL w/ me when she gets back from trip in June  Document  Letter(s):  Created for Patient: Clinical Summary   Notes:  I had a long discussion with the patient regarding treatment options. These include percutaneous nephrolithotomy, which is the best one treatment for such a large stone. However, it is an invasive procedure, involving anesthesia and risk of bleeding/kidney complications. The other option which worked  well on her contralateral stones 7 years ago his lithotripsy, although she has a high risk of retreatment and Steinstrasse. She did quite well with her first treatment with a similar size stone. She would like to proceed with shockwave lithotripsy. She has an upcoming trip to [place] in about 3 weeks, and I have recommended that she try to get this done after her trip has been completed. I've also discussed possible stent placement before the procedure. We will try to go without stent placement, however. She was given a prescription for Percocet.   E & M CODE: I spent at least 30 minutes  face to face with the patient, more than 50% of that time was spent on counseling and/or coordinating care.   * Signed by Marcine Matar, M.D. on 04/18/17 at 11:39 AM (EDT)*

## 2017-04-25 NOTE — Discharge Instructions (Addendum)
Moderate Conscious Sedation, Adult, Care After °These instructions provide you with information about caring for yourself after your procedure. Your health care provider may also give you more specific instructions. Your treatment has been planned according to current medical practices, but problems sometimes occur. Call your health care provider if you have any problems or questions after your procedure. °What can I expect after the procedure? °After your procedure, it is common: °· To feel sleepy for several hours. °· To feel clumsy and have poor balance for several hours. °· To have poor judgment for several hours. °· To vomit if you eat too soon. °Follow these instructions at home: °For at least 24 hours after the procedure:  ° °· Do not: °¨ Participate in activities where you could fall or become injured. °¨ Drive. °¨ Use heavy machinery. °¨ Drink alcohol. °¨ Take sleeping pills or medicines that cause drowsiness. °¨ Make important decisions or sign legal documents. °¨ Take care of children on your own. °· Rest. °Eating and drinking  °· Follow the diet recommended by your health care provider. °· If you vomit: °¨ Drink water, juice, or soup when you can drink without vomiting. °¨ Make sure you have little or no nausea before eating solid foods. °General instructions  °· Have a responsible adult stay with you until you are awake and alert. °· Take over-the-counter and prescription medicines only as told by your health care provider. °· If you smoke, do not smoke without supervision. °· Keep all follow-up visits as told by your health care provider. This is important. °Contact a health care provider if: °· You keep feeling nauseous or you keep vomiting. °· You feel light-headed. °· You develop a rash. °· You have a fever. °Get help right away if: °· You have trouble breathing. °This information is not intended to replace advice given to you by your health care provider. Make sure you discuss any questions you  have with your health care provider. °Document Released: 09/16/2013 Document Revised: 04/30/2016 Document Reviewed: 03/17/2016 °Elsevier Interactive Patient Education © 2017 [employer] °Lithotripsy, Care After °This sheet gives you information about how to care for yourself after your procedure. Your health care provider may also give you more specific instructions. If you have problems or questions, contact your health care provider. °What can I expect after the procedure? °After the procedure, it is common to have: °· Some blood in your urine. This should only last for a few days. °· Soreness in your back, sides, or upper abdomen for a few days. °· Blotches or bruises on your back where the pressure wave entered the skin. °· Pain, discomfort, or nausea when pieces (fragments) of the kidney stone move through the tube that carries urine from the kidney to the bladder (ureter). Stone fragments may pass soon after the procedure, but they may continue to pass for up to 4-8 weeks. °¨ If you have severe pain or nausea, contact your health care provider. This may be caused by a large stone that was not broken up, and this may mean that you need more treatment. °· Some pain or discomfort during urination. °· Some pain or discomfort in the lower abdomen or (in men) at the base of the penis. °Follow these instructions at home: °Medicines  °· Take over-the-counter and prescription medicines only as told by your health care provider. °· If you were prescribed an antibiotic medicine, take it as told by your health care provider. Do not stop taking the antibiotic even   if you start to feel better. °· Do not drive for 24 hours if you were given a medicine to help you relax (sedative). °· Do not drive or use heavy machinery while taking prescription pain medicine. °Eating and drinking  °· Drink enough water and fluids to keep your urine clear or pale yellow. This helps any remaining pieces of the stone to pass. It can also help  prevent new stones from forming. °· Eat plenty of fresh fruits and vegetables. °· Follow instructions from your health care provider about eating and drinking restrictions. You may be instructed: °¨ To reduce how much salt (sodium) you eat or drink. Check ingredients and nutrition facts on packaged foods and beverages. °¨ To reduce how much meat you eat. °· Eat the recommended amount of calcium for your age and gender. Ask your health care provider how much calcium you should have. °General instructions  °· Get plenty of rest. °· Most people can resume normal activities 1-2 days after the procedure. Ask your health care provider what activities are safe for you. °· If directed, strain all urine through the strainer that was provided by your health care provider. °¨ Keep all fragments for your health care provider to see. Any stones that are found may be sent to a medical lab for examination. The stone may be as small as a grain of salt. °· Keep all follow-up visits as told by your health care provider. This is important. °Contact a health care provider if: °· You have pain that is severe or does not get better with medicine. °· You have nausea that is severe or does not go away. °· You have blood in your urine longer than your health care provider told you to expect. °· You have more blood in your urine. °· You have pain during urination that does not go away. °· You urinate more frequently than usual and this does not go away. °· You develop a rash or any other possible signs of an allergic reaction. °Get help right away if: °· You have severe pain in your back, sides, or upper abdomen. °· You have severe pain while urinating. °· Your urine is very dark red. °· You have blood in your stool (feces). °· You cannot pass any urine at all. °· You feel a strong urge to urinate after emptying your bladder. °· You have a fever or chills. °· You develop shortness of breath, difficulty breathing, or chest pain. °· You have  severe nausea that leads to persistent vomiting. °· You faint. °Summary °· After this procedure, it is common to have some pain, discomfort, or nausea when pieces (fragments) of the kidney stone move through the tube that carries urine from the kidney to the bladder (ureter). If this pain or nausea is severe, however, you should contact your health care provider. °· Most people can resume normal activities 1-2 days after the procedure. Ask your health care provider what activities are safe for you. °· Drink enough water and fluids to keep your urine clear or pale yellow. This helps any remaining pieces of the stone to pass, and it can help prevent new stones from forming. °· If directed, strain your urine and keep all fragments for your health care provider to see. Fragments or stones may be as small as a grain of salt. °· Get help right away if you have severe pain in your back, sides, or upper abdomen or have severe pain while urinating. °This information is not   intended to replace advice given to you by your health care provider. Make sure you discuss any questions you have with your health care provider. °Document Released: 12/16/2007 Document Revised: 10/17/2016 Document Reviewed: 10/17/2016 °Elsevier Interactive Patient Education © 2017 Elsevier Inc. °1. You should strain your urine and collect all fragments and bring them to your follow up appointment.  °2. You should take your pain medication as needed.  Please call if your pain is severe to the point that it is not controlled with your pain medication. °3. You should call if you develop fever > 101 or persistent nausea or vomiting. °4. Your doctor may prescribe tamsulosin to take to help facilitate stone passage. °

## 2017-04-25 NOTE — Interval H&P Note (Signed)
History and Physical Interval Note:  04/25/2017 11:04 AM  Linda Macdonald  has presented today for surgery, with the diagnosis of LEFT RENAL STONES  The various methods of treatment have been discussed with the patient and family. After consideration of risks, benefits and other options for treatment, the patient has consented to  Procedure(s): LEFT EXTRACORPOREAL SHOCK WAVE LITHOTRIPSY (ESWL) (Left) as a surgical intervention .  The patient's history has been reviewed, patient examined, no change in status, stable for surgery.  I have reviewed the patient's chart and labs.  Questions were answered to the patient's satisfaction.     Decorian Schuenemann,LES

## 2017-04-25 NOTE — Progress Notes (Signed)
Contacted Dr Laverle PatterBorden in regards to pts inability to urinate; Dr Laverle PatterBorden instructed to bladder scan pt if less than 250 mls of urine in bladder pt can be discharged; instructed to return call to MD if greater than 250 mls; Dr Laverle PatterBorden was recontacted due to bladder scan noting 431 mls; order given to in and out cath pt; in and out cath preformed per RN and NT Amber with return of 500 mls of bloody urine; then pt ambulated to restroom and voided bloody urine. Pt dressed and discharged home.

## 2017-04-25 NOTE — Op Note (Signed)
See scanned chart for ESWL operative note. 

## 2017-04-26 ENCOUNTER — Inpatient Hospital Stay
Admission: RE | Admit: 2017-04-26 | Discharge: 2017-04-26 | Disposition: A | Discharge status: Home / Self Care | Payer: Medicare Other | Source: Ambulatory Visit | Attending: Gastroenterology | Admitting: Gastroenterology

## 2017-04-26 ENCOUNTER — Encounter (HOSPITAL_COMMUNITY): Payer: Self-pay | Admitting: Urology

## 2017-04-30 DIAGNOSIS — N202 Calculus of kidney with calculus of ureter: Secondary | ICD-10-CM | POA: Diagnosis not present

## 2017-04-30 DIAGNOSIS — N200 Calculus of kidney: Secondary | ICD-10-CM | POA: Diagnosis not present

## 2017-05-01 ENCOUNTER — Other Ambulatory Visit: Payer: Medicare Other

## 2017-05-07 DIAGNOSIS — N200 Calculus of kidney: Secondary | ICD-10-CM | POA: Diagnosis not present

## 2017-05-15 DIAGNOSIS — N200 Calculus of kidney: Secondary | ICD-10-CM | POA: Diagnosis not present

## 2017-05-24 ENCOUNTER — Ambulatory Visit
Admission: RE | Admit: 2017-05-24 | Discharge: 2017-05-24 | Disposition: A | Discharge status: Home / Self Care | Payer: Medicare Other | Source: Ambulatory Visit | Attending: Gastroenterology | Admitting: Gastroenterology

## 2017-05-24 DIAGNOSIS — K869 Disease of pancreas, unspecified: Secondary | ICD-10-CM

## 2017-05-24 DIAGNOSIS — R935 Abnormal findings on diagnostic imaging of other abdominal regions, including retroperitoneum: Secondary | ICD-10-CM

## 2017-05-24 DIAGNOSIS — K862 Cyst of pancreas: Secondary | ICD-10-CM | POA: Diagnosis not present

## 2017-05-24 MED ORDER — GADOBENATE DIMEGLUMINE 529 MG/ML IV SOLN
13.0000 mL | Freq: Once | INTRAVENOUS | Status: AC | PRN
  Administered 2017-05-24: 13 mL via INTRAVENOUS

## 2017-05-27 DIAGNOSIS — N200 Calculus of kidney: Secondary | ICD-10-CM | POA: Diagnosis not present

## 2017-06-19 DIAGNOSIS — N200 Calculus of kidney: Secondary | ICD-10-CM | POA: Diagnosis not present

## 2017-09-16 DIAGNOSIS — G9009 Other idiopathic peripheral autonomic neuropathy: Secondary | ICD-10-CM | POA: Diagnosis not present

## 2017-09-19 ENCOUNTER — Ambulatory Visit: Payer: Medicare Other | Admitting: Neurology

## 2017-10-01 ENCOUNTER — Encounter: Payer: Self-pay | Admitting: Neurology

## 2017-10-01 ENCOUNTER — Ambulatory Visit (INDEPENDENT_AMBULATORY_CARE_PROVIDER_SITE_OTHER): Payer: Medicare Other | Admitting: Neurology

## 2017-10-01 DIAGNOSIS — Z0289 Encounter for other administrative examinations: Secondary | ICD-10-CM

## 2017-10-01 DIAGNOSIS — R202 Paresthesia of skin: Secondary | ICD-10-CM | POA: Diagnosis not present

## 2017-10-01 NOTE — Progress Notes (Signed)
Please refer to EMG and nerve conduction study procedure note. 

## 2017-10-01 NOTE — Procedures (Signed)
     HISTORY:  Linda Macdonald is a 76 year old patient with a two-week history of paresthesias involving all 4 extremities and an associated gait disturbance. The patient feels imbalanced, she has not had any falls. She denies any neck pain or low back pain or pain down into the extremities. She is being evaluated for the sensory alteration.  NERVE CONDUCTION STUDIES:  Nerve conduction studies were performed on the right upper extremity. The distal motor latencies and motor amplitudes for the median and ulnar nerves were within normal limits. The F wave latencies and nerve conduction velocities for these nerves were also normal. The sensory latencies for the median and ulnar nerves were normal.  Nerve conduction studies were performed on both lower extremities. The distal motor latencies and motor amplitudes for the peroneal and posterior tibial nerves were within normal limits. The nerve conduction velocities for these nerves were also normal. The H reflex latencies were normal. The sensory latencies for the peroneal nerves were within normal limits.   EMG STUDIES:  EMG study was performed on the right lower extremity:  The tibialis anterior muscle reveals 2 to 5K motor units with slightly decreased recruitment. No fibrillations or positive waves were seen. The peroneus tertius muscle reveals 2 to 4K motor units with full recruitment. No fibrillations or positive waves were seen. The medial gastrocnemius muscle reveals 1 to 3K motor units with full recruitment. No fibrillations or positive waves were seen. The vastus lateralis muscle reveals 2 to 4K motor units with full recruitment. No fibrillations or positive waves were seen. The iliopsoas muscle reveals 2 to 4K motor units with full recruitment. No fibrillations or positive waves were seen. The biceps femoris muscle (long head) reveals 2 to 4K motor units with full recruitment. No fibrillations or positive waves were seen. The  lumbosacral paraspinal muscles were tested at 3 levels, and revealed no abnormalities of insertional activity at all 3 levels tested. There was good relaxation.   IMPRESSION:  Nerve conduction studies were performed on the right upper extremity and both lower extremities. No evidence of a peripheral neuropathy was noted. EMG study of the right lower extremity was unremarkable, no evidence of an overlying lumbosacral radiculopathy was seen.  Linda Macdonald. Keith Willis MD 10/01/2017 1:55 PM  [facility] [address] [place] [id number]  Phone [phone] Fax [phone]

## 2017-10-01 NOTE — Progress Notes (Signed)
[facility]    Nerve / Sites Muscle Latency Ref. Amplitude Ref. Rel Amp Segments Distance Velocity Ref. Area    ms ms mV mV %  cm m/s m/s mVms  R Median - APB     Wrist APB 3.1 ?4.4 11.2 ?4.0 100 Wrist - APB 7   35.8     Upper arm APB 6.6  10.0  89.3 Upper arm - Wrist 23 65 ?49 31.8  R Ulnar - ADM     Wrist ADM 2.4 ?3.3 8.3 ?6.0 100 Wrist - ADM 7   27.3     B.Elbow ADM 6.3  7.4  88.5 B.Elbow - Wrist 21 55 ?49 26.4     A.Elbow ADM 7.8  7.4  99.9 A.Elbow - B.Elbow 10 66 ?49 27.2         A.Elbow - Wrist      L Peroneal - EDB     Ankle EDB 4.5 ?6.5 5.8 ?2.0 100 Ankle - EDB 9   12.9     Fib head EDB 11.8  4.7  80.2 Fib head - Ankle 38 52 ?44 12.1     Pop fossa EDB 13.7  4.5  96 Pop fossa - Fib head 10 53 ?44 11.9         Pop fossa - Ankle      R Peroneal - EDB     Ankle EDB 4.5 ?6.5 6.0 ?2.0 100 Ankle - EDB 9   18.2     Fib head EDB 10.4  6.0  101 Fib head - Ankle 37 63 ?44 17.5     Pop fossa EDB 11.9  5.4  88.7 Pop fossa - Fib head 10 66 ?44 16.4         Pop fossa - Ankle      L Tibial - AH     Ankle AH 3.0 ?5.8 8.7 ?4.0 100 Ankle - AH 9   19.3     Pop fossa AH 10.9  5.2  59.3 Pop fossa - Ankle 42 53 ?41 13.1  R Tibial - AH     Ankle AH 3.2 ?5.8 14.4 ?4.0 100 Ankle - AH 9   29.1     Pop fossa AH 11.6  8.7  60.9 Pop fossa - Ankle 41 49 ?41 22.6                 SNC    Nerve / Sites Rec. Site Peak Lat Ref.  Amp Ref. Segments Distance    ms ms V V  cm  R Superficial peroneal - Ankle     Lat leg Ankle 2.1 ?4.4 16 ?6 Lat leg - Ankle 14  L Superficial peroneal - Ankle     Lat leg Ankle 2.2 ?4.4 41 ?6 Lat leg - Ankle 14         SNC    Nerve / Sites Rec. Site Peak Lat Amp Segments Distance    ms V  cm  R Median - Orthodromic (Dig II, Mid palm)     Dig II Wrist 3.6 30 Dig II - Wrist 13  R Ulnar - Orthodromic, (Dig V, Mid palm)     Dig V Wrist 3.1 18 Dig V - Wrist 111         F  Wave    Nerve F Lat Ref.   ms ms  R Median - APB 23.3 ?31.0  R Ulnar - ADM 25.7 ?32.0  H  Reflex    Nerve H Lat Lat Hmax   ms ms   Left Right Ref. Left Right Ref.  Tibial - Soleus 31.8 30.8 ?35.0 31.8 19.9 ?35.0

## 2017-10-25 DIAGNOSIS — H1859 Other hereditary corneal dystrophies: Secondary | ICD-10-CM | POA: Diagnosis not present

## 2017-10-25 DIAGNOSIS — H04123 Dry eye syndrome of bilateral lacrimal glands: Secondary | ICD-10-CM | POA: Diagnosis not present

## 2017-10-25 DIAGNOSIS — H532 Diplopia: Secondary | ICD-10-CM | POA: Diagnosis not present

## 2017-10-25 DIAGNOSIS — H5022 Vertical strabismus, left eye: Secondary | ICD-10-CM | POA: Diagnosis not present

## 2017-11-14 DIAGNOSIS — I679 Cerebrovascular disease, unspecified: Secondary | ICD-10-CM | POA: Diagnosis not present

## 2017-11-14 DIAGNOSIS — H5000 Unspecified esotropia: Secondary | ICD-10-CM | POA: Diagnosis not present

## 2017-11-14 DIAGNOSIS — H532 Diplopia: Secondary | ICD-10-CM | POA: Diagnosis not present

## 2017-11-14 DIAGNOSIS — H538 Other visual disturbances: Secondary | ICD-10-CM | POA: Diagnosis not present

## 2017-11-27 DIAGNOSIS — E039 Hypothyroidism, unspecified: Secondary | ICD-10-CM | POA: Diagnosis not present

## 2017-11-27 DIAGNOSIS — F39 Unspecified mood [affective] disorder: Secondary | ICD-10-CM | POA: Diagnosis not present

## 2017-11-27 DIAGNOSIS — H6990 Unspecified Eustachian tube disorder, unspecified ear: Secondary | ICD-10-CM | POA: Diagnosis not present

## 2017-11-27 DIAGNOSIS — M6281 Muscle weakness (generalized): Secondary | ICD-10-CM | POA: Diagnosis not present

## 2017-12-11 DIAGNOSIS — F39 Unspecified mood [affective] disorder: Secondary | ICD-10-CM | POA: Diagnosis not present

## 2017-12-11 DIAGNOSIS — H6983 Other specified disorders of Eustachian tube, bilateral: Secondary | ICD-10-CM | POA: Diagnosis not present

## 2017-12-11 DIAGNOSIS — R42 Dizziness and giddiness: Secondary | ICD-10-CM | POA: Diagnosis not present

## 2017-12-11 DIAGNOSIS — M6281 Muscle weakness (generalized): Secondary | ICD-10-CM | POA: Diagnosis not present

## 2017-12-24 ENCOUNTER — Ambulatory Visit
Admission: RE | Admit: 2017-12-24 | Discharge: 2017-12-24 | Disposition: A | Discharge status: Home / Self Care | Payer: Medicare Other | Source: Ambulatory Visit | Attending: Family Medicine | Admitting: Family Medicine

## 2017-12-24 ENCOUNTER — Other Ambulatory Visit: Payer: Self-pay | Admitting: Family Medicine

## 2017-12-24 DIAGNOSIS — E039 Hypothyroidism, unspecified: Secondary | ICD-10-CM | POA: Diagnosis not present

## 2017-12-24 DIAGNOSIS — I10 Essential (primary) hypertension: Secondary | ICD-10-CM | POA: Diagnosis not present

## 2017-12-24 DIAGNOSIS — R42 Dizziness and giddiness: Secondary | ICD-10-CM | POA: Diagnosis not present

## 2017-12-24 DIAGNOSIS — G459 Transient cerebral ischemic attack, unspecified: Secondary | ICD-10-CM

## 2017-12-24 DIAGNOSIS — M6281 Muscle weakness (generalized): Secondary | ICD-10-CM | POA: Diagnosis not present

## 2017-12-24 DIAGNOSIS — F39 Unspecified mood [affective] disorder: Secondary | ICD-10-CM | POA: Diagnosis not present

## 2017-12-25 DIAGNOSIS — N200 Calculus of kidney: Secondary | ICD-10-CM | POA: Diagnosis not present

## 2018-01-14 DIAGNOSIS — M6281 Muscle weakness (generalized): Secondary | ICD-10-CM | POA: Diagnosis not present

## 2018-01-14 DIAGNOSIS — F39 Unspecified mood [affective] disorder: Secondary | ICD-10-CM | POA: Diagnosis not present

## 2018-01-14 DIAGNOSIS — R42 Dizziness and giddiness: Secondary | ICD-10-CM | POA: Diagnosis not present

## 2018-01-14 DIAGNOSIS — H6990 Unspecified Eustachian tube disorder, unspecified ear: Secondary | ICD-10-CM | POA: Diagnosis not present

## 2018-01-23 ENCOUNTER — Telehealth: Payer: Self-pay | Admitting: Neurology

## 2018-01-23 NOTE — Telephone Encounter (Signed)
Yes, I am - CD 

## 2018-01-23 NOTE — Telephone Encounter (Signed)
Good afternoon. We received a referral on this pt for balance issues. She has seen Dr. Vickey Hugerohmeier in the past, but is requesting to see Dr. Lucia GaskinsAhern for this. Are you both ok with this?

## 2018-01-23 NOTE — Telephone Encounter (Signed)
That's fine, thanks!

## 2018-01-23 NOTE — Telephone Encounter (Signed)
I have seen her in 2015- she is not longer an active patient but has seen Mellody DanceKeith last year. CD

## 2018-01-31 ENCOUNTER — Ambulatory Visit: Payer: Medicare Other | Admitting: Diagnostic Neuroimaging

## 2018-02-04 DIAGNOSIS — F39 Unspecified mood [affective] disorder: Secondary | ICD-10-CM | POA: Diagnosis not present

## 2018-02-04 DIAGNOSIS — H6990 Unspecified Eustachian tube disorder, unspecified ear: Secondary | ICD-10-CM | POA: Diagnosis not present

## 2018-02-04 DIAGNOSIS — R2689 Other abnormalities of gait and mobility: Secondary | ICD-10-CM | POA: Diagnosis not present

## 2018-02-18 ENCOUNTER — Ambulatory Visit: Payer: Medicare Other | Admitting: Neurology

## 2018-02-18 ENCOUNTER — Telehealth: Payer: Self-pay | Admitting: Neurology

## 2018-02-18 ENCOUNTER — Encounter: Payer: Self-pay | Admitting: Neurology

## 2018-02-18 VITALS — BP 118/66 | HR 76 | Ht 64.0 in | Wt 144.0 lb

## 2018-02-18 DIAGNOSIS — R270 Ataxia, unspecified: Secondary | ICD-10-CM | POA: Diagnosis not present

## 2018-02-18 DIAGNOSIS — G3281 Cerebellar ataxia in diseases classified elsewhere: Secondary | ICD-10-CM | POA: Diagnosis not present

## 2018-02-18 DIAGNOSIS — R251 Tremor, unspecified: Secondary | ICD-10-CM

## 2018-02-18 DIAGNOSIS — I6381 Other cerebral infarction due to occlusion or stenosis of small artery: Secondary | ICD-10-CM

## 2018-02-18 DIAGNOSIS — R42 Dizziness and giddiness: Secondary | ICD-10-CM

## 2018-02-18 DIAGNOSIS — Z8673 Personal history of transient ischemic attack (TIA), and cerebral infarction without residual deficits: Secondary | ICD-10-CM | POA: Insufficient documentation

## 2018-02-18 MED ORDER — ASPIRIN EC 325 MG PO TBEC
325.0000 mg | DELAYED_RELEASE_TABLET | Freq: Every day | ORAL | 0 refills | Status: DC
Start: 2018-02-18 — End: 2020-06-28

## 2018-02-18 NOTE — Patient Instructions (Addendum)
Labs today Check with Dr. Hal Hope regarding cholesterol panel, LDL goal < 7 MRI brain and MRI cervical spine If lacunar stroke is confirmed will order complete stroke evaluation   Essential Tremor A tremor is trembling or shaking that you cannot control. Most tremors affect the hands or arms. Tremors can also affect the head, vocal cords, face, and other parts of the body. Essential tremor is a tremor without a known cause. What are the causes? Essential tremor has no known cause. What increases the risk? You may be at greater risk of essential tremor if:  You have a family member with essential tremor.  You are age 76 or older.  You take certain medicines.  What are the signs or symptoms? The main sign of a tremor is uncontrolled and unintentional rhythmic shaking of a body part.  You may have difficulty eating with a spoon or fork.  You may have difficulty writing.  You may nod your head up and down or side to side.  You may have a quivering voice.  Your tremors:  May get worse over time.  May come and go.  May be more noticeable on one side of your body.  May get worse due to stress, fatigue, caffeine, and extreme heat or cold.  How is this diagnosed? Your health care provider can diagnose essential tremor based on your symptoms, medical history, and a physical examination. There is no single test to diagnose an essential tremor. However, your health care provider may perform a variety of tests to rule out other conditions. Tests may include:  Blood and urine tests.  Imaging studies of your brain, such as: ? CT scan. ? MRI.  A test that measures involuntary muscle movement (electromyogram).  How is this treated? Your tremors may go away without treatment. Mild tremors may not need treatment if they do not affect your day-to-day life. Severe tremors may need to be treated using one or a combination of the following options:  Medicines. This may include  medicine that is injected.  Lifestyle changes.  Physical therapy.  Follow these instructions at home:  Take medicines only as directed by your health care provider.  Limit alcohol intake to no more than 1 drink per day for nonpregnant women and 2 drinks per day for men. One drink equals 12 oz of beer, 5 oz of wine, or 1 oz of hard liquor.  Do not use any tobacco products, including cigarettes, chewing tobacco, or electronic cigarettes. If you need help quitting, ask your health care provider.  Take medicines only as directed by your health care provider.  Avoid extreme heat or cold.  Limit the amount of caffeine you consumeas directed by your health care provider.  Try to get eight hours of sleep each night.  Find ways to manage your stress, such as meditation or yoga.  Keep all follow-up visits as directed by your health care provider. This is important. This includes any physical therapy visits. Contact a health care provider if:  You experience any changes in the location or intensity of your tremors.  You start having a tremor after starting a new medicine.  You have tremor with other symptoms such as: ? Numbness. ? Tingling. ? Pain. ? Weakness.  Your tremor gets worse.  Your tremor interferes with your daily life. This information is not intended to replace advice given to you by your health care provider. Make sure you discuss any questions you have with your health care provider. Document Released:  12/17/2014 Document Revised: 05/03/2016 Document Reviewed: 05/24/2014 Elsevier Interactive Patient Education  2018 [employer].   Lacunar Stroke A stroke is the sudden death of brain tissue that occurs when an area of the brain does not get enough oxygen. A lacunar stroke (lacunar infarction) is caused by a blockage in one of the small arteries deep in the brain. Lacunar stroke is a medical emergency that must be treated right away.It is important to get help right  away as soon as you notice symptoms of stroke. What are the causes? A lacunar stroke occurs when small arteries deep in the brain become more narrow due to a buildup of fatty deposits in the arteries (atherosclerosis). When the arteries narrow, less blood flows to certain areas of the brain. Without enough blood and oxygen, these parts of the brain can die or become permanently damaged. What increases the risk? You are more likely to develop this condition if:  You have high blood pressure (hypertension).  You have high cholesterol (hyperlipidemia).  You smoke.  You have diabetes.  You are obese.  You have an unhealthy diet. This includes foods that are high in saturated fat, trans fat, and salt (sodium).  You have a heart rhythm disorder (atrial fibrillation).  You have heart disease.  You have artery disease, such as carotid artery disease or peripheral artery disease.  You have sickle cell disease.  You are age 76 or older.  You have a personal or family history of stroke.  You are African-American.  You are a woman who: ? Is pregnant. ? Has a history of diabetes during pregnancy (gestational diabetes). ? Has a history of preeclampsia or eclampsia. ? Has had hormone therapy after menopause. ? Uses oral birth control (contraception), especially while smoking.  What are the signs or symptoms? Symptoms of this condition usually develop suddenly. They may include:  Weakness or numbness in your face, arm, or leg, especially on one side of your body.  Trouble walking or difficulty moving your arms or legs.  Loss of balance or coordination.  Confusion.  Slurred speech (dysarthria).  Trouble speaking, understanding speech, or both (aphasia).  Vision problems in one or both eyes.  Dizziness.  Nausea and vomiting.  Severe headache with no known cause.  How is this diagnosed? This condition may be diagnosed based on:  Your symptoms and medical history.  A  physical exam.  Blood tests.  A CT scan of the brain.  MRI.  Ultrasound of an artery. This may help find blood flow problems or blockages.  Angiogram. During this test, dye is injected into your blood and then an X-ray is done to look for blockages. The dye helps blood flow and blockages show up clearly on X-rays.  Electroencephalogram (EEG). This test checks electrical activity in the brain.  How is this treated? This condition must be treated within 4.5 hours of the start of the stroke. It is treated with IV medicine that dissolves the blood clot (tissue plasminogen activator, TPA) that is causing the blockage. The goal is to restore blood flow to the brain as soon as possible. Your health care provider may prescribe blood thinners (antiplatelets or anticoagulants) to lower your risk of another stroke. Follow these instructions at home: Medicines  Take over-the-counter and other prescription medicines only as told by your health care provider. This includes diabetes or cholesterol medicine.  If you were told to take a medicine to thin your blood, such as aspirin, take it exactly as told by  your health care provider. ? Taking too much blood-thinning medicine can cause bleeding. ? If you do not take enough blood-thinning medicine, you will not have the protection that you need against another stroke and other problems. Eating and drinking  Follow instructions from your health care provider about eating and drinking restrictions.  Eat a healthy diet. This includes plenty of fruits and vegetables, lean meats, whole grains, and low-fat dairy products.  Avoid foods high in saturated fat, trans fat, or sodium.  Limit alcohol intake to no more than 1 drink a day for nonpregnant women and 2 drinks a day for men. One drink equals 12 oz of beer, 5 oz of wine, or 1 oz of hard liquor. Safety  If you need help walking, use a cane or walker as told by your health care provider.  Take steps  to lower the risk of falls in your home. This may include: ? Using safety equipment, such as raised toilets and a seat in the shower. ? Removing clutter and tripping hazards from walkways, such as cords or rugs. ? Installing grab bars in the bedroom and bathroom. Activity  Exercise regularly, as told by your health care provider.  Take part in rehabilitation programs as told by your health care provider. This may include physical therapy, occupational therapy, or speech therapy. General instructions  Do not use any tobacco products, such as cigarettes, chewing tobacco, and e-cigarettes. If you need help quitting, ask your health care provider.  Keep all follow-up visits as told by your health care provider. This is important. Get help right away if:  You have any symptoms of stroke. "BE FAST" is an easy way to remember the main warning signs of stroke: ? B - Balance. Signs are dizziness, sudden trouble walking, or loss of balance. ? E - Eyes. Signs are trouble seeing or a sudden change in vision. ? F - Face. Signs are sudden weakness or numbness of the face, or the face or eyelid drooping on one side. ? A - Arms. Signs are weakness or numbness in an arm. This happens suddenly and usually on one side of the body. ? S - Speech. Signs are sudden trouble speaking, slurred speech, or trouble understanding what people say. ? T - Time. Time to call emergency services. Write down what time symptoms started.  You have other signs of stroke, such as: ? A sudden, severe headache with no known cause. ? Nausea or vomiting. ? Seizure.  You have a severe fall or injury. These symptoms may represent a serious problem that is an emergency. Do not wait to see if the symptoms will go away. Get medical help right away. Call your local emergency services (911 in the [place] Do not drive yourself to the hospital. Summary  A lacunar stroke (lacunar infarction) is a blockage of one of the small arteries deep  in the brain. When one of these arteries is blocked, parts of the brain do not get enough oxygen and may die.  This condition is a medical emergency that must be treated right away. Treatments must be done within 4.5 hours of the start of the stroke.  Controlling your risk factors for stroke is the best way to avoid another lacunar stroke.  Get help right away if you have any symptoms of stroke. "BE FAST" is an easy way to remember the main warning signs of stroke. This information is not intended to replace advice given to you by your health care provider. Make  sure you discuss any questions you have with your health care provider. Document Released: 04/12/2017 Document Revised: 04/12/2017 Document Reviewed: 04/12/2017 Elsevier Interactive Patient Education  2018 [employer]

## 2018-02-18 NOTE — Telephone Encounter (Signed)
02/18/18 pending faxed clinical notes EE

## 2018-02-18 NOTE — Progress Notes (Signed)
[facility]    Provider:  Dr Lucia GaskinsAhern Referring Provider: Dois Davenportichter, Karen L, MD Primary Care Physician:  Dois Davenportichter, Karen L, MD  CC:  "Equilibrium is off"  HPI:  Linda Macdonald is a 77 y.o. female here as a referral from Dr. Hal Hopeichter for a new consult. She feels her"equilibrium is off". She feels when she walks she has difficulty. She stubbed her foot on the carpet once. Started last summer, no inciting event or head trauma. No falls. Happens every day. Several times she has noticed a tremor. Usually when holding something. She just keeps saying she is "off", she can;t explain it it is just that she sometimes takes a wrong step or an extra step to the right. No resting tremor. She wakes sometimes and can;t go back to sleep. Her first cousin has MS. No neuropathic disorders or neuromuscular disorders, no parkinson's disease in the family. Father had dementia in his mid 1370s. 3x, not lately, her visual picture has "flipped up" for 30 seconds, no headache, no numbness, tingling, weakness. No symptoms in the feet, no numbness or tingling in the feet. No loss of smell or taste. She has chronic double vision treated with lenses. Grandmother had tremors. No other focal neurologic deficits, associated symptoms, inciting events or modifiable factors.  Reviewed notes, labs and imaging from outside physicians, which showed:  Personally reviewed Ct head and agree with the following:  TSH normal, b12 normal in the past  IMPRESSION: 1. No acute intracranial abnormality. 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  Reviewed notes.  Patient was seen in May 2015 for tingling dysesthesias.  Constant tingling sensation in her feet and is slowly ascending.  Unclear what initially started on.  Onset was 2 years prior she first noticed it.  It started affecting her sleep and more difficult to fall asleep.  Also started noticing trouble with balance and fine motor skills especially  during the dance class.  She developed some clamminess and some off-balance feeling.  She continued to be physically active.  Thyroid tests and vitamin D deficiencies were ruled out.  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  She was placed on a dental device for bruxism and snoring mild apnea.  She was tried on Requip.  Reviewed EMG nerve conduction study results and data, right upper extremity and both lower extremities showed no evidence of a peripheral neuropathy or lumbosacral radiculopathy.   Review of Systems: Patient complains of symptoms per HPI as well as the following symptoms: insomnia. Pertinent negatives and positives per HPI. All others negative.   Social History   Socioeconomic History  . Marital status: Widowed    Spouse name: Not on file  . Number of children: 3  . Years of education: College  . Highest education level: Not on file  Social Needs  . Financial resource strain: Not on file  . Food insecurity - worry: Not on file  . Food insecurity - inability: Not on file  . Transportation needs - medical: Not on file  . Transportation needs - non-medical: Not on file  Occupational History  . Not on file  Tobacco Use  . Smoking status: Never Smoker  . Smokeless tobacco: Never Used  Substance and Sexual Activity  . Alcohol use: Yes    Comment: socially  . Drug use: No  . Sexual activity: Not on file  Other Topics Concern  . Not  on file  Social History Narrative   Patient is a widow and lives alone with her dog.   Patient has three children.   Patient is retired.   Patient has a college education.   Patient is right-handed.   Patient is not drinking any caffeine.             Family History  Problem Relation Age of Onset  . Colon cancer Maternal Grandmother     Past Medical History:  Diagnosis Date  . Arthritis   . Chronic kidney disease   . Depression    . Leukopenia 08/12/2012  . Thyroid disease     Past Surgical History:  Procedure Laterality Date  . EXTRACORPOREAL SHOCK WAVE LITHOTRIPSY Left 04/25/2017   Procedure: LEFT EXTRACORPOREAL SHOCK WAVE LITHOTRIPSY (ESWL);  Surgeon: Heloise Purpura, MD;  Location: [facility];  Service: Urology;  Laterality: Left;  . KNEE ARTHROSCOPY Left 2oyrs ago   L knee x2/ R knee x1  . LITHOTRIPSY  2012   difficulty voiding after procedure, d/c home with foley per pt  . TUBAL LIGATION    . WISDOM TOOTH EXTRACTION     teenager    Current Outpatient Medications  Medication Sig Dispense Refill  . BuPROPion HCl (WELLBUTRIN XL PO) Take 450 mg by mouth daily.    . calcium citrate-vitamin D (CITRACAL+D) 315-200 MG-UNIT per tablet Take 1 tablet by mouth 2 (two) times daily. Pt not taking    . Cholecalciferol (VITAMIN D-3) 1000 UNITS CAPS Take 1 capsule by mouth daily.    . Turmeric 500 MG TABS Take by mouth.    Marland Kitchen aspirin EC 325 MG tablet Take 1 tablet (325 mg total) by mouth daily. 30 tablet 0  . levothyroxine (LEVOTHROID) 25 MCG tablet Take 1 tablet (25 mcg total) by mouth daily. 90 tablet 3   No current facility-administered medications for this visit.     Allergies as of 02/18/2018 - Review Complete 02/18/2018  Allergen Reaction Noted  . Erythromycin  11/13/2007  . Penicillins  11/13/2007    Vitals: BP 118/66   Pulse 76   Ht 5\' 4"  (1.626 m)   Wt 144 lb (65.3 kg)   BMI 24.72 kg/m  Last Weight:  Wt Readings from Last 1 Encounters:  02/18/18 144 lb (65.3 kg)   Last Height:   Ht Readings from Last 1 Encounters:  02/18/18 5\' 4"  (1.626 m)    Physical exam: Exam: Gen: NAD, conversant, well nourised, thin, well groomed                     CV: RRR, no MRG. No Carotid Bruits. No peripheral edema, warm, nontender Eyes: Conjunctivae clear without exudates or hemorrhage  Neuro: Detailed Neurologic Exam  Speech:    Speech is normal; fluent and spontaneous with normal comprehension.  Cognition:     The patient is oriented to person, place, and time;     recent and remote memory intact;     language fluent;     normal attention, concentration,     fund of knowledge Cranial Nerves:    The pupils are equal, round, and reactive to light. The fundi are normal and spontaneous venous pulsations are present. Visual fields are full to finger confrontation. Extraocular movements are intact. Trigeminal sensation is intact and the muscles of mastication are normal. The face is symmetric. The palate elevates in the midline. Hearing intact. Voice is normal. Shoulder shrug is normal. The tongue has normal motion without fasciculations.  Coordination:    Normal finger to nose and heel to shin. Normal rapid alternating movements.   Gait:    Heel-toe and tandem gait are normal.   Motor Observation:    No asymmetry, no atrophy, and no involuntary movements noted. Tone:    Normal muscle tone.    Posture:    Posture is normal. normal erect    Strength:    Strength is V/V in the upper and lower limbs.      Sensation: intact to LT     Reflex Exam:  DTR's:    Deep tendon reflexes in the upper and lower extremities are brisk bilaterally.   Toes:    The toes are downgoing bilaterally.   Clonus:    Clonus is absent.      Assessment/Plan:  77 year old with multiple neurologic complaints including equilibrium difficulty, ataxia, dizziness and vertiginous symptoms. Lacunar infarct seen on CT head.  - MRI brain w/wo contrast: Given symptoms above and previous stroke seen on CAT scan need MRI of the brain to evaluate for stroke, masses, other lesions such as schwannomas which could cause her symptoms including ataxia and vertigo or other intracranial etiologies. - MRI of the cervical spine without contrast given ataxia to evaluate for cervical spondylosis and myelopathy/cervical stenosis given brisk reflexes, ataxia. - Discussed the lacunar infarct with patient, she was unaware.  Will further  evaluate with MRI of the brain and if necessary will complete stroke workup.  I had a long d/w patient about her remote lacunar stroke, risk for recurrent stroke/TIAs, personally independently reviewed imaging studies and stroke evaluation results and answered questions. Start ASA 325mg  for secondary stroke prevention and maintain strict control of hypertension with blood pressure goal below 130/90, diabetes with hemoglobin A1c goal below 6.5% and lipids with LDL cholesterol goal below 70 mg/dL. I also advised the patient to eat a healthy diet with plenty of whole grains, cereals, fruits and vegetables, exercise regularly and maintain ideal body weight .   F/u 3 months with stroke NP Jessica. Asked patient to check with Dr. Hal Hope regarding LDL and lipid management. She has already eaten this morning.  Orders Placed This Encounter  Procedures  . MR BRAIN W WO CONTRAST  . MR CERVICAL SPINE WO CONTRAST  . Comprehensive metabolic panel  . Hemoglobin A1c  . TSH     Cc: Dr. Obie Dredge, MD  [facility] [address] [place], [place] [address]  Phone [phone] Fax [phone]

## 2018-02-19 ENCOUNTER — Telehealth: Payer: Self-pay

## 2018-02-19 LAB — COMPREHENSIVE METABOLIC PANEL
ALBUMIN: 4.4 g/dL (ref 3.5–4.8)
ALK PHOS: 62 IU/L (ref 39–117)
ALT: 17 IU/L (ref 0–32)
AST: 20 IU/L (ref 0–40)
Albumin/Globulin Ratio: 1.8 (ref 1.2–2.2)
BILIRUBIN TOTAL: 0.4 mg/dL (ref 0.0–1.2)
BUN / CREAT RATIO: 15 (ref 12–28)
BUN: 14 mg/dL (ref 8–27)
CHLORIDE: 101 mmol/L (ref 96–106)
CO2: 24 mmol/L (ref 20–29)
Calcium: 9.5 mg/dL (ref 8.7–10.3)
Creatinine, Ser: 0.92 mg/dL (ref 0.57–1.00)
GFR calc Af Amer: 70 mL/min/{1.73_m2} (ref >59)
GFR calc non Af Amer: 61 mL/min/{1.73_m2} (ref >59)
GLUCOSE: 85 mg/dL (ref 65–99)
Globulin, Total: 2.4 g/dL (ref 1.5–4.5)
Potassium: 4.6 mmol/L (ref 3.5–5.2)
Sodium: 138 mmol/L (ref 134–144)
Total Protein: 6.8 g/dL (ref 6.0–8.5)

## 2018-02-19 LAB — HEMOGLOBIN A1C
ESTIMATED AVERAGE GLUCOSE: 100 mg/dL
HEMOGLOBIN A1C: 5.1 % (ref 4.8–5.6)

## 2018-02-19 LAB — TSH: TSH: 1.74 u[IU]/mL (ref 0.450–4.500)

## 2018-02-19 NOTE — Telephone Encounter (Signed)
-----   Message from Anson FretAntonia B Ahern, MD sent at 02/19/2018 10:18 AM EDT ----- Labs normal

## 2018-02-19 NOTE — Telephone Encounter (Signed)
[employer] Berkley Harveyauth: [id number] (exp. 02/18/18 to 03/19/18) order sent to GI. They will reach out to the patient to schedule.

## 2018-02-19 NOTE — Telephone Encounter (Signed)
I called pt, advised her that Dr. Lucia GaskinsAhern reviewed her labs and found they were normal. Pt is asking to schedule her MRIs. I advised her that our MRI dept is working on this and they will reach out to her. Pt verbalized understanding of results.

## 2018-02-25 DIAGNOSIS — H6983 Other specified disorders of Eustachian tube, bilateral: Secondary | ICD-10-CM | POA: Diagnosis not present

## 2018-02-25 DIAGNOSIS — J309 Allergic rhinitis, unspecified: Secondary | ICD-10-CM | POA: Diagnosis not present

## 2018-02-25 DIAGNOSIS — R2689 Other abnormalities of gait and mobility: Secondary | ICD-10-CM | POA: Diagnosis not present

## 2018-02-25 DIAGNOSIS — E039 Hypothyroidism, unspecified: Secondary | ICD-10-CM | POA: Diagnosis not present

## 2018-02-28 ENCOUNTER — Ambulatory Visit
Admission: RE | Admit: 2018-02-28 | Discharge: 2018-02-28 | Disposition: A | Discharge status: Home / Self Care | Payer: Medicare Other | Source: Ambulatory Visit | Attending: Neurology | Admitting: Neurology

## 2018-02-28 DIAGNOSIS — R251 Tremor, unspecified: Secondary | ICD-10-CM

## 2018-02-28 DIAGNOSIS — R42 Dizziness and giddiness: Secondary | ICD-10-CM

## 2018-02-28 DIAGNOSIS — G3281 Cerebellar ataxia in diseases classified elsewhere: Secondary | ICD-10-CM

## 2018-02-28 DIAGNOSIS — R270 Ataxia, unspecified: Secondary | ICD-10-CM

## 2018-02-28 DIAGNOSIS — I6381 Other cerebral infarction due to occlusion or stenosis of small artery: Secondary | ICD-10-CM

## 2018-02-28 MED ORDER — GADOBENATE DIMEGLUMINE 529 MG/ML IV SOLN
13.0000 mL | Freq: Once | INTRAVENOUS | Status: AC | PRN
  Administered 2018-02-28: 13 mL via INTRAVENOUS

## 2018-03-03 ENCOUNTER — Telehealth: Payer: Self-pay | Admitting: Neurology

## 2018-03-03 NOTE — Telephone Encounter (Signed)
Spoke with patient and kindly informed her that Dr. Lucia GaskinsAhern will review the results and then she will receive a call back, likely within 1-2 days. She is aware that the scan is read by radiology then Dr. Lucia GaskinsAhern and can take a few days. The patient was very appreciative for the update.

## 2018-03-03 NOTE — Telephone Encounter (Signed)
Patient had an MRI last Friday and is very anxious to get results.

## 2018-03-04 NOTE — Telephone Encounter (Signed)
See result note - thanks

## 2018-03-04 NOTE — Telephone Encounter (Signed)
Spoke with patient. She is aware that her MRI brain is unremarkable, normal for age. The possible stroke seen on Cat Scan is likely a benign space around a blood vessel, Dr. Lucia GaskinsAhern reviewed it with neuroradiology. Pt also aware that regarding MRI cervical spine, she has some mild arthritic changes in the neck which is not unusual at the age of 77. But the spinal cord looks good, no cause for her symptoms seen. 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 However she would like to know what is next. She is aware that the next f/u is in 3 months with NP but in the meantime what does she do about the tremor and equilibrium issues. RN informed pt that she will ask Dr. Lucia GaskinsAhern and return her call. She verbalized appreciation.   Notes recorded by Anson FretAhern, Antonia B, MD on 03/04/2018 at 1:03 PM EDT MRI of the brain unremarkable, normal for age. The possible stroke seen on Cat Scan is likely an benign space around a blood vessel, I reviewed it with neuroradiology. Thanks  Notes recorded by Anson FretAhern, Antonia B, MD on 03/04/2018 at 1:13 PM EDT She has some mild arthritic changes in the neck which is not unusual at the age of 77. But the spinal cord looks good, no cause for her symptoms seen.

## 2018-03-05 NOTE — Telephone Encounter (Addendum)
Spoke with pt and discussed that Dr. Lucia GaskinsAhern said pt can start with PT and then follow up with Eber Jonesarolyn the NP. Pt's questions were answered and she agreed to PT referral. Pt r[facility] on [facility] @ 08:15 arrival time 07:45. Pt had wanted to be seen earlier if possible d/t further questions about her tremor. Pt encouraged to try PT first. Pt asked if she should still take Aspirin 325 mg daily. Per Dr. Lucia GaskinsAhern, yes continue. Pt made aware. Pt would like to think about an ENT referral and will ask her PCP about it. Pt asked if there was anything to do about the tremor. RN discussed with Dr. Lucia GaskinsAhern and Dr. Lucia GaskinsAhern offered pt Propranolol 10 mg PO BID. Patient aware this is a beta blocker and was encouraged to take BP and HR if symptomatic (ex. SOB, light headed, chest pain). Pt wanted to think about the propranolol and she will call back.  Pt also asked about a letter with plan of care as she appreciated the AVS she received at her last office visit.

## 2018-03-05 NOTE — Telephone Encounter (Signed)
Would start with physical therapy and follow up afterwards. Since this is no stroke can see if appointment can be changed to [name]'s Schedule. No neurologic cause for her equilibrium, may consider ENT evaluation if not already done. But no suggestion of neurologic disease to cause symptoms.

## 2018-03-05 NOTE — Telephone Encounter (Signed)
Discussed with Dr. Lucia GaskinsAhern & pt that pt would like to be seen sooner. She has questions to be discussed. [facility] time 09:45. Pt was very appreciative. She is aware that she can sign a release form to get her MRI report.

## 2018-03-06 NOTE — Progress Notes (Signed)
[facility]  PATIENT: Linda Macdonald DOB: 01/20/1941   REASON FOR VISIT: Follow-up tremor, balance issues HISTORY FROM: Patient    HISTORY OF PRESENT ILLNESS: UPDATE 4/1/2019CM [name], 77 year old female returns for follow-up.  She was initially evaluated for essential tremor and balance issues.  She denies any falls.  In terms of her tremor it usually occurs when she is trying to write or eat.  Dr. Lucia GaskinsAhern had ordered propanolol which she did not start.  In addition she was asked to try some physical therapy for her balance issues but she did not go to therapy.  She is currently seeing a personal trainer 2 times a week and does yoga in addition to that.  She remains on aspirin 325 daily for secondary stroke prevention blood pressure in the office today 117/67.  MRI of the brain done on 02/28/2018 1. Brain volume is normal for age.  There are a few punctate T2/FLAIR hyperintense foci in the hemispheres consistent with age-appropriate chronic micro vascular ischemic change. 2.    Possible small chronic lacunar infarction adjacent to the basal ganglia on the right.  This could also represent an expanded Virchow-Robin space. 3.    There are no acute findings and there is a normal enhancement pattern.  MRI of the cervical spine on 3/22/2019without contrast shows multilevel degenerative changes as detailed above.  Most significant findings are: 1.   At C4-C5, there is borderline spinal stenosis due to disc protrusion and minimal uncovertebral spurring.  There is no nerve root compression. 2.   At C5-C6, there are degenerative changes causing mild left foraminal narrowing but no nerve root compression 3.   At C6-C7, there is mild spinal stenosis due to degenerative changes and also moderate left and mild right foraminal narrowing.  There does not appear to be nerve root compression. 4.    The spinal cord has normal signal. Discussed these findings with the patient.  She  returns for reevaluation    02/18/18 AACaroline G Macdonald is a 77 y.o. female here as a referral from Dr. Hal Hopeichter for a new consult. She feels her"equilibrium is off". She feels when she walks she has difficulty. She stubbed her foot on the carpet once. Started last summer, no inciting event or head trauma. No falls. Happens every day. Several times she has noticed a tremor. Usually when holding something. She just keeps saying she is "off", she can;t explain it it is just that she sometimes takes a wrong step or an extra step to the right. No resting tremor. She wakes sometimes and can;t go back to sleep. Her first cousin has MS. No neuropathic disorders or neuromuscular disorders, no parkinson's disease in the family. Father had dementia in his mid 5970s. 3x, not lately, her visual picture has "flipped up" for 30 seconds, no headache, no numbness, tingling, weakness. No symptoms in the feet, no numbness or tingling in the feet. No loss of smell or taste. She has chronic double vision treated with lenses. Grandmother had tremors. No other focal neurologic deficits, associated symptoms, inciting events or modifiable factors.    REVIEW OF SYSTEMS: Full 14 system review of systems performed and notable only for those listed, all others are neg:  Constitutional: neg  Cardiovascular: neg Ear/Nose/Throat: Trouble swallowing big pills  Skin: neg Eyes: neg Respiratory: neg Gastroitestinal: neg  Hematology/Lymphatic: neg  Endocrine: neg Musculoskeletal:neg Allergy/Immunology: neg Neurological: Tremors Psychiatric: Depression taking Wellbutrin Sleep : neg   ALLERGIES: Allergies  Allergen Reactions  .  Erythromycin   . Penicillins     HOME MEDICATIONS: Outpatient Medications Prior to Visit  Medication Sig Dispense Refill  . aspirin EC 325 MG tablet Take 1 tablet (325 mg total) by mouth daily. (Patient not taking: Reported on 03/10/2018) 30 tablet 0  . BuPROPion HCl (WELLBUTRIN XL PO) Take 450 mg  by mouth daily.    . calcium citrate-vitamin D (CITRACAL+D) 315-200 MG-UNIT per tablet Take 1 tablet by mouth 2 (two) times daily. Pt not taking    . Cholecalciferol (VITAMIN D-3) 1000 UNITS CAPS Take 1 capsule by mouth daily.    Marland Kitchen levothyroxine (LEVOTHROID) 25 MCG tablet Take 1 tablet (25 mcg total) by mouth daily. 90 tablet 3  . Turmeric 500 MG TABS Take by mouth.     No facility-administered medications prior to visit.     PAST MEDICAL HISTORY: Past Medical History:  Diagnosis Date  . Arthritis   . Chronic kidney disease   . Depression   . Leukopenia 08/12/2012  . Thyroid disease     PAST SURGICAL HISTORY: Past Surgical History:  Procedure Laterality Date  . EXTRACORPOREAL SHOCK WAVE LITHOTRIPSY Left 04/25/2017   Procedure: LEFT EXTRACORPOREAL SHOCK WAVE LITHOTRIPSY (ESWL);  Surgeon: Heloise Purpura, MD;  Location: [facility];  Service: Urology;  Laterality: Left;  . KNEE ARTHROSCOPY Left 2oyrs ago   L knee x2/ R knee x1  . LITHOTRIPSY  2012   difficulty voiding after procedure, d/c home with foley per pt  . TUBAL LIGATION    . WISDOM TOOTH EXTRACTION     teenager    FAMILY HISTORY: Family History  Problem Relation Age of Onset  . Colon cancer Maternal Grandmother     SOCIAL HISTORY: Social History   Socioeconomic History  . Marital status: Widowed    Spouse name: Not on file  . Number of children: 3  . Years of education: College  . Highest education level: Not on file  Occupational History  . Not on file  Social Needs  . Financial resource strain: Not on file  . Food insecurity:    Worry: Not on file    Inability: Not on file  . Transportation needs:    Medical: Not on file    Non-medical: Not on file  Tobacco Use  . Smoking status: Never Smoker  . Smokeless tobacco: Never Used  Substance and Sexual Activity  . Alcohol use: Yes    Comment: socially  . Drug use: No  . Sexual activity: Not on file  Lifestyle  . Physical activity:    Days per week: Not on  file    Minutes per session: Not on file  . Stress: Not on file  Relationships  . Social connections:    Talks on phone: Not on file    Gets together: Not on file    Attends religious service: Not on file    Active member of club or organization: Not on file    Attends meetings of clubs or organizations: Not on file    Relationship status: Not on file  . Intimate partner violence:    Fear of current or ex partner: Not on file    Emotionally abused: Not on file    Physically abused: Not on file    Forced sexual activity: Not on file  Other Topics Concern  . Not on file  Social History Narrative   Patient is a widow and lives alone with her dog.   Patient has three children.  Patient is retired.   Patient has a college education.   Patient is right-handed.   Patient is not drinking any caffeine.              PHYSICAL EXAM  Vitals:   03/10/18 0948  BP: 117/67  Pulse: 70  Weight: 143 lb 6.4 oz (65 kg)  Height: 5\' 4"  (1.626 m)   Body mass index is 24.61 kg/m.  Generalized: Well developed, in no acute distress , well-groomed Head: normocephalic and atraumatic,. Oropharynx benign  Neck: Supple, no carotid bruits  Cardiac: Regular rate rhythm, no murmur  Musculoskeletal: No deformity   Neurological examination   Mentation: Alert oriented to time, place, history taking. Attention span and concentration appropriate. Recent and remote memory intact.  Follows all commands speech and language fluent.   Cranial nerve II-XII: .Pupils were equal round reactive to light extraocular movements were full, visual field were full on confrontational test. Facial sensation and strength were normal. hearing was intact to finger rubbing bilaterally. Uvula tongue midline. head turning and shoulder shrug were normal and symmetric.Tongue protrusion into cheek strength was normal. Motor: normal bulk and tone, full strength in the BUE, BLE, fine finger movements normal, no pronator drift. No  focal weakness Sensory: normal and symmetric to light touch, pinprick, and  Vibration, in the upper and lower extremities Coordination: finger-nose-finger, heel-to-shin bilaterally, no dysmetria, no tremor Reflexes: Brisk in the upper and lower extremities, plantar responses were flexor bilaterally. Gait and Station: Rising up from seated position without assistance, normal stance,  moderate stride, good arm swing, smooth turning, able to perform tiptoe, and heel walking without difficulty. Tandem gait is mildly unsteady  DIAGNOSTIC DATA (LABS, IMAGING, TESTING) - I reviewed patient records, labs, notes, testing and imaging myself where available.  Lab Results  Component Value Date   WBC 3.8 (A) 10/19/2014   HGB 14.2 10/19/2014   HCT 43.3 10/19/2014   MCV 98.9 (A) 10/19/2014   PLT 246 04/14/2010      Component Value Date/Time   NA 138 02/18/2018 0858   K 4.6 02/18/2018 0858   CL 101 02/18/2018 0858   CO2 24 02/18/2018 0858   GLUCOSE 85 02/18/2018 0858   GLUCOSE 92 10/19/2014 1056   BUN 14 02/18/2018 0858   CREATININE 0.92 02/18/2018 0858   CREATININE 0.74 10/19/2014 1056   CALCIUM 9.5 02/18/2018 0858   PROT 6.8 02/18/2018 0858   ALBUMIN 4.4 02/18/2018 0858   AST 20 02/18/2018 0858   ALT 17 02/18/2018 0858   ALKPHOS 62 02/18/2018 0858   BILITOT 0.4 02/18/2018 0858   GFRNONAA 61 02/18/2018 0858   GFRAA 70 02/18/2018 0858   Lab Results  Component Value Date   CHOL 164 08/29/2009   HDL 35.00 (L) 08/29/2009   LDLDIRECT 93.7 08/29/2009   TRIG 251.0 (H) 08/29/2009   CHOLHDL 5 08/29/2009   Lab Results  Component Value Date   HGBA1C 5.1 02/18/2018   Lab Results  Component Value Date   VITAMINB12 569 10/19/2014   Lab Results  Component Value Date   TSH 1.740 02/18/2018      ASSESSMENT AND PLAN  77 year old with multiple neurologic complaints including equilibrium difficulty, ataxia, dizziness and vertiginous symptoms. Lacunar infarct seen on CT head.  MRI of the  brain and  cervical spine see above results.  Findings were reviewed with patient.  Writing sample does not indicate essential tremor nor does spiral drawing.  -   nt about her remote lacunar stroke, risk  for recurrent stroke/TIAs, personally independently reviewed imaging studies and stroke evaluation results and answered questions. Start ASA 325mg  for secondary stroke prevention and maintain strict control of hypertension with blood pressure goal below 130/90, diabetes with hemoglobin A1c goal below 6.5% and lipids with LDL cholesterol goal below 70 mg/dL. I also advised the patient to eat a healthy diet with plenty of whole grains, cereals, fruits and vegetables, exercise regularly and maintain ideal body weight .  PLAN: Continue asa as currently taking for secondary stroke prevention  Continue personal trainer and moderate exercise, demonstrated a exercise for balance that patient can do several times a day BP in good control at 117/67 goal below 130 systolic LDL cholesterol goal below 70 follow-up at primary care Continue to eat a healthy diet low-fat Continue exercise program Weighted utensils or wrist weights for tremor, I would not advise use of medications at this time F/U yearly I spent 25 minutes in total face to face time with the patient more than 50% of which was spent counseling and coordination of care, reviewing test results reviewing medications and discussing and reviewing the diagnosis of essential tremor, ataxia and dizziness.  Reviewed MRI of the brain and cervical spine with patient and answered additional questions Nilda Riggs, [facility], APRN  [facility] [address], [address] [place], [place] [address] [phone]

## 2018-03-07 ENCOUNTER — Other Ambulatory Visit: Payer: Self-pay | Admitting: Neurology

## 2018-03-07 DIAGNOSIS — R42 Dizziness and giddiness: Secondary | ICD-10-CM

## 2018-03-07 DIAGNOSIS — R2689 Other abnormalities of gait and mobility: Secondary | ICD-10-CM

## 2018-03-07 NOTE — Telephone Encounter (Signed)
I have order the physical therapy.  Linda JonesCarolyn please come and discuss patient with me before her appointment with you. I believe the thalamic lesions is an expanded virchow-robbin space and not a lacunar infarct however can't be entirely sure so she should still stay on aspirin and closely manage all her vascular risk factors.  thanks

## 2018-03-10 ENCOUNTER — Ambulatory Visit: Payer: Medicare Other | Admitting: Nurse Practitioner

## 2018-03-10 ENCOUNTER — Telehealth: Payer: Self-pay | Admitting: Nurse Practitioner

## 2018-03-10 ENCOUNTER — Encounter: Payer: Self-pay | Admitting: Nurse Practitioner

## 2018-03-10 VITALS — BP 117/67 | HR 70 | Ht 64.0 in | Wt 143.4 lb

## 2018-03-10 DIAGNOSIS — G250 Essential tremor: Secondary | ICD-10-CM | POA: Insufficient documentation

## 2018-03-10 DIAGNOSIS — R42 Dizziness and giddiness: Secondary | ICD-10-CM | POA: Diagnosis not present

## 2018-03-10 DIAGNOSIS — I6381 Other cerebral infarction due to occlusion or stenosis of small artery: Secondary | ICD-10-CM

## 2018-03-10 NOTE — Telephone Encounter (Signed)
Referral for PT has already been started. Please advise as to what she should do about her swallowing?

## 2018-03-10 NOTE — Patient Instructions (Signed)
Continue asa as currently taking Continue personal trainer and moderate exercise BP in good control at 117/67 Continue exercise program Weighted utensils or wrist weights for tremor F/U yearly

## 2018-03-10 NOTE — Telephone Encounter (Signed)
Pt has asked for a call back re: her visit with NP Eber Jonesarolyn. Pt states that there was no real resolution to her mentioning her difficulty in swallowing big pills.  Also pt states she would very much like to move forward in beginning Physical Therapy.  Please call

## 2018-03-11 NOTE — Telephone Encounter (Signed)
Per Dr. Lucia GaskinsAhern with your pills you can use either applesauce or pudding.

## 2018-03-11 NOTE — Telephone Encounter (Signed)
I spoke to pt and and relayed that she may take with pudding or applesauce and see if this helps.  She is aware of this, but is asking why is she having a problem when she did not issue before.  This she has noticed 2 months ago.  She has appt with her pcp in about 10- 14 days and will ask her.  She was ok to have PT and I relayed that the order was placed for PT, [facility] on 03-07-18 and was sent thru Epic on 03/10/2018.  She should be getting a call to schedule.  She verbalized understanding.

## 2018-03-13 ENCOUNTER — Ambulatory Visit: Payer: Medicare Other | Admitting: Neurology

## 2018-03-25 ENCOUNTER — Emergency Department (HOSPITAL_COMMUNITY): Payer: Medicare Other

## 2018-03-25 ENCOUNTER — Other Ambulatory Visit: Payer: Self-pay

## 2018-03-25 ENCOUNTER — Emergency Department (HOSPITAL_COMMUNITY)
Admission: EM | Admit: 2018-03-25 | Discharge: 2018-03-25 | Disposition: A | Discharge status: Home / Self Care | Payer: Medicare Other | Attending: Emergency Medicine | Admitting: Emergency Medicine

## 2018-03-25 ENCOUNTER — Encounter (HOSPITAL_COMMUNITY): Payer: Self-pay | Admitting: Emergency Medicine

## 2018-03-25 DIAGNOSIS — R42 Dizziness and giddiness: Secondary | ICD-10-CM | POA: Diagnosis not present

## 2018-03-25 DIAGNOSIS — R270 Ataxia, unspecified: Secondary | ICD-10-CM | POA: Diagnosis not present

## 2018-03-25 DIAGNOSIS — Z79899 Other long term (current) drug therapy: Secondary | ICD-10-CM | POA: Diagnosis not present

## 2018-03-25 DIAGNOSIS — N189 Chronic kidney disease, unspecified: Secondary | ICD-10-CM | POA: Diagnosis not present

## 2018-03-25 DIAGNOSIS — E039 Hypothyroidism, unspecified: Secondary | ICD-10-CM | POA: Insufficient documentation

## 2018-03-25 DIAGNOSIS — Z7982 Long term (current) use of aspirin: Secondary | ICD-10-CM | POA: Diagnosis not present

## 2018-03-25 LAB — URINALYSIS, ROUTINE W REFLEX MICROSCOPIC
Bilirubin Urine: NEGATIVE
GLUCOSE, UA: NEGATIVE mg/dL
Ketones, ur: 5 mg/dL — AB
Nitrite: NEGATIVE
PH: 5 (ref 5.0–8.0)
Protein, ur: NEGATIVE mg/dL
Specific Gravity, Urine: 1.012 (ref 1.005–1.030)

## 2018-03-25 LAB — CBC WITH DIFFERENTIAL/PLATELET
Basophils Absolute: 0 10*3/uL (ref 0.0–0.1)
Basophils Relative: 0 %
EOS PCT: 2 %
Eosinophils Absolute: 0.1 10*3/uL (ref 0.0–0.7)
HCT: 42.3 % (ref 36.0–46.0)
Hemoglobin: 14.9 g/dL (ref 12.0–15.0)
LYMPHS ABS: 0.9 10*3/uL (ref 0.7–4.0)
Lymphocytes Relative: 23 %
MCH: 33 pg (ref 26.0–34.0)
MCHC: 35.2 g/dL (ref 30.0–36.0)
MCV: 93.8 fL (ref 78.0–100.0)
MONOS PCT: 7 %
Monocytes Absolute: 0.3 10*3/uL (ref 0.1–1.0)
Neutro Abs: 2.6 10*3/uL (ref 1.7–7.7)
Neutrophils Relative %: 68 %
PLATELETS: 189 10*3/uL (ref 150–400)
RBC: 4.51 MIL/uL (ref 3.87–5.11)
RDW: 12.6 % (ref 11.5–15.5)
WBC: 3.8 10*3/uL — ABNORMAL LOW (ref 4.0–10.5)

## 2018-03-25 LAB — BASIC METABOLIC PANEL
Anion gap: 9 (ref 5–15)
BUN: 14 mg/dL (ref 6–20)
CHLORIDE: 102 mmol/L (ref 101–111)
CO2: 25 mmol/L (ref 22–32)
CREATININE: 0.94 mg/dL (ref 0.44–1.00)
Calcium: 9.6 mg/dL (ref 8.9–10.3)
GFR calc Af Amer: >60 mL/min (ref >60)
GFR calc non Af Amer: 57 mL/min — ABNORMAL LOW (ref >60)
Glucose, Bld: 112 mg/dL — ABNORMAL HIGH (ref 65–99)
Potassium: 4.7 mmol/L (ref 3.5–5.1)
SODIUM: 136 mmol/L (ref 135–145)

## 2018-03-25 NOTE — ED Provider Notes (Signed)
Patient placed in Quick Look pathway, seen and evaluated   Chief Complaint: Dizziness  HPI:   77 y.o. female who presents for evaluation of dizziness and unsteady gait.  Patient reports that she has had gait abnormalities for the last 10 months.  Patient reports that she felt like it worsened last night.  Patient reports that this morning, she felt dizziness that she describes as "feeling like I am going to fall over."  Patient had initially told triage nurse that it started at 12 today.  Patient told me that it started earlier this morning.  Patient states that she feels like she is more shaky than normal.  She does have a history of an essential tremor but feels like it is worse at baseline.  She denies any preceding trauma, injury, fall.  Patient denies any facial asymmetry, slurred speech, numbness/weakness of her arms or legs, vision changes.  ROS: Dizziness  Physical Exam:   Gen: No distress  Neuro: Awake and Alert  Skin: Warm    Focused Exam: Cranial nerves III-XII intact Follows commands, Moves all extremities  5/5 strength to BUE and BLE  Sensation intact throughout all major nerve distributions Slightly tremulous on finger to nose but unable to assess that this is baseline essential tremor or new. No dysdiadochokinesia. No pronator drift. No gait abnormalities  No slurred speech. No facial droop.    As patient's balance issues are a chronic issue and have been ongoing for last 10 months and patient reports that dizziness began earlier this morning, no indication for code stroke at this time.   Initiation of care has begun. The patient has been counseled on the process, plan, and necessity for staying for the completion/evaluation, and the remainder of the medical screening examination   Rosana HoesLayden, Lindsey A, PA-C 03/25/18 1604    Wynetta FinesMessick, Peter C, MD 03/26/18 [phone]

## 2018-03-25 NOTE — ED Notes (Signed)
Pt given discharge information and denied further needs. Signature pad unavailable. Pt verbalized understanding.

## 2018-03-25 NOTE — Progress Notes (Signed)
Personally  participated in, made any corrections needed, and agree with history, physical, neuro exam,assessment and plan as stated above.    Antonia Ahern, MD [facility] 

## 2018-03-25 NOTE — ED Provider Notes (Signed)
[facility] EMERGENCY DEPARTMENT Provider Note   CSN: [id number] Arrival date & time: 03/25/18  1437     History   Chief Complaint Chief Complaint  Patient presents with  . Dizziness    HPI Linda Macdonald is a 77 y.o. female.  Patient here for evaluation of dizziness, and a sense of falling out of balance when she walks. Symptoms are essentially chronic for 10 months but seemed much worse x 1 day. No recent fever, pain, headache, visual changes, numbness or weakness specific to any extremity. No urinary symptoms. She is followed by Dr. Lucia Gaskins of neurology.   The history is provided by the patient. No language interpreter was used.  Dizziness    Past Medical History:  Diagnosis Date  . Arthritis   . Chronic kidney disease   . Depression   . Leukopenia 08/12/2012  . Thyroid disease     Patient Active Problem List   Diagnosis Date Noted  . Dizziness 03/10/2018  . Benign essential tremor 03/10/2018  . Disequilibrium 03/10/2018  . Lacunar stroke (HCC) 02/18/2018  . Dysesthesia of multiple sites 03/19/2014  . Restless legs syndrome (RLS) 03/19/2014  . Anemia associated with nutritional deficiency 03/19/2014  . Hypersomnia with sleep apnea, unspecified 03/19/2014  . Acute medial meniscus tear of left knee 10/16/2012  . Leukopenia 08/12/2012  . CALCULUS OF KIDNEY 10/06/2010  . WEIGHT LOSS 08/10/2010  . DIARRHEA 08/10/2010  . ABDOMINAL PAIN RIGHT LOWER QUADRANT 08/10/2010  . ERUCTATION 04/18/2010  . HYPERSOMNIA 01/24/2010  . 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 VENOUS INSUFFICIENCY, LEGS 08/29/2009  . HYPOTHYROIDISM 04/21/2008  . DEPRESSION 04/21/2008  . SLEEP APNEA, OBSTRUCTIVE 04/21/2008  . DEEP VENOUS THROMBOPHLEBITIS, LEFT, LEG, HX OF 04/21/2008  . WISDOM TEETH EXTRACTION, HX OF 04/21/2008    Past Surgical History:  Procedure Laterality Date  . EXTRACORPOREAL SHOCK WAVE LITHOTRIPSY Left 04/25/2017   Procedure: LEFT EXTRACORPOREAL  SHOCK WAVE LITHOTRIPSY (ESWL);  Surgeon: Heloise Purpura, MD;  Location: [facility];  Service: Urology;  Laterality: Left;  . KNEE ARTHROSCOPY Left 2oyrs ago   L knee x2/ R knee x1  . LITHOTRIPSY  2012   difficulty voiding after procedure, d/c home with foley per pt  . TUBAL LIGATION    . WISDOM TOOTH EXTRACTION     teenager     OB History   None      Home Medications    Prior to Admission medications   Medication Sig Start Date End Date Taking? Authorizing Provider  aspirin EC 325 MG tablet Take 1 tablet (325 mg total) by mouth daily. 02/18/18  Yes Anson Fret, MD  buPROPion (WELLBUTRIN XL) 150 MG 24 hr tablet Take 450 mg by mouth daily.   Yes [provider]  calcium citrate-vitamin D (CITRACAL+D) 315-200 MG-UNIT per tablet Take 1 tablet by mouth daily.    Yes [provider]  Cholecalciferol (VITAMIN D-3) 1000 UNITS CAPS Take 1,000 Units by mouth daily.    Yes [provider]  levothyroxine (LEVOTHROID) 25 MCG tablet Take 1 tablet (25 mcg total) by mouth daily. 04/02/11 03/25/26 Yes Norins, Rosalyn Gess, MD  Turmeric 500 MG TABS Take 500 mg by mouth daily.    Yes [provider]    Family History Family History  Problem Relation Age of Onset  . Colon cancer Maternal Grandmother     Social History Social History   Tobacco Use  . Smoking status: Never Smoker  . Smokeless tobacco: Never Used  Substance Use Topics  . Alcohol use: Yes    Comment: socially  . Drug use: No     Allergies   Erythromycin and Penicillins   Review of Systems Review of Systems  Constitutional: Negative for chills and fever.  HENT: Negative.   Respiratory: Negative.   Cardiovascular: Negative.   Gastrointestinal: Negative.   Genitourinary: Negative for enuresis.  Musculoskeletal: Negative.   Skin: Negative.   Neurological: Positive for dizziness. Negative for numbness.       See HPI.     Physical Exam Updated Vital Signs BP 136/65 (BP Location:  Right Arm)   Pulse 68   Temp 97.8 F (36.6 C) (Oral)   Resp 18   SpO2 100%   Physical Exam  Constitutional: She is oriented to person, place, and time. She appears well-developed and well-nourished.  HENT:  Head: Normocephalic.  Neck: Normal range of motion. Neck supple.  Cardiovascular: Normal rate and regular rhythm.  Pulmonary/Chest: Effort normal and breath sounds normal.  Abdominal: Soft. Bowel sounds are normal. There is no tenderness. There is no rebound and no guarding.  Musculoskeletal: Normal range of motion.  Neurological: She is alert and oriented to person, place, and time.  CN's 3-12 grossly intact. Speech is clear and focused. No facial asymmetry. No lateralizing weakness. Reflexes are equal. No deficits of coordination by finger-to-nose. Romberg negative, no pronator drift. When ambulating she reaches for something to hold onto, but no threat of fall, no foot drop or drag.  Skin: Skin is warm and dry. No rash noted.  Psychiatric: She has a normal mood and affect.     ED Treatments / Results  Labs (all labs ordered are listed, but only abnormal results are displayed) Labs Reviewed  BASIC METABOLIC PANEL - Abnormal; Notable for the following components:      Result Value   Glucose, Bld 112 (*)    GFR calc non Af Amer 57 (*)    All other components within normal limits  CBC WITH DIFFERENTIAL/PLATELET - Abnormal; Notable for the following components:   WBC 3.8 (*)    All other components within normal limits  URINALYSIS, ROUTINE W REFLEX MICROSCOPIC - Abnormal; Notable for the following components:   Hgb urine dipstick SMALL (*)    Ketones, ur 5 (*)    Leukocytes, UA SMALL (*)    Bacteria, UA RARE (*)    Squamous Epithelial / LPF 0-5 (*)    All other components within normal limits    EKG EKG Interpretation  Date/Time:  Tuesday March 25 2018 14:48:45 EDT Ventricular Rate:  81 PR Interval:  148 QRS Duration: 74 QT Interval:  354 QTC Calculation: 411 R  Axis:   74 Text Interpretation:  Normal sinus rhythm Normal ECG Confirmed by Ross MarcusHorton, Courtney ([id number]) on 03/25/2018 11:22:57 PM   Radiology Ct Head Wo Contrast  Result Date: 03/25/2018 CLINICAL DATA:  Dizziness and lower extremity weakness. EXAM: CT HEAD WITHOUT CONTRAST TECHNIQUE: Contiguous axial images were obtained from the base of the skull through the vertex without intravenous contrast. COMPARISON:  Brain MRI 02/28/2018 FINDINGS: Brain: There is no evidence of acute infarct, intracranial hemorrhage, mass, midline shift, or extra-axial fluid collection. The ventricles and sulci are normal. Bilateral basal ganglia hypodensities are unchanged and correspond to a left-sided dilated perivascular space and right-sided chronic lacunar infarct versus dilated perivascular space on the prior MRI. Vascular: No hyperdense vessel. Skull: No fracture or focal osseous lesion. Sinuses/Orbits: Visualized orbits are unremarkable. Visualized paranasal sinuses and mastoid  air cells are clear. Other: None. IMPRESSION: No evidence of acute intracranial abnormality. Electronically Signed   By: Sebastian Ache M.D.   On: 03/25/2018 17:34    Procedures Procedures (including critical care time)  Medications Ordered in ED Medications - No data to display   Initial Impression / Assessment and Plan / ED Course  I have reviewed the triage vital signs and the nursing notes.  Pertinent labs & imaging results that were available during my care of the patient were reviewed by me and considered in my medical decision making (see chart for details).     Patient presents with ongoing ataxia for months that seemed worse to her tonight. No headache, new neurologic symptoms, falls.  On evaluation, there is no evidence of infection or acute neurologic process. The patient has well established neurologic follow up in the community.   Discussed with dr. Rodena Medin who agrees the patient appears stable for discharge home. The  patient is updated and is comfortable with going home.   Final Clinical Impressions(s) / ED Diagnoses   Final diagnoses:  None   1. Ataxia  ED Discharge Orders    None       Elpidio Anis, Cordelia Poche 03/27/18 1610    Wynetta Fines, MD 04/03/18 2109

## 2018-03-25 NOTE — Discharge Instructions (Addendum)
Follow up with Dr. Lucia GaskinsAhern for recheck of worsening symptoms of feeling off balance when walking.

## 2018-03-25 NOTE — ED Triage Notes (Signed)
Patient complains dizziness and unsteady gait that started at 1200 today. History of essential tremor.  Strength equal bilaterally. Speech normal. No facial droop. 2/10 headache.

## 2018-03-31 ENCOUNTER — Other Ambulatory Visit (HOSPITAL_COMMUNITY): Payer: Self-pay | Admitting: Neurology

## 2018-03-31 ENCOUNTER — Ambulatory Visit: Payer: Medicare Other | Admitting: Neurology

## 2018-03-31 ENCOUNTER — Encounter: Payer: Self-pay | Admitting: Neurology

## 2018-03-31 VITALS — BP 128/60 | HR 72 | Ht 64.0 in | Wt 140.0 lb

## 2018-03-31 DIAGNOSIS — G250 Essential tremor: Secondary | ICD-10-CM | POA: Diagnosis not present

## 2018-03-31 DIAGNOSIS — R1319 Other dysphagia: Secondary | ICD-10-CM

## 2018-03-31 DIAGNOSIS — R1310 Dysphagia, unspecified: Secondary | ICD-10-CM

## 2018-03-31 MED ORDER — PROPRANOLOL HCL 10 MG PO TABS: 10.0000 mg | ORAL_TABLET | Freq: Three times a day (TID) | ORAL | 6 refills | Status: DC | PRN

## 2018-03-31 NOTE — Patient Instructions (Addendum)
May consider Adding an SSRI to the Wellbutrin such as prozac or celexa - discuss with Dr. Hal Hopeichter  Propranolol very low dose for tremors  Physical therapy for imbalance and vestibular symptoms  Propranolol tablets What is this medicine? PROPRANOLOL (proe PRAN oh lole) is a beta-blocker. Beta-blockers reduce the workload on the heart and help it to beat more regularly. This medicine is used to treat high blood pressure, to control irregular heart rhythms (arrhythmias) and to relieve chest pain caused by angina. It may also be helpful after a heart attack. This medicine is also used to prevent migraine headaches, relieve uncontrollable shaking (tremors), and help certain problems related to the thyroid gland and adrenal gland. This medicine may be used for other purposes; ask your health care provider or pharmacist if you have questions. COMMON BRAND NAME(S): Inderal What should I tell my health care provider before I take this medicine? They need to know if you have any of these conditions: -circulation problems or blood vessel disease -diabetes -history of heart attack or heart disease, vasospastic angina -kidney disease -liver disease -lung or breathing disease, like asthma or emphysema -pheochromocytoma -slow heart rate -thyroid disease -an unusual or allergic reaction to propranolol, other beta-blockers, medicines, foods, dyes, or preservatives -pregnant or trying to get pregnant -breast-feeding How should I use this medicine? Take this medicine by mouth with a glass of water. Follow the directions on the prescription label. Take your doses at regular intervals. Do not take your medicine more often than directed. Do not stop taking except on your the advice of your doctor or health care professional. Talk to your pediatrician regarding the use of this medicine in children. Special care may be needed. Overdosage: If you think you have taken too much of this medicine contact a poison  control center or emergency room at once. NOTE: This medicine is only for you. Do not share this medicine with others. What if I miss a dose? If you miss a dose, take it as soon as you can. If it is almost time for your next dose, take only that dose. Do not take double or extra doses. What may interact with this medicine? Do not take this medicine with any of the following medications: -feverfew -phenothiazines like chlorpromazine, mesoridazine, prochlorperazine, thioridazine This medicine may also interact with the following medications: -aluminum hydroxide gel -antipyrine -antiviral medicines for HIV or AIDS -barbiturates like phenobarbital -certain medicines for blood pressure, heart disease, irregular heart beat -cimetidine -ciprofloxacin -diazepam -fluconazole -haloperidol -isoniazid -medicines for cholesterol like cholestyramine or colestipol -medicines for mental depression -medicines for migraine headache like almotriptan, eletriptan, frovatriptan, naratriptan, rizatriptan, sumatriptan, zolmitriptan -NSAIDs, medicines for pain and inflammation, like ibuprofen or naproxen -phenytoin -rifampin -teniposide -theophylline -thyroid medicines -tolbutamide -warfarin -zileuton This list may not describe all possible interactions. Give your health care provider a list of all the medicines, herbs, non-prescription drugs, or dietary supplements you use. Also tell them if you smoke, drink alcohol, or use illegal drugs. Some items may interact with your medicine. What should I watch for while using this medicine? Visit your doctor or health care professional for regular check ups. Check your blood pressure and pulse rate regularly. Ask your health care professional what your blood pressure and pulse rate should be, and when you should contact them. You may get drowsy or dizzy. Do not drive, use machinery, or do anything that needs mental alertness until you know how this drug affects  you. Do not stand or sit up quickly, especially  if you are an older patient. This reduces the risk of dizzy or fainting spells. Alcohol can make you more drowsy and dizzy. Avoid alcoholic drinks. This medicine can affect blood sugar levels. If you have diabetes, check with your doctor or health care professional before you change your diet or the dose of your diabetic medicine. Do not treat yourself for coughs, colds, or pain while you are taking this medicine without asking your doctor or health care professional for advice. Some ingredients may increase your blood pressure. What side effects may I notice from receiving this medicine? Side effects that you should report to your doctor or health care professional as soon as possible: -allergic reactions like skin rash, itching or hives, swelling of the face, lips, or tongue -breathing problems -changes in blood sugar -cold hands or feet -difficulty sleeping, nightmares -dry peeling skin -hallucinations -muscle cramps or weakness -slow heart rate -swelling of the legs and ankles -vomiting Side effects that usually do not require medical attention (report to your doctor or health care professional if they continue or are bothersome): -change in sex drive or performance -diarrhea -dry sore eyes -hair loss -nausea -weak or tired This list may not describe all possible side effects. Call your doctor for medical advice about side effects. You may report side effects to FDA at 1-800-FDA-1088. Where should I keep my medicine? Keep out of the reach of children. Store at room temperature between 15 and 30 degrees C (59 and 86 degrees F). Protect from light. Throw away any unused medicine after the expiration date. NOTE: This sheet is a summary. It may not cover all possible information. If you have questions about this medicine, talk to your doctor, pharmacist, or health care provider.  2018 Elsevier/Gold Standard (2013-07-31  14:51:53)  Essential Tremor A tremor is trembling or shaking that you cannot control. Most tremors affect the hands or arms. Tremors can also affect the head, vocal cords, face, and other parts of the body. Essential tremor is a tremor without a known cause. What are the causes? Essential tremor has no known cause. What increases the risk? You may be at greater risk of essential tremor if:  You have a family member with essential tremor.  You are age 1 or older.  You take certain medicines.  What are the signs or symptoms? The main sign of a tremor is uncontrolled and unintentional rhythmic shaking of a body part.  You may have difficulty eating with a spoon or fork.  You may have difficulty writing.  You may nod your head up and down or side to side.  You may have a quivering voice.  Your tremors:  May get worse over time.  May come and go.  May be more noticeable on one side of your body.  May get worse due to stress, fatigue, caffeine, and extreme heat or cold.  How is this diagnosed? Your health care provider can diagnose essential tremor based on your symptoms, medical history, and a physical examination. There is no single test to diagnose an essential tremor. However, your health care provider may perform a variety of tests to rule out other conditions. Tests may include:  Blood and urine tests.  Imaging studies of your brain, such as: ? CT scan. ? MRI.  A test that measures involuntary muscle movement (electromyogram).  How is this treated? Your tremors may go away without treatment. Mild tremors may not need treatment if they do not affect your day-to-day life. Severe tremors may  need to be treated using one or a combination of the following options:  Medicines. This may include medicine that is injected.  Lifestyle changes.  Physical therapy.  Follow these instructions at home:  Take medicines only as directed by your health care  provider.  Limit alcohol intake to no more than 1 drink per day for nonpregnant women and 2 drinks per day for men. One drink equals 12 oz of beer, 5 oz of wine, or 1 oz of hard liquor.  Do not use any tobacco products, including cigarettes, chewing tobacco, or electronic cigarettes. If you need help quitting, ask your health care provider.  Take medicines only as directed by your health care provider.  Avoid extreme heat or cold.  Limit the amount of caffeine you consumeas directed by your health care provider.  Try to get eight hours of sleep each night.  Find ways to manage your stress, such as meditation or yoga.  Keep all follow-up visits as directed by your health care provider. This is important. This includes any physical therapy visits. Contact a health care provider if:  You experience any changes in the location or intensity of your tremors.  You start having a tremor after starting a new medicine.  You have tremor with other symptoms such as: ? Numbness. ? Tingling. ? Pain. ? Weakness.  Your tremor gets worse.  Your tremor interferes with your daily life. This information is not intended to replace advice given to you by your health care provider. Make sure you discuss any questions you have with your health care provider. Document Released: 12/17/2014 Document Revised: 05/03/2016 Document Reviewed: 05/24/2014 Elsevier Interactive Patient Education  [employer].

## 2018-03-31 NOTE — Progress Notes (Signed)
[facility]    Provider:  Dr Lucia Gaskins Referring Provider: Dois Davenport, MD Primary Care Physician:  Dois Davenport, MD  CC:  "Equilibrium is off"  Interval history 03/31/2018: Patient was evaluated for tremor and imbalance. She was prescribed propranolol and never took it. She was prescribed PT but did not go as she sees a trainer 2x a week and takes yoga.  MRI of the brain and MRI of the cervical spine showed nothing acute.  She was seen by her nurse practitioner with multiple neurologic complaints including equilibrium, dizziness and vertiginous symptoms.  Lacunar infarct seen on CT of the head was possibly an expanded virtual robins space, writing sample did not indicate essential tremor nor did spiral drawing. She is still having difficulty with episodes of "shakiness" and was recently in the ED with dizziness. Still having difficulty swallowing pills. Will order a swallowing eval. Will order propranolol 10mg  as needed for tremor. She has been seeing Physiological scientist last fall. Sh eis going to PT today and asked them to do vestibular therapy.   HPI:  Linda Macdonald is a 77 y.o. female here as a referral from Dr. Hal Hope for a new consult. She feels her"equilibrium is off". She feels when she walks she has difficulty. She stubbed her foot on the carpet once. Started last summer, no inciting event or head trauma. No falls. Happens every day. Several times she has noticed a tremor. Usually when holding something. She just keeps saying she is "off", she can;t explain it it is just that she sometimes takes a wrong step or an extra step to the right. No resting tremor. She wakes sometimes and can;t go back to sleep. Her first cousin has MS. No neuropathic disorders or neuromuscular disorders, no parkinson's disease in the family. Father had dementia in his mid 66s. 3x, not lately, her visual picture has "flipped up" for 30 seconds, no headache, no numbness, tingling,  weakness. No symptoms in the feet, no numbness or tingling in the feet. No loss of smell or taste. She has chronic double vision treated with lenses. Grandmother had tremors. No other focal neurologic deficits, associated symptoms, inciting events or modifiable factors.  Reviewed notes, labs and imaging from outside physicians, which showed:  Personally reviewed Ct head and agree with the following:  TSH normal, b12 normal in the past  IMPRESSION: 1. No acute intracranial abnormality. 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  Reviewed notes.  Patient was seen in May 2015 for tingling dysesthesias.  Constant tingling sensation in her feet and is slowly ascending.  Unclear what initially started on.  Onset was 2 years prior she first noticed it.  It started affecting her sleep and more difficult to fall asleep.  Also started noticing trouble with balance and fine motor skills especially during the dance class.  She developed some clamminess and some off-balance feeling.  She continued to be physically active.  Thyroid tests and vitamin D deficiencies were ruled out.  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  She was placed on a dental device for bruxism and snoring mild apnea.  She was tried on Requip.  Reviewed EMG nerve conduction study results and data, right upper extremity and both lower extremities showed no evidence of a peripheral neuropathy or lumbosacral radiculopathy.   Review of Systems: Patient complains of symptoms per HPI as well as the following symptoms:  insomnia. Pertinent negatives and positives per HPI. All others negative.   Social History   Socioeconomic History  . Marital status: Widowed    Spouse name: Not on file  . Number of children: 3  . Years of education: College  . Highest education level: Not on file  Occupational History  . Not on file    Social Needs  . Financial resource strain: Not on file  . Food insecurity:    Worry: Not on file    Inability: Not on file  . Transportation needs:    Medical: Not on file    Non-medical: Not on file  Tobacco Use  . Smoking status: Never Smoker  . Smokeless tobacco: Never Used  Substance and Sexual Activity  . Alcohol use: Yes    Comment: socially  . Drug use: No  . Sexual activity: Not on file  Lifestyle  . Physical activity:    Days per week: Not on file    Minutes per session: Not on file  . Stress: Not on file  Relationships  . Social connections:    Talks on phone: Not on file    Gets together: Not on file    Attends religious service: Not on file    Active member of club or organization: Not on file    Attends meetings of clubs or organizations: Not on file    Relationship status: Not on file  . Intimate partner violence:    Fear of current or ex partner: Not on file    Emotionally abused: Not on file    Physically abused: Not on file    Forced sexual activity: Not on file  Other Topics Concern  . Not on file  Social History Narrative   Patient is a widow and lives alone with her dog.   Patient has three children.   Patient is retired.   Patient has a college education.   Patient is right-handed.   Caffeine: Patient drinks 1 cup of coffee 2-3 times per week       Family History  Problem Relation Age of Onset  . Colon cancer Maternal Grandmother     Past Medical History:  Diagnosis Date  . Arthritis   . Chronic kidney disease   . Depression   . Leukopenia 08/12/2012  . Thyroid disease     Past Surgical History:  Procedure Laterality Date  . EXTRACORPOREAL SHOCK WAVE LITHOTRIPSY Left 04/25/2017   Procedure: LEFT EXTRACORPOREAL SHOCK WAVE LITHOTRIPSY (ESWL);  Surgeon: Heloise Purpura, MD;  Location: [facility];  Service: Urology;  Laterality: Left;  . KNEE ARTHROSCOPY Left 2oyrs ago   L knee x2/ R knee x1  . LITHOTRIPSY  2012   difficulty voiding after  procedure, d/c home with foley per pt  . TUBAL LIGATION    . WISDOM TOOTH EXTRACTION     teenager    Current Outpatient Medications  Medication Sig Dispense Refill  . aspirin EC 325 MG tablet Take 1 tablet (325 mg total) by mouth daily. 30 tablet 0  . buPROPion (WELLBUTRIN XL) 150 MG 24 hr tablet Take 450 mg by mouth daily.    Marland Kitchen levothyroxine (LEVOTHROID) 25 MCG tablet Take 1 tablet (25 mcg total) by mouth daily. 90 tablet 3  . calcium citrate-vitamin D (CITRACAL+D) 315-200 MG-UNIT per tablet Take 1 tablet by mouth daily.     . Cholecalciferol (VITAMIN D-3) 1000 UNITS CAPS Take 1,000 Units by mouth daily.     . Turmeric 500 MG TABS  Take 500 mg by mouth daily.      No current facility-administered medications for this visit.     Allergies as of 03/31/2018 - Review Complete 03/31/2018  Allergen Reaction Noted  . Erythromycin Swelling 11/13/2007  . Penicillins Swelling and Rash 11/13/2007    Vitals: BP 128/60 (BP Location: Right Arm, Patient Position: Sitting)   Pulse 72   Ht 5\' 4"  (1.626 m)   Wt 140 lb (63.5 kg)   BMI 24.03 kg/m  Last Weight:  Wt Readings from Last 1 Encounters:  03/31/18 140 lb (63.5 kg)   Last Height:   Ht Readings from Last 1 Encounters:  03/31/18 5\' 4"  (1.626 m)    Physical exam: Exam: Gen: NAD, conversant, well nourised, thin, well groomed                     CV: RRR, no MRG. No Carotid Bruits. No peripheral edema, warm, nontender Eyes: Conjunctivae clear without exudates or hemorrhage  Neuro: Detailed Neurologic Exam  Speech:    Speech is normal; fluent and spontaneous with normal comprehension.  Cognition:    The patient is oriented to person, place, and time;     recent and remote memory intact;     language fluent;     normal attention, concentration,     fund of knowledge Cranial Nerves:    The pupils are equal, round, and reactive to light. The fundi are normal and spontaneous venous pulsations are present. Visual fields are full to  finger confrontation. Extraocular movements are intact. Trigeminal sensation is intact and the muscles of mastication are normal. The face is symmetric. The palate elevates in the midline. Hearing intact. Voice is normal. Shoulder shrug is normal. The tongue has normal motion without fasciculations.   Coordination:    Normal finger to nose and heel to shin. Normal rapid alternating movements.   Gait:    Heel-toe and tandem gait are normal.   Motor Observation:    No asymmetry, no atrophy, and no involuntary movements noted. Tone:    Normal muscle tone.    Posture:    Posture is normal. normal erect    Strength:    Strength is V/V in the upper and lower limbs.      Sensation: intact to LT     Reflex Exam:  DTR's:    Deep tendon reflexes in the upper and lower extremities are brisk bilaterally.   Toes:    The toes are downgoing bilaterally.   Clonus:    Clonus is absent.      Assessment/Plan:  77 year old with multiple neurologic complaints including equilibrium difficulty, ataxia, dizziness and vertiginous symptoms. Lacunar infarct seen on CT head.   Patient is back today.  She still continues to complain of her symptoms multiple neurologic complaints.  MRI of the brain was unremarkable for etiology may have shown a lacunar infarct however also could be a virtual Robin space but this would not explain her symptoms and we discussed this with her and advised her to continue on aspirin prevention, watch her cholesterol blood pressure diabetes and other vascular risk factors.  - Episodes of shakiness: propranolol prn  - Speech and swallow study  - PT for imbalance, as well as dizziness vestibular symptoms   I had a long d/w patient about her remote lacunar stroke which I believe isa Vichow-Robbins space , risk for recurrent stroke/TIAs, personally independently reviewed imaging studies and stroke evaluation results and answered questions. Start ASA  325mg  for secondary stroke  prevention and maintain strict control of hypertension with blood pressure goal below 130/90, diabetes with hemoglobin A1c goal below 6.5% and lipids with LDL cholesterol goal below 70 mg/dL. I also advised the patient to eat a healthy diet with plenty of whole grains, cereals, fruits and vegetables, exercise regularly and maintain ideal body weight .   Cc: Dr. Obie Dredgeichter  Antonia Ahern, MD  [facility] [address] [place] [id number]  Phone [phone] Fax [phone]  A total of 25 minutes was spent face-to-face with this patient. Over half this time was spent on counseling patient on the tremor, dysphagia diagnosis and different diagnostic and therapeutic options, counseling and coordination of care, risks ans benefits of management, compliance, or risk factor reduction and education.

## 2018-04-04 ENCOUNTER — Encounter: Payer: Self-pay | Admitting: Physical Therapy

## 2018-04-04 ENCOUNTER — Other Ambulatory Visit: Payer: Self-pay

## 2018-04-04 ENCOUNTER — Ambulatory Visit: Payer: Medicare Other | Attending: Neurology | Admitting: Physical Therapy

## 2018-04-04 DIAGNOSIS — H8111 Benign paroxysmal vertigo, right ear: Secondary | ICD-10-CM | POA: Insufficient documentation

## 2018-04-04 DIAGNOSIS — R2681 Unsteadiness on feet: Secondary | ICD-10-CM | POA: Diagnosis present

## 2018-04-04 DIAGNOSIS — R42 Dizziness and giddiness: Secondary | ICD-10-CM | POA: Diagnosis not present

## 2018-04-04 DIAGNOSIS — R260 Ataxic gait: Secondary | ICD-10-CM | POA: Insufficient documentation

## 2018-04-04 NOTE — Therapy (Signed)
[facility] [address] [place], [place], [address] Phone: [phone]   Fax:  [phone]  Physical Therapy Evaluation  Patient Details  Name: Linda Macdonald MRN: [id number] Date of Birth: 76-Mar-1942 Referring Provider: Melvenia Beam, MD   Encounter Date: 04/04/2018  PT End of Session - 04/04/18 1235    Visit Number  1    Number of Visits  17    Date for PT Re-Evaluation  06/03/18    Authorization Type  [employer]; VL: [place]    PT Start Time  1108    PT Stop Time  1203    PT Time Calculation (min)  55 min    Activity Tolerance  Patient tolerated treatment well    Behavior During Therapy  [facility] for tasks assessed/performed       Past Medical History:  Diagnosis Date  . Arthritis   . Chronic kidney disease   . Depression   . Leukopenia 08/12/2012  . Thyroid disease     Past Surgical History:  Procedure Laterality Date  . EXTRACORPOREAL SHOCK WAVE LITHOTRIPSY Left 04/25/2017   Procedure: LEFT EXTRACORPOREAL SHOCK WAVE LITHOTRIPSY (ESWL);  Surgeon: Raynelle Bring, MD;  Location: [facility];  Service: Urology;  Laterality: Left;  . KNEE ARTHROSCOPY Left 2oyrs ago   L knee x2/ R knee x1  . LITHOTRIPSY  2012   difficulty voiding after procedure, d/c home with foley per pt  . TUBAL LIGATION    . WISDOM TOOTH EXTRACTION     teenager    There were no vitals filed for this visit.   Subjective Assessment - 04/04/18 1113    Subjective  Pt reports starting last summer her equilibrium was off and had to stop riding her bike.  Saw PCP and was referred to neurology.  It has progressively gotten worse and she notices intermittent veering when she ambulates.  Happens intermittently without warning or pattern.    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  Did show chronic lacunar basal ganglia infarct vs. expanding virchow robin space.  Normal nerve conduction study/EMG.    Patient Stated Goals  To improve balance     Currently in Pain?  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  Having difficulty with swallowing; has speech eval ordered      Balance Screen   Has the patient fallen in the past 6 months  Yes    How many times?  1 in the garden, fell over    Has the patient had a decrease in activity level because of a fear of falling?   Yes    Is the patient reluctant to leave their home because of  a fear of falling?   No      Home Environment   Living Environment  Private residence    Living Arrangements  Alone    Type of [place] to enter    Entrance Stairs-Number of Steps  3    Entrance Stairs-Rails  Left    Home Layout  Two level;Able to live on main level with bedroom/bathroom      Prior Function   Level of Independence  Independent    Vocation  Retired    Leisure  gardening, walking on treadmill, has a Physiological scientist, yoga, [place]      Observation/Other Assessments   Focus on Therapeutic Outcomes (FOTO)   64% (36% limited; predicted 23% limitation at D/C)      Sensation   Light Touch  Appears Intact    Proprioception  Appears Intact      Coordination   Gross Motor Movements are Fluid and Coordinated  Yes    Finger Nose Finger Test  [facility]    Heel Shin Test  WFL       ROM / Strength   AROM / PROM / Strength  Strength      Strength   Overall Strength  Within functional limits for tasks performed    Overall Strength Comments  HIP ABD strength in sidelying WFL      Ambulation/Gait   Ambulation/Gait  Yes    Ambulation/Gait Assistance  4: Min guard    Ambulation Distance (Feet)  115 Feet    Assistive device  Other (Comment) finger tip touch to furniture or walls    Gait Pattern  Step-through pattern;Narrow base of support;Ataxic;Scissoring    Ambulation Surface  Level;Indoor    Gait Comments  --      Standardized Balance Assessment   Standardized Balance Assessment  10 meter walk test    10 Meter Walk  10 seconds or 3.2 ft/sec           Vestibular Assessment - 04/04/18 1126      Vestibular Assessment   General Observation  pt reports mild disequilibrium today, veering when looking to L or R, furniture walks at home.  Denies changes in vision or hearing, has premorbid diplopia with special prescription to correct, denies tinnitus or aural fullness, denies headaches, nausea or vomiting.  Denies recent infections or use of antibiotics.        Symptom Behavior   Type of Dizziness  Imbalance    Frequency of Dizziness  intermittent but daily    Duration of Dizziness  brief     Aggravating Factors  Spontaneous onset    Relieving Factors  Comments sitting or having UE support      Occulomotor Exam   Occulomotor Alignment  Normal    Spontaneous  Absent    Gaze-induced  Absent    Smooth Pursuits  Saccades    Saccades  Intact    Comment  Convergence impaired - premorbid      Vestibulo-Occular Reflex   VOR to Slow Head Movement  Normal    VOR Cancellation  Normal    Comment  HIT: + to L for refixation saccade, negative to R      Visual Acuity   Static  8    Dynamic  7      Positional Testing   Dix-Hallpike  Dix-Hallpike Right;Dix-Hallpike Left    Horizontal Canal Testing  Horizontal Canal Right;Horizontal Canal Left  Dix-Hallpike  Right   Dix-Hallpike Right Duration  30 seconds    Dix-Hallpike Right Symptoms  Downbeat Nystagmus      Dix-Hallpike Left   Dix-Hallpike Left Duration  0    Dix-Hallpike Left Symptoms  No nystagmus      Horizontal Canal Right   Horizontal Canal Right Duration  0    Horizontal Canal Right Symptoms  Normal      Horizontal Canal Left   Horizontal Canal Left Duration  0    Horizontal Canal Left Symptoms  Normal          Objective measurements completed on examination: See above findings.              PT Education - 04/04/18 1235    Education provided  Yes    Education Details  clinical findings, PT POC and goals    Person(s) Educated  Patient    Methods  Explanation    Comprehension  Verbalized understanding       PT Short Term Goals - 04/04/18 1240      PT SHORT TERM GOAL #1   Title  Pt will participate in further balance and vestibular training with assessment of FGA, SOT and MSQ.    Time  4    Period  Weeks    Status  New    Target Date  05/04/18      PT SHORT TERM GOAL #2   Title  Pt will participate in further assessment of R anterior canal (with use of frenzels) and tolerate treatment of anterior canal BPPV if indicated    Time  4    Period  Weeks    Status  New    Target Date  05/04/18      PT SHORT TERM GOAL #3   Title  Pt will improve gait velocity to >/= 3.5 ft/sec with more normal BOS    Baseline  3.2 ft/sec, ataxic    Time  4    Period  Weeks    Status  New    Target Date  05/04/18      PT SHORT TERM GOAL #4   Title  Pt will report 25% reduction in need to touch furniture/walls when ambulating and reduction in sudden veering    Time  4    Period  Weeks    Status  New    Target Date  05/04/18        PT Long Term Goals - 04/04/18 1257      PT LONG TERM GOAL #1   Title  Pt will be independent with HEP    Time  8    Status  New    Target Date  06/03/18      PT LONG TERM GOAL #2   Title  Pt will improve gait velocity to >/= 3.8  ft/sec with more normal BOS and 50% reduction in need to touch furniture/walls and reduction in veering    Time  8    Period  Weeks    Status  New    Target Date  06/03/18      PT LONG TERM GOAL #3   Title  SOT goal TBD      PT LONG TERM GOAL #4   Title  Pt will improve FGA score by 4 points    Baseline  TBD    Time  8    Period  Weeks    Status  New    Target Date  06/03/18      PT LONG TERM GOAL #5   Title  Pt will report improvement in function on FOTO to >/= 74%    Baseline  64%    Time  8    Period  Weeks    Status  New    Target Date  06/03/18             Plan - 04/04/18 1236    Clinical Impression Statement  Pt is a 77 year old female referred to Neuro OPPT for evaluation of ongoing dizziness and imbalance that began about a year ago.  Pt has been evaluated by neurology for paresthesias but EMG and nerve conduction was [facility].  Pt has also had recent CT and MRI of brain and cervical spine with no acute abnormalities; only finding was chronic lacunar infarct basal ganglia vs. expanding [employer].  Pt is also reporting new onset of difficulty with swallowing; speech evaluation has been ordered.  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  The following deficits were noted during pt's exam: disequilibrium, mildly impaired VOR to L but DVA was [facility], mild downbeat nystagmus of short duration during R hallpike-dix, ataxic gait and impaired balance.  Pt's gait velocity is slightly below functional limits for independent community dwelling adult and pt currently requires light UE touch/support during gait to prevent veering or LOB.  Pt currently lives alone and does not use an AD; she would like to remain as independent and active as possible and would benefit from skilled PT to address these impairments  and functional limitations to maximize functional mobility independence and reduce falls risk.    History and Personal Factors relevant to plan of care:  swallowing difficuty, dizziness and imbalance of unknown etiology; MRI demonstrated chronic lacunar CVA vs. 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, decompensating with fatigue - pt has glasses to correct, OA, chronic kidney disease, essential tremor, depression, leukopenia and thyroid disease    Clinical Decision Making  Moderate    Rehab Potential  Fair    Clinical Impairments Affecting Rehab Potential  uknown etiology of deficits    PT Frequency  2x / week    PT Duration  8 weeks    PT Treatment/Interventions  ADLs/Self Care Home Management;Canalith Repostioning;DME Instruction;Gait training;Stair training;Functional mobility training;Therapeutic activities;Therapeutic exercise;Balance training;Neuromuscular re-education;Patient/family education;Vestibular    PT Next Visit Plan  assess FGA if safe to, reset goal.  SOT, reset goal.   Re-assess R anterior canal for BPPV, treat if indicated (use frenzels?).  initiate HEP    Recommended Other Services  SLP    Consulted and Agree with Plan of Care  Patient       Patient will benefit from skilled therapeutic intervention in order to improve the following deficits and impairments:   Abnormal gait, Decreased  balance, Difficulty walking, Dizziness  Visit Diagnosis: Dizziness and giddiness  Unsteadiness on feet  BPPV (benign paroxysmal positional vertigo), right  Ataxic gait     Problem List Patient Active Problem List   Diagnosis Date Noted  . Dizziness 03/10/2018  . Benign essential tremor 03/10/2018  . Disequilibrium 03/10/2018  . Lacunar stroke ([place]) 02/18/2018  . Dysesthesia of multiple sites 03/19/2014  . Restless legs syndrome (RLS) 03/19/2014  . Anemia associated with nutritional deficiency 03/19/2014  . Hypersomnia with sleep apnea, unspecified 03/19/2014  . Acute medial meniscus tear of left knee 10/16/2012  . Leukopenia 08/12/2012  . CALCULUS OF KIDNEY 10/06/2010  . WEIGHT LOSS 08/10/2010  . DIARRHEA 08/10/2010  . ABDOMINAL PAIN RIGHT LOWER QUADRANT 08/10/2010  . ERUCTATION 04/18/2010  . HYPERSOMNIA 01/24/2010  . 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 VENOUS INSUFFICIENCY, LEGS 08/29/2009  . HYPOTHYROIDISM 04/21/2008  . DEPRESSION 04/21/2008  . SLEEP APNEA, OBSTRUCTIVE 04/21/2008  . DEEP VENOUS THROMBOPHLEBITIS, LEFT, LEG, HX OF 04/21/2008  . WISDOM TEETH EXTRACTION, HX OF 04/21/2008    Rico Junker, PT, DPT 04/04/18    1:04 PM    [facility] [address] [place], [place], [address] Phone: [phone]   Fax:  [phone]  Name: CHARNA NEEB MRN: [id number] Date of Birth: 12/26/1940

## 2018-04-08 DIAGNOSIS — J3089 Other allergic rhinitis: Secondary | ICD-10-CM | POA: Diagnosis not present

## 2018-04-08 DIAGNOSIS — F39 Unspecified mood [affective] disorder: Secondary | ICD-10-CM | POA: Diagnosis not present

## 2018-04-08 DIAGNOSIS — H6990 Unspecified Eustachian tube disorder, unspecified ear: Secondary | ICD-10-CM | POA: Diagnosis not present

## 2018-04-08 DIAGNOSIS — R251 Tremor, unspecified: Secondary | ICD-10-CM | POA: Diagnosis not present

## 2018-04-09 ENCOUNTER — Ambulatory Visit (HOSPITAL_COMMUNITY)
Admission: RE | Admit: 2018-04-09 | Discharge: 2018-04-09 | Disposition: A | Discharge status: Home / Self Care | Payer: Medicare Other | Source: Ambulatory Visit | Attending: Neurology | Admitting: Neurology

## 2018-04-09 DIAGNOSIS — R1310 Dysphagia, unspecified: Secondary | ICD-10-CM

## 2018-04-09 DIAGNOSIS — R1314 Dysphagia, pharyngoesophageal phase: Secondary | ICD-10-CM | POA: Diagnosis not present

## 2018-04-09 DIAGNOSIS — R1319 Other dysphagia: Secondary | ICD-10-CM | POA: Diagnosis present

## 2018-04-10 ENCOUNTER — Telehealth: Payer: Self-pay | Admitting: *Deleted

## 2018-04-10 DIAGNOSIS — R1313 Dysphagia, pharyngeal phase: Secondary | ICD-10-CM

## 2018-04-10 DIAGNOSIS — R1314 Dysphagia, pharyngoesophageal phase: Secondary | ICD-10-CM

## 2018-04-10 NOTE — Telephone Encounter (Addendum)
Spoke with patient regarding her swallowing function test. Patient stated that everything went well yesterday and she will take her pills with applesauce. The recommendation was for pt to take regular solid foods, use puree for medications. 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 The patient verbalized understanding and would like the referral placed to ENT.   Referral to ENT placed.   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 This is out of my specialty area and I don;t know much about it, if she would like an ENT referral we can place it please ask her thanks

## 2018-04-16 DIAGNOSIS — K210 Gastro-esophageal reflux disease with esophagitis: Secondary | ICD-10-CM | POA: Diagnosis not present

## 2018-04-16 DIAGNOSIS — R130 Aphagia: Secondary | ICD-10-CM | POA: Diagnosis not present

## 2018-04-16 DIAGNOSIS — B370 Candidal stomatitis: Secondary | ICD-10-CM | POA: Diagnosis not present

## 2018-04-18 ENCOUNTER — Ambulatory Visit: Payer: Medicare Other | Attending: Neurology | Admitting: Physical Therapy

## 2018-04-18 ENCOUNTER — Encounter: Payer: Self-pay | Admitting: Physical Therapy

## 2018-04-18 DIAGNOSIS — R42 Dizziness and giddiness: Secondary | ICD-10-CM | POA: Diagnosis not present

## 2018-04-18 DIAGNOSIS — R2681 Unsteadiness on feet: Secondary | ICD-10-CM

## 2018-04-18 DIAGNOSIS — R260 Ataxic gait: Secondary | ICD-10-CM | POA: Diagnosis not present

## 2018-04-18 NOTE — Patient Instructions (Signed)
Gaze Stabilization: Sitting    Keeping eyes on target on wall 3 feet away, and move head side to side for _30-60___ seconds. Repeat while moving head up and down for __30-60__ seconds. Do __2-3__ sessions per day.  Copyright  VHI. All rights reserved.   Gaze Stabilization: Tip Card  1.Target must remain in focus, not blurry, and appear stationary while head is in motion. 2.Perform exercises with small head movements (45 to either side of midline). 3.Increase speed of head motion so long as target is in focus. 4.If you wear eyeglasses, be sure you can see target through lens (therapist will give specific instructions for bifocal / progressive lenses). 5.These exercises may provoke dizziness or nausea. Work through these symptoms. If too dizzy, slow head movement slightly. Rest between each exercise. 6.Exercises demand concentration; avoid distractions.  Copyright  VHI. All rights reserved.     

## 2018-04-18 NOTE — Therapy (Signed)
[facility] [address] [place], [place], [address] Phone: [phone]   Fax:  [phone]  Physical Therapy Treatment  Patient Details  Name: Linda Macdonald MRN: [id number] Date of Birth: [birth year] Referring Provider: Anson Fret, MD   Encounter Date: 04/18/2018  PT End of Session - 04/18/18 1248    Visit Number  2    Number of Visits  17    Date for PT Re-Evaluation  06/03/18    Authorization Type  [employer]; [place]: [place]    PT Start Time  1152    PT Stop Time  1230    PT Time Calculation (min)  38 min    Activity Tolerance  Patient tolerated treatment well    Behavior During Therapy  [facility] for tasks assessed/performed       Past Medical History:  Diagnosis Date  . Arthritis   . Chronic kidney disease   . Depression   . Leukopenia 08/12/2012  . Thyroid disease     Past Surgical History:  Procedure Laterality Date  . EXTRACORPOREAL SHOCK WAVE LITHOTRIPSY Left 04/25/2017   Procedure: LEFT EXTRACORPOREAL SHOCK WAVE LITHOTRIPSY (ESWL);  Surgeon: Heloise Purpura, MD;  Location: [facility];  Service: Urology;  Laterality: Left;  . KNEE ARTHROSCOPY Left 2oyrs ago   L knee x2/ R knee x1  . LITHOTRIPSY  2012   difficulty voiding after procedure, d/c home with foley per pt  . TUBAL LIGATION    . WISDOM TOOTH EXTRACTION     teenager    There were no vitals filed for this visit.  Subjective Assessment - 04/18/18 1154    Subjective  Still dealing with feeling wobbly; no falls but a few almost falls.    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  Did show chronic lacunar basal ganglia infarct vs. expanding virchow robin space.  Normal nerve conduction study/EMG.    Patient  Stated Goals  To improve balance     Currently in Pain?  No/denies         [facility] PT Assessment - 04/18/18 1155      Functional Gait  Assessment   Gait assessed   Yes    Gait Level Surface  Walks 20 ft in less than 7 sec but greater than 5.5 sec, uses assistive device, slower speed, mild gait deviations, or deviates 6-10 in outside of the 12 in walkway width.    Change in Gait Speed  Able to smoothly change walking speed without loss of balance or gait deviation. Deviate no more than 6 in outside of the 12 in walkway width.    Gait with Horizontal Head Turns  Performs head turns smoothly with slight change in gait velocity (eg, minor disruption to smooth gait path), deviates 6-10 in outside 12 in walkway width, or uses an assistive device.    Gait with Vertical Head Turns  Performs task with slight change in gait velocity (eg, minor disruption to smooth gait path), deviates 6 - 10 in outside 12 in walkway width or uses assistive device    Gait and Pivot Turn  Pivot turns safely in greater than 3 sec and stops with no loss of balance, or pivot turns safely within 3 sec and  stops with mild imbalance, requires small steps to catch balance.    Step Over Obstacle  Is able to step over one shoe box (4.5 in total height) without changing gait speed. No evidence of imbalance.    Gait with Narrow Base of Support  Ambulates less than 4 steps heel to toe or cannot perform without assistance.    Gait with Eyes Closed  Cannot walk 20 ft without assistance, severe gait deviations or imbalance, deviates greater than 15 in outside 12 in walkway width or will not attempt task.    Ambulating Backwards  Walks 20 ft, uses assistive device, slower speed, mild gait deviations, deviates 6-10 in outside 12 in walkway width.    Steps  Alternating feet, must use rail.    Total Score  17    FGA comment:  17/30         Vestibular Assessment - 04/18/18 1213      Positional Testing   Dix-Hallpike  Dix-Hallpike  Right;Dix-Hallpike Left    Horizontal Canal Testing  Horizontal Canal Right;Horizontal Canal Left      Dix-Hallpike Right   Dix-Hallpike Right Duration  0    Dix-Hallpike Right Symptoms  No nystagmus      Dix-Hallpike Left   Dix-Hallpike Left Duration  0    Dix-Hallpike Left Symptoms  No nystagmus      Horizontal Canal Right   Horizontal Canal Right Duration  0    Horizontal Canal Right Symptoms  Normal      Horizontal Canal Left   Horizontal Canal Left Duration  0    Horizontal Canal Left Symptoms  Normal                Vestibular Treatment/Exercise - 04/18/18 1227      Vestibular Treatment/Exercise   Vestibular Treatment Provided  Gaze    Gaze Exercises  X1 Viewing Horizontal;X1 Viewing Vertical      X1 Viewing Horizontal   Foot Position  unsupported sitting    Reps  1    Comments  30 seconds      X1 Viewing Vertical   Foot Position  unsupported sitting    Reps  1    Comments  30 seconds            PT Education - 04/18/18 1248    Education provided  Yes    Education Details  FGA score, falls risk, x 1 viewing in sitting    Person(s) Educated  Patient    Methods  Explanation;Handout;Demonstration    Comprehension  Verbalized understanding;Returned demonstration       PT Short Term Goals - 04/04/18 1240      PT SHORT TERM GOAL #1   Title  Pt will participate in further balance and vestibular training with assessment of FGA, SOT and MSQ.    Time  4    Period  Weeks    Status  New    Target Date  05/04/18      PT SHORT TERM GOAL #2   Title  Pt will participate in further assessment of R anterior canal (with use of frenzels) and tolerate treatment of anterior canal BPPV if indicated    Time  4    Period  Weeks    Status  New    Target Date  05/04/18      PT SHORT TERM GOAL #3   Title  Pt will improve gait velocity to >/= 3.5 ft/sec with more normal BOS    Baseline  3.2 ft/sec, ataxic    Time  4    Period  Weeks    Status  New    Target  Date  05/04/18      PT SHORT TERM GOAL #4   Title  Pt will report 25% reduction in need to touch furniture/walls when ambulating and reduction in sudden veering    Time  4    Period  Weeks    Status  New    Target Date  05/04/18        PT Long Term Goals - 04/04/18 1257      PT LONG TERM GOAL #1   Title  Pt will be independent with HEP    Time  8    Status  New    Target Date  06/03/18      PT LONG TERM GOAL #2   Title  Pt will improve gait velocity to >/= 3.8 ft/sec with more normal BOS and 50% reduction in need to touch furniture/walls and reduction in veering    Time  8    Period  Weeks    Status  New    Target Date  06/03/18      PT LONG TERM GOAL #3   Title  SOT goal TBD      PT LONG TERM GOAL #4   Title  Pt will improve FGA score by 4 points    Baseline  TBD    Time  8    Period  Weeks    Status  New    Target Date  06/03/18      PT LONG TERM GOAL #5   Title  Pt will report improvement in function on FOTO to >/= 74%    Baseline  64%    Time  8    Period  Weeks    Status  New    Target Date  06/03/18            Plan - 04/18/18 1248    Clinical Impression Statement  Continued assessment of standing balance, gait and falls risk with FGA with pt demonstrating increased falls risk with head turns, narrow BOS, stepping over obstacle, and with decreased visual input.  Goals reset.  Performed re-assessment of all semi-circular canals with use of Frenzel lenses to prevent fixation.  No downbeat nystagmus today but pt did demonstrate intermittent, mild L beating nystagmus not seen during second assessment of R posterior canal.  Pt reported constant disequilibrium and "table moving" during testing.  No indication of canalithiasis or cupulolithiasis to treat.  Initiated seated gaze adaptation for vestibular stimulation beginning with 30 seconds.  Pt demonstrated mild imbalance in unsupported sitting.  Will continue to assess and address to progress towards LTG.     Rehab Potential  Fair    Clinical Impairments Affecting Rehab Potential  uknown etiology of deficits    PT Frequency  2x / week    PT Duration  8 weeks    PT Treatment/Interventions  ADLs/Self Care Home Management;Canalith Repostioning;DME Instruction;Gait training;Stair training;Functional mobility training;Therapeutic activities;Therapeutic exercise;Balance training;Neuromuscular re-education;Patient/family education;Vestibular    PT Next Visit Plan   SOT, reset goal.  initiate HEP    Recommended Other Services  SLP?    Consulted and Agree with Plan of Care  Patient       Patient will benefit from skilled therapeutic intervention in order to improve the following deficits and impairments:  Abnormal gait, Decreased balance, Difficulty walking, Dizziness  Visit Diagnosis: Dizziness and  giddiness  Unsteadiness on feet  Ataxic gait     Problem List Patient Active Problem List   Diagnosis Date Noted  . Dizziness 03/10/2018  . Benign essential tremor 03/10/2018  . Disequilibrium 03/10/2018  . Lacunar stroke (HCC) 02/18/2018  . Dysesthesia of multiple sites 03/19/2014  . Restless legs syndrome (RLS) 03/19/2014  . Anemia associated with nutritional deficiency 03/19/2014  . Hypersomnia with sleep apnea, unspecified 03/19/2014  . Acute medial meniscus tear of left knee 10/16/2012  . Leukopenia 08/12/2012  . CALCULUS OF KIDNEY 10/06/2010  . WEIGHT LOSS 08/10/2010  . DIARRHEA 08/10/2010  . ABDOMINAL PAIN RIGHT LOWER QUADRANT 08/10/2010  . ERUCTATION 04/18/2010  . HYPERSOMNIA 01/24/2010  . 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 VENOUS INSUFFICIENCY, LEGS 08/29/2009  . HYPOTHYROIDISM 04/21/2008  . DEPRESSION 04/21/2008  . SLEEP APNEA, OBSTRUCTIVE 04/21/2008  . DEEP VENOUS THROMBOPHLEBITIS, LEFT, LEG, HX OF 04/21/2008  . WISDOM TEETH EXTRACTION, HX OF 04/21/2008   Dierdre Highman, PT, DPT 04/18/18    1:07 PM    [facility] [address] [place], [place], [address] Phone: [phone]   Fax:  [phone]  Name: KADY TOOTHAKER MRN: [id number] Date of Birth: 09-Feb-1941

## 2018-04-23 ENCOUNTER — Ambulatory Visit: Payer: Medicare Other | Admitting: Physical Therapy

## 2018-04-23 ENCOUNTER — Encounter: Payer: Self-pay | Admitting: Physical Therapy

## 2018-04-23 DIAGNOSIS — R2681 Unsteadiness on feet: Secondary | ICD-10-CM

## 2018-04-23 DIAGNOSIS — R42 Dizziness and giddiness: Secondary | ICD-10-CM

## 2018-04-23 DIAGNOSIS — R260 Ataxic gait: Secondary | ICD-10-CM

## 2018-04-23 NOTE — Patient Instructions (Addendum)
Gaze Stabilization - Tip Card  1.Target must remain in focus, not blurry, and appear stationary while head is in motion. 2.Perform exercises with small head movements (45 to either side of midline). 3.Increase speed of head motion so long as target is in focus. 4.If you wear eyeglasses, be sure you can see target through lens (therapist will give specific instructions for bifocal / progressive lenses). 5.These exercises may provoke dizziness or nausea. Work through these symptoms. If too dizzy, slow head movement slightly. Rest between each exercise. 6.Exercises demand concentration; avoid distractions. 7.For safety, perform standing exercises close to a counter, wall, corner, or next to someone.  Copyright  VHI. All rights reserved.   Gaze Stabilization - Standing Feet Apart   Feet shoulder width apart, keeping eyes on target on wall 3 feet away, tilt head down slightly and move head side to side for 45 seconds. Repeat while moving head up and down for 45 seconds. *Work up to tolerating 60 seconds, as able. Do 2-3 sessions per day.   Weight Shift: Anterior / Posterior (Righting / Equilibrium)    Standing in front of a wall.  Shift weight backward, arms forward and hips back over heels, until toes rise off floor. Hips bump wall and then return to upright.   Repeat __10__ times per session.   Perform eyes open, eyes closed.  Feet apart, feet together.  Standing on solid floor, standing on foam.  Copyright  VHI. All rights reserved.

## 2018-04-23 NOTE — Therapy (Signed)
[facility] [address] [place], [place], [address] Phone: [phone]   Fax:  [phone]  Physical Therapy Treatment  Patient Details  Name: Linda Macdonald MRN: [id number] Date of Birth: 1941/10/28 Referring Provider: Anson Fret, MD   Encounter Date: 04/23/2018  PT End of Session - 04/23/18 1423    Visit Number  3    Number of Visits  17    Date for PT Re-Evaluation  06/03/18    Authorization Type  [employer] Medicare; VL: [place]    PT Start Time  1320    PT Stop Time  1412    PT Time Calculation (min)  52 min    Activity Tolerance  Patient tolerated treatment well    Behavior During Therapy  [facility] for tasks assessed/performed       Past Medical History:  Diagnosis Date  . Arthritis   . Chronic kidney disease   . Depression   . Leukopenia 08/12/2012  . Thyroid disease     Past Surgical History:  Procedure Laterality Date  . EXTRACORPOREAL SHOCK WAVE LITHOTRIPSY Left 04/25/2017   Procedure: LEFT EXTRACORPOREAL SHOCK WAVE LITHOTRIPSY (ESWL);  Surgeon: Heloise Purpura, MD;  Location: [facility];  Service: Urology;  Laterality: Left;  . KNEE ARTHROSCOPY Left 2oyrs ago   L knee x2/ R knee x1  . LITHOTRIPSY  2012   difficulty voiding after procedure, d/c home with foley per pt  . TUBAL LIGATION    . WISDOM TOOTH EXTRACTION     teenager    There were no vitals filed for this visit.  Subjective Assessment - 04/23/18 1324    Subjective  Feeling about the same.  No dizziness after performing eye exercise.      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  Did show chronic lacunar basal ganglia infarct vs. expanding virchow robin space.  Normal nerve conduction study/EMG.    Patient  Stated Goals  To improve balance     Currently in Pain?  No/denies             Vestibular Assessment - 04/23/18 1325      Balancemaster   Curator Comment  composite score of 59       Sensory Organization Testing=59% compared to age/height normative value of 65%.   Patient demonstrated [facility] use of somatosensory feedback for balance, WFL use of visual feedback for balance, and below normal limits (17%) use of vestibular feedback for balance.            Vestibular Treatment/Exercise - 04/23/18 1352      Vestibular Treatment/Exercise   Vestibular Treatment Provided  Gaze    Gaze Exercises  X1 Viewing Horizontal;X1 Viewing Vertical      X1 Viewing Horizontal   Foot Position  standing feet apart    Reps  2    Comments  30 seconds and then 60 seconds, decreasing to 45 seconds.  Progressed from UE support to no UE support      X1 Viewing Vertical   Foot Position  standing feet apart    Reps  2    Comments  30 seconds and then 60 seconds, decreasing to 45 seconds.  Progressed from UE support to no UE support         Balance Exercises - 04/23/18 1407      Balance Exercises: Standing   Wall Bumps  Hip    Wall Bumps-Hips  Eyes opened;Eyes closed;Anterior/posterior;10 reps        PT Education - 04/23/18 1422    Education provided  Yes    Education Details  SOT results and role of each sensory system, focus of balance training, progressed HEP    Person(s) Educated  Patient    Methods  Explanation;Demonstration;Handout    Comprehension  Verbalized understanding;Returned demonstration     Gaze Stabilization - Tip Card  1.Target must remain in focus, not blurry, and appear stationary while head is in motion. 2.Perform exercises with small head movements (45 to either side of midline). 3.Increase speed of head motion so long as target is in focus. 4.If you wear eyeglasses, be sure you can see target through lens  (therapist will give specific instructions for bifocal / progressive lenses). 5.These exercises may provoke dizziness or nausea. Work through these symptoms. If too dizzy, slow head movement slightly. Rest between each exercise. 6.Exercises demand concentration; avoid distractions. 7.For safety, perform standing exercises close to a counter, wall, corner, or next to someone.  Copyright  VHI. All rights reserved.   Gaze Stabilization - Standing Feet Apart   Feet shoulder width apart, keeping eyes on target on wall 3 feet away, tilt head down slightly and move head side to side for 45 seconds. Repeat while moving head up and down for 45 seconds. *Work up to tolerating 60 seconds, as able. Do 2-3 sessions per day.   Weight Shift: Anterior / Posterior (Righting / Equilibrium)    Standing in front of a wall.  Shift weight backward, arms forward and hips back over heels, until toes rise off floor. Hips bump wall and then return to upright.   Repeat __10__ times per session.   Perform eyes open, eyes closed.  Feet apart, feet together.  Standing on solid floor, standing on foam.  Copyright  VHI. All rights reserved.      PT Short Term Goals - 04/23/18 2038      PT SHORT TERM GOAL #1   Title  Pt will participate in further balance and vestibular training with assessment of FGA, SOT and MSQ.    Time  4    Period  Weeks    Status  Achieved      PT SHORT TERM GOAL #2   Title  Pt will participate in further assessment of R anterior canal (with use of frenzels) and tolerate treatment of anterior canal BPPV if indicated    Time  4    Period  Weeks    Status  Achieved      PT SHORT TERM GOAL #3   Title  Pt will improve gait velocity to >/= 3.5 ft/sec with more normal BOS    Baseline  3.2 ft/sec, ataxic    Time  4    Period  Weeks    Status  New      PT SHORT TERM GOAL #4   Title  Pt will report 25% reduction in need to touch furniture/walls when ambulating and reduction in sudden  veering    Time  4    Period  Weeks    Status  New        PT Long Term Goals - 04/23/18 2039      PT  LONG TERM GOAL #1   Title  Pt will be independent with HEP    Time  8    Status  New    Target Date  06/03/18      PT LONG TERM GOAL #2   Title  Pt will improve gait velocity to >/= 3.8 ft/sec with more normal BOS and 50% reduction in need to touch furniture/walls and reduction in veering    Time  8    Period  Weeks    Status  New    Target Date  06/03/18      PT LONG TERM GOAL #3   Title  Will improve SOT composite score by >/= 10 points with improved utilization of vestibular system    Baseline  59 composite score    Time  8    Period  Weeks    Status  Revised    Target Date  06/03/18      PT LONG TERM GOAL #4   Title  Pt will improve FGA score by 4 points    Baseline  17/30    Time  8    Period  Weeks    Status  Revised    Target Date  06/03/18      PT LONG TERM GOAL #5   Title  Pt will report improvement in function on FOTO to >/= 74%    Baseline  64%    Time  8    Period  Weeks    Status  New    Target Date  06/03/18            Plan - 04/23/18 1423    Clinical Impression Statement  Continued assessment of sensory systems involved in balance with SOT on Balance Master.  See results above.  Educated pt on impaired use of vestibular system and dependence on sensory and visual information to maintain balance.  Initiated progression of x 1 viewing to standing and increased time to 60 seconds.  Also initiated hip strategy balance reaction training with wall bumps varying support surface and visual input.  Pt tolerated well but required ongoing education on the purpose of VOR and gaze adaptation exercises.  Will continue to progress towards LTG.    Rehab Potential  Fair    Clinical Impairments Affecting Rehab Potential  uknown etiology of deficits    PT Frequency  2x / week    PT Duration  8 weeks    PT Treatment/Interventions  ADLs/Self Care Home  Management;Canalith Repostioning;DME Instruction;Gait training;Stair training;Functional mobility training;Therapeutic activities;Therapeutic exercise;Balance training;Neuromuscular re-education;Patient/family education;Vestibular    PT Next Visit Plan  Continue to add to HEP; progressing x 1 viewing, gait without veering, balance removing sensory and vision to strengthen vestibular    Recommended Other Services  Pt continues to ask about SLP     Consulted and Agree with Plan of Care  Patient       Patient will benefit from skilled therapeutic intervention in order to improve the following deficits and impairments:  Abnormal gait, Decreased balance, Difficulty walking, Dizziness  Visit Diagnosis: Dizziness and giddiness  Unsteadiness on feet  Ataxic gait     Problem List Patient Active Problem List   Diagnosis Date Noted  . Dizziness 03/10/2018  . Benign essential tremor 03/10/2018  . Disequilibrium 03/10/2018  . Lacunar stroke (HCC) 02/18/2018  . Dysesthesia of multiple sites 03/19/2014  . Restless legs syndrome (RLS) 03/19/2014  . Anemia associated with nutritional deficiency 03/19/2014  . Hypersomnia with sleep  apnea, unspecified 03/19/2014  . Acute medial meniscus tear of left knee 10/16/2012  . Leukopenia 08/12/2012  . CALCULUS OF KIDNEY 10/06/2010  . WEIGHT LOSS 08/10/2010  . DIARRHEA 08/10/2010  . ABDOMINAL PAIN RIGHT LOWER QUADRANT 08/10/2010  . ERUCTATION 04/18/2010  . HYPERSOMNIA 01/24/2010  . 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 VENOUS INSUFFICIENCY, LEGS 08/29/2009  . HYPOTHYROIDISM 04/21/2008  . DEPRESSION 04/21/2008  . SLEEP APNEA, OBSTRUCTIVE 04/21/2008  . DEEP VENOUS THROMBOPHLEBITIS, LEFT, LEG, HX OF 04/21/2008  . WISDOM TEETH EXTRACTION, HX OF 04/21/2008   Dierdre Highman, PT, DPT 04/23/18    8:43 PM    [facility] [address] [place], [place], [address] Phone: [phone]    Fax:  [phone]  Name: LYNDIE VANDERLOOP MRN: [id number] Date of Birth: 1941-01-30

## 2018-04-25 ENCOUNTER — Encounter: Payer: Self-pay | Admitting: Physical Therapy

## 2018-04-25 ENCOUNTER — Ambulatory Visit: Payer: Medicare Other | Admitting: Physical Therapy

## 2018-04-25 VITALS — BP 117/80 | HR 61

## 2018-04-25 DIAGNOSIS — R42 Dizziness and giddiness: Secondary | ICD-10-CM

## 2018-04-25 DIAGNOSIS — R2681 Unsteadiness on feet: Secondary | ICD-10-CM | POA: Diagnosis not present

## 2018-04-25 DIAGNOSIS — R260 Ataxic gait: Secondary | ICD-10-CM | POA: Diagnosis not present

## 2018-04-25 NOTE — Therapy (Signed)
[facility] [address] [place], [place], [address] Phone: [phone]   Fax:  [phone]  Physical Therapy Treatment  Patient Details  Name: Linda Macdonald MRN: [id number] Date of Birth: 1941/07/28 Referring Provider: Anson Fret, MD   Encounter Date: 04/25/2018  PT End of Session - 04/25/18 1411    Visit Number  4    Number of Visits  17    Date for PT Re-Evaluation  06/03/18    Authorization Type  [employer]; [place]: [place]    PT Start Time  1320    PT Stop Time  1406    PT Time Calculation (min)  46 min    Activity Tolerance  Patient tolerated treatment well    Behavior During Therapy  [facility] for tasks assessed/performed       Past Medical History:  Diagnosis Date  . Arthritis   . Chronic kidney disease   . Depression   . Leukopenia 08/12/2012  . Thyroid disease     Past Surgical History:  Procedure Laterality Date  . EXTRACORPOREAL SHOCK WAVE LITHOTRIPSY Left 04/25/2017   Procedure: LEFT EXTRACORPOREAL SHOCK WAVE LITHOTRIPSY (ESWL);  Surgeon: Heloise Purpura, MD;  Location: [facility];  Service: Urology;  Laterality: Left;  . KNEE ARTHROSCOPY Left 2oyrs ago   L knee x2/ R knee x1  . LITHOTRIPSY  2012   difficulty voiding after procedure, d/c home with foley per pt  . TUBAL LIGATION    . WISDOM TOOTH EXTRACTION     teenager    Vitals:   04/25/18 1325  BP: 117/80  Pulse: 61    Subjective Assessment - 04/25/18 1327    Subjective  Feeling more off balance today and increased tremors.  Woke up feeling worse.  Took PRN beta blocker for tremors about an hour ago.  Denies nausea or vomiting, headache or changes in vision.  Does report having increased difficulty with speech today.    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  Did show chronic lacunar basal ganglia infarct vs. expanding virchow robin space.  Normal nerve conduction study/EMG.    Patient Stated Goals  To improve balance     Currently in Pain?  No/denies             Vestibular Assessment - 04/25/18 1330      Occulomotor Exam   Occulomotor Alignment  Normal    Spontaneous  Absent    Gaze-induced  Absent    Smooth Pursuits  Saccades    Saccades  Slow;Poor trajectory with vertical saccades    Comment  Convergence impaired      Vestibulo-Occular Reflex   VOR to Slow Head Movement  Normal    VOR Cancellation  Normal    Comment  + bilaterally today      Positional Testing   Dix-Hallpike  Dix-Hallpike Right;Dix-Hallpike Left    Horizontal Canal Testing  Horizontal Canal Right;Horizontal Canal Left      Dix-Hallpike Right   Dix-Hallpike Right Duration  0    Dix-Hallpike Right Symptoms  No nystagmus      Dix-Hallpike Left   Dix-Hallpike Left Duration  0    Dix-Hallpike Left Symptoms  No nystagmus      Horizontal  Canal Right   Horizontal Canal Right Duration  0    Horizontal Canal Right Symptoms  Other (comment) appears to have eye tremors, no fast<>slow phase      Horizontal Canal Left   Horizontal Canal Left Duration  0    Horizontal Canal Left Symptoms  Other (comment) appears to have eye tremors, no fast<>slow phase                    Balance Exercises - 04/25/18 1408      Balance Exercises: Standing   Rockerboard  Anterior/posterior;Head turns;EO;EC;10 seconds;10 reps;Intermittent UE support    Other Standing Exercises  On rockerboard performed EO: feet apart, staggered stance R/L foot forwards performing anterior/posterior weight shifts, ankle and hip strategies.  Feet apart pt performed with EO head turns and head nods x 10; performed static balance with EC x 10 seconds x 3 reps        PT Education - 04/25/18 1410    Education provided  Yes     Education Details  continued to review purpose of vestibular system for VOR and balance, rationale for multi-sensory balance treatment interventions, continue x 1 viewing and wall bumps this weekend    Person(s) Educated  Patient    Methods  Explanation;Demonstration    Comprehension  Need further instruction       PT Short Term Goals - 04/23/18 2038      PT SHORT TERM GOAL #1   Title  Pt will participate in further balance and vestibular training with assessment of FGA, SOT and MSQ.    Time  4    Period  Weeks    Status  Achieved      PT SHORT TERM GOAL #2   Title  Pt will participate in further assessment of R anterior canal (with use of frenzels) and tolerate treatment of anterior canal BPPV if indicated    Time  4    Period  Weeks    Status  Achieved      PT SHORT TERM GOAL #3   Title  Pt will improve gait velocity to >/= 3.5 ft/sec with more normal BOS    Baseline  3.2 ft/sec, ataxic    Time  4    Period  Weeks    Status  New      PT SHORT TERM GOAL #4   Title  Pt will report 25% reduction in need to touch furniture/walls when ambulating and reduction in sudden veering    Time  4    Period  Weeks    Status  New        PT Long Term Goals - 04/23/18 2039      PT LONG TERM GOAL #1   Title  Pt will be independent with HEP    Time  8    Status  New    Target Date  06/03/18      PT LONG TERM GOAL #2   Title  Pt will improve gait velocity to >/= 3.8 ft/sec with more normal BOS and 50% reduction in need to touch furniture/walls and reduction in veering    Time  8    Period  Weeks    Status  New    Target Date  06/03/18      PT LONG TERM GOAL #3   Title  Will improve SOT composite score by >/= 10 points with improved utilization of vestibular system    Baseline  59 composite score  Time  8    Period  Weeks    Status  Revised    Target Date  06/03/18      PT LONG TERM GOAL #4   Title  Pt will improve FGA score by 4 points    Baseline  17/30    Time  8     Period  Weeks    Status  Revised    Target Date  06/03/18      PT LONG TERM GOAL #5   Title  Pt will report improvement in function on FOTO to >/= 74%    Baseline  64%    Time  8    Period  Weeks    Status  New    Target Date  06/03/18            Plan - 04/25/18 1412    Clinical Impression Statement  Due to pt reporting increased feelings of disequilibrium, veering and tremors today performed re-assessment of oculomotor, VOR and peripheral canals.  Only change observed today is + for refixation saccade during HIT to L and R indicating bilat hypofunction.  Pt encouraged to continue x1 viewing exercises this weekend and will re-assess progress with gaze adaptation at next visit.  Continued multi-sensory balance training on unstable surface performing weight shifting and balance reaction training with and then without visual input.  Pt tolerated well but continued to feel very off balance after treatment; pt continues to ask the same question each session "what does the vestibular system do?" and asking about the purpose of vestibular and balance exercises; continued to re-educate pt on purpose of vestibular system for VOR and postural control.  Will continue to review and progress towards LTG.    Rehab Potential  Fair    Clinical Impairments Affecting Rehab Potential  uknown etiology of deficits    PT Frequency  2x / week    PT Duration  8 weeks    PT Treatment/Interventions  ADLs/Self Care Home Management;Canalith Repostioning;DME Instruction;Gait training;Stair training;Functional mobility training;Therapeutic activities;Therapeutic exercise;Balance training;Neuromuscular re-education;Patient/family education;Vestibular    PT Next Visit Plan  STG due by 5/26.  Continue to add to HEP; progressing x 1 viewing, gait without veering, balance removing sensory and vision to strengthen vestibular, rockerboard    Consulted and Agree with Plan of Care  Patient       Patient will benefit from  skilled therapeutic intervention in order to improve the following deficits and impairments:  Abnormal gait, Decreased balance, Difficulty walking, Dizziness  Visit Diagnosis: Dizziness and giddiness  Unsteadiness on feet  Ataxic gait     Problem List Patient Active Problem List   Diagnosis Date Noted  . Dizziness 03/10/2018  . Benign essential tremor 03/10/2018  . Disequilibrium 03/10/2018  . Lacunar stroke (HCC) 02/18/2018  . Dysesthesia of multiple sites 03/19/2014  . Restless legs syndrome (RLS) 03/19/2014  . Anemia associated with nutritional deficiency 03/19/2014  . Hypersomnia with sleep apnea, unspecified 03/19/2014  . Acute medial meniscus tear of left knee 10/16/2012  . Leukopenia 08/12/2012  . CALCULUS OF KIDNEY 10/06/2010  . WEIGHT LOSS 08/10/2010  . DIARRHEA 08/10/2010  . ABDOMINAL PAIN RIGHT LOWER QUADRANT 08/10/2010  . ERUCTATION 04/18/2010  . HYPERSOMNIA 01/24/2010  . 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 VENOUS INSUFFICIENCY, LEGS 08/29/2009  . HYPOTHYROIDISM 04/21/2008  . DEPRESSION 04/21/2008  . SLEEP APNEA, OBSTRUCTIVE 04/21/2008  . DEEP VENOUS THROMBOPHLEBITIS, LEFT, LEG, HX OF 04/21/2008  . WISDOM TEETH EXTRACTION, HX OF 04/21/2008  Dierdre Highman, PT, DPT 04/25/18    2:19 PM    [facility] [address] [place], [place], [address] Phone: [phone]   Fax:  [phone]  Name: KINGSLEE DOWSE MRN: [id number] Date of Birth: Apr 29, 1941

## 2018-04-28 DIAGNOSIS — K210 Gastro-esophageal reflux disease with esophagitis: Secondary | ICD-10-CM | POA: Diagnosis not present

## 2018-04-28 DIAGNOSIS — R1310 Dysphagia, unspecified: Secondary | ICD-10-CM | POA: Diagnosis not present

## 2018-04-29 ENCOUNTER — Encounter: Payer: Self-pay | Admitting: Physical Therapy

## 2018-04-29 ENCOUNTER — Ambulatory Visit: Payer: Medicare Other | Admitting: Physical Therapy

## 2018-04-29 DIAGNOSIS — R42 Dizziness and giddiness: Secondary | ICD-10-CM | POA: Diagnosis not present

## 2018-04-29 DIAGNOSIS — R260 Ataxic gait: Secondary | ICD-10-CM

## 2018-04-29 DIAGNOSIS — R2681 Unsteadiness on feet: Secondary | ICD-10-CM

## 2018-04-29 NOTE — Patient Instructions (Addendum)
FOR EYE/LETTER EXERCISE:    1) FEET APART - increase to 60 seconds   2) FEET CLOSE TOGETHER - start at 45 seconds, work up to 60 seconds.  Work towards Industrial/product designer   FOR WALL BUMPS:  To make more challenging: perform with feet together - eyes open and eyes closed.  Perform standing on a pillow - feet apart, feet together, eyes open and eyes closed    Single Leg - Eyes Open    Holding support, lift right leg while maintaining balance over other leg. Progress to removing hands from support surface for longer periods of time. Hold__10-12__ seconds. Repeat __2-3__ times per leg.  ALSO PRACTICE LOOKING UP/DOWN, SIDE TO SIDE WHILE STANDING ON ONE LEG   Feet Partial Heel-Toe, Head Motion - Eyes Open    With eyes open, right foot partially in front of the other, move head slowly: up and down 10 times, side to side 10 times.  Switch feet and repeat   Feet Together (Compliant Surface) Head Motion - Eyes Open    With eyes open, standing on compliant surface: pillow, feet together, move head slowly: up and down 10 times, side to side 10 times. Repeat 2 times per session.      Backward    Walk FORWARDS AND backwards with eyes OPEN, HAND CLOSE TO THE WALL TO PREVENT VEERING. Take even steps, making sure each foot lifts off floor. Repeat 4 times.

## 2018-04-29 NOTE — Therapy (Signed)
[facility] [address] [place], [place], [address] Phone: [phone]   Fax:  [phone]  Physical Therapy Treatment  Patient Details  Name: Linda Macdonald MRN: [id number] Date of Birth: 1941/06/04 Referring Provider: Anson Fret, MD   Encounter Date: 04/29/2018  PT End of Session - 04/29/18 1306    Visit Number  5    Number of Visits  17    Date for PT Re-Evaluation  06/03/18    Authorization Type  [employer] Medicare; VL: [place]    PT Start Time  1017    PT Stop Time  1100    PT Time Calculation (min)  43 min    Activity Tolerance  Patient tolerated treatment well    Behavior During Therapy  [facility] for tasks assessed/performed       Past Medical History:  Diagnosis Date  . Arthritis   . Chronic kidney disease   . Depression   . Leukopenia 08/12/2012  . Thyroid disease     Past Surgical History:  Procedure Laterality Date  . EXTRACORPOREAL SHOCK WAVE LITHOTRIPSY Left 04/25/2017   Procedure: LEFT EXTRACORPOREAL SHOCK WAVE LITHOTRIPSY (ESWL);  Surgeon: Heloise Purpura, MD;  Location: [facility];  Service: Urology;  Laterality: Left;  . KNEE ARTHROSCOPY Left 2oyrs ago   L knee x2/ R knee x1  . LITHOTRIPSY  2012   difficulty voiding after procedure, d/c home with foley per pt  . TUBAL LIGATION    . WISDOM TOOTH EXTRACTION     teenager    There were no vitals filed for this visit.  Subjective Assessment - 04/29/18 1023    Subjective  Feeling more balanced today; went to the gym to work out with a trainer this morning, noticed less veering with gait.  Has been doing home exercises and notices increased strength in feet.    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  Did show  chronic lacunar basal ganglia infarct vs. expanding virchow robin space.  Normal nerve conduction study/EMG.    Patient Stated Goals  To improve balance     Currently in Pain?  No/denies             Vestibular Assessment - 04/29/18 1024      Positional Sensitivities   Nose to Right Knee  No dizziness    Right Knee to Sitting  No dizziness    Nose to Left Knee  No dizziness    Left Knee to Sitting  Lightheadedness    Head Turning x 5  Lightheadedness    Head Nodding x 5  Lightheadedness    Pivot Right in Standing  Lightheadedness    Pivot Left in Standing  Lightheadedness    Positional Sensitivities Comments  reports a slight lag in visual fixation                Vestibular Treatment/Exercise - 04/29/18 1028      Vestibular Treatment/Exercise   Vestibular Treatment Provided  Gaze    Gaze Exercises  X1 Viewing Horizontal;X1 Viewing Vertical      X1 Viewing Horizontal   Foot Position  feet apart, feet together    Reps  2    Comments  45-60 seconds  X1 Viewing Vertical   Foot Position  feet apart, feet together    Reps  2    Comments  45-60 seconds         Balance Exercises - 04/29/18 1303      Balance Exercises: Standing   Standing Eyes Opened  Narrow base of support (BOS);Head turns;Foam/compliant surface;Other reps (comment) 10 reps head turns/nods, feet together    Tandem Stance  Eyes open;Intermittent upper extremity support;Other reps (comment);30 secs 10 reps head nods/turns each foot forwards    SLS  Eyes open;Solid surface;Upper extremity support 1;3 reps;10 secs    Other Standing Exercises  Walking forwards and backwards in hallway with EO, EC with one finger touching wall for proprioceptive feedback for position and prevent veering.  4 reps with supervision-min A with EC       FOR EYE/LETTER EXERCISE:    1) FEET APART - increase to 60 seconds   2) FEET CLOSE TOGETHER - start at 45 seconds, work up to 60 seconds.  Work towards Statistician   FOR WALL BUMPS:  To make more challenging: perform with feet together - eyes open and eyes closed.  Perform standing on a pillow - feet apart, feet together, eyes open and eyes closed    Single Leg - Eyes Open    Holding support, lift right leg while maintaining balance over other leg. Progress to removing hands from support surface for longer periods of time. Hold__10-12__ seconds. Repeat __2-3__ times per leg.  ALSO PRACTICE LOOKING UP/DOWN, SIDE TO SIDE WHILE STANDING ON ONE LEG   Feet Partial Heel-Toe, Head Motion - Eyes Open    With eyes open, right foot partially in front of the other, move head slowly: up and down 10 times, side to side 10 times.  Switch feet and repeat   Feet Together (Compliant Surface) Head Motion - Eyes Open    With eyes open, standing on compliant surface: pillow, feet together, move head slowly: up and down 10 times, side to side 10 times. Repeat 2 times per session.      Backward    Walk FORWARDS AND backwards with eyes OPEN, HAND CLOSE TO THE WALL TO PREVENT VEERING. Take even steps, making sure each foot lifts off floor. Repeat 4 times.    PT Education - 04/29/18 1305    Education provided  Yes    Education Details  added to and progressed HEP, continued to educate on purpose of VOR for balance    Person(s) Educated  Patient    Methods  Explanation;Demonstration;Handout    Comprehension  Verbalized understanding;Returned demonstration       PT Short Term Goals - 04/23/18 2038      PT SHORT TERM GOAL #1   Title  Pt will participate in further balance and vestibular training with assessment of FGA, SOT and MSQ.    Time  4    Period  Weeks    Status  Achieved      PT SHORT TERM GOAL #2   Title  Pt will participate in further assessment of R anterior canal (with use of frenzels) and tolerate treatment of anterior canal BPPV if indicated    Time  4    Period  Weeks    Status  Achieved      PT SHORT TERM  GOAL #3   Title  Pt will improve gait velocity to >/= 3.5 ft/sec with more normal BOS    Baseline  3.2 ft/sec, ataxic  Time  4    Period  Weeks    Status  New      PT SHORT TERM GOAL #4   Title  Pt will report 25% reduction in need to touch furniture/walls when ambulating and reduction in sudden veering    Time  4    Period  Weeks    Status  New        PT Long Term Goals - 04/23/18 2039      PT LONG TERM GOAL #1   Title  Pt will be independent with HEP    Time  8    Status  New    Target Date  06/03/18      PT LONG TERM GOAL #2   Title  Pt will improve gait velocity to >/= 3.8 ft/sec with more normal BOS and 50% reduction in need to touch furniture/walls and reduction in veering    Time  8    Period  Weeks    Status  New    Target Date  06/03/18      PT LONG TERM GOAL #3   Title  Will improve SOT composite score by >/= 10 points with improved utilization of vestibular system    Baseline  59 composite score    Time  8    Period  Weeks    Status  Revised    Target Date  06/03/18      PT LONG TERM GOAL #4   Title  Pt will improve FGA score by 4 points    Baseline  17/30    Time  8    Period  Weeks    Status  Revised    Target Date  06/03/18      PT LONG TERM GOAL #5   Title  Pt will report improvement in function on FOTO to >/= 74%    Baseline  64%    Time  8    Period  Weeks    Status  New    Target Date  06/03/18            Plan - 04/29/18 1306    Clinical Impression Statement  Treatment session today focused on assessment of MSQ to determine most provocative movements in order to initiate habituation.  Pt reported only mild "disorientation" following most movements.  Continued to progress x 1 gaze adaptation training and added to patient's balance HEP with multi-sensory balance training in the corner and walking down hallway.  Pt continues to ask about the purpose of Vestibular system and exercises for VOR: continued to re-educate pt -pt may benefit  from handout on VOR due to impaired recall of previous information.  Will continue to address to progress towards LTG.    Rehab Potential  Fair    Clinical Impairments Affecting Rehab Potential  uknown etiology of deficits    PT Frequency  2x / week    PT Duration  8 weeks    PT Treatment/Interventions  ADLs/Self Care Home Management;Canalith Repostioning;DME Instruction;Gait training;Stair training;Functional mobility training;Therapeutic activities;Therapeutic exercise;Balance training;Neuromuscular re-education;Patient/family education;Vestibular    PT Next Visit Plan  STG due by 5/26.  Continue to progress HEP and x 1 viewing, gait without veering, balance removing sensory and vision to strengthen vestibular, rockerboard    Consulted and Agree with Plan of Care  Patient       Patient will benefit from skilled therapeutic intervention in order to improve the following deficits and impairments:  Abnormal gait, Decreased balance,  Difficulty walking, Dizziness  Visit Diagnosis: Dizziness and giddiness  Unsteadiness on feet  Ataxic gait     Problem List Patient Active Problem List   Diagnosis Date Noted  . Dizziness 03/10/2018  . Benign essential tremor 03/10/2018  . Disequilibrium 03/10/2018  . Lacunar stroke (HCC) 02/18/2018  . Dysesthesia of multiple sites 03/19/2014  . Restless legs syndrome (RLS) 03/19/2014  . Anemia associated with nutritional deficiency 03/19/2014  . Hypersomnia with sleep apnea, unspecified 03/19/2014  . Acute medial meniscus tear of left knee 10/16/2012  . Leukopenia 08/12/2012  . CALCULUS OF KIDNEY 10/06/2010  . WEIGHT LOSS 08/10/2010  . DIARRHEA 08/10/2010  . ABDOMINAL PAIN RIGHT LOWER QUADRANT 08/10/2010  . ERUCTATION 04/18/2010  . HYPERSOMNIA 01/24/2010  . 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 VENOUS INSUFFICIENCY, LEGS 08/29/2009  . HYPOTHYROIDISM 04/21/2008  . DEPRESSION 04/21/2008  . SLEEP APNEA, OBSTRUCTIVE 04/21/2008  . DEEP  VENOUS THROMBOPHLEBITIS, LEFT, LEG, HX OF 04/21/2008  . WISDOM TEETH EXTRACTION, HX OF 04/21/2008   Dierdre Highman, PT, DPT 04/29/18    1:12 PM    [facility] [address] [place], [place], [address] Phone: [phone]   Fax:  [phone]  Name: Linda Macdonald MRN: [id number] Date of Birth: May 22, 1941

## 2018-05-01 ENCOUNTER — Ambulatory Visit: Payer: Medicare Other | Admitting: Rehabilitative and Restorative Service Providers"

## 2018-05-02 ENCOUNTER — Ambulatory Visit: Payer: Medicare Other | Admitting: Rehabilitative and Restorative Service Providers"

## 2018-05-02 ENCOUNTER — Encounter: Payer: Self-pay | Admitting: Rehabilitative and Restorative Service Providers"

## 2018-05-02 DIAGNOSIS — R2681 Unsteadiness on feet: Secondary | ICD-10-CM

## 2018-05-02 DIAGNOSIS — R42 Dizziness and giddiness: Secondary | ICD-10-CM

## 2018-05-02 DIAGNOSIS — R260 Ataxic gait: Secondary | ICD-10-CM

## 2018-05-02 NOTE — Therapy (Signed)
[facility] [address] [place], [place], [address] Phone: [phone]   Fax:  [phone]  Physical Therapy Treatment  Patient Details  Name: Linda Macdonald MRN: [id number] Date of Birth: Nov 19, 1941 Referring Provider: Melvenia Beam, MD   Encounter Date: 05/02/2018  PT End of Session - 05/02/18 1032    Visit Number  6    Number of Visits  17    Date for PT Re-Evaluation  06/03/18    Authorization Type  [employer]; [place]: [place]    PT Start Time  0939    PT Stop Time  1020    PT Time Calculation (min)  41 min    Equipment Utilized During Treatment  Gait belt    Activity Tolerance  Patient tolerated treatment well    Behavior During Therapy  [facility] for tasks assessed/performed       Past Medical History:  Diagnosis Date  . Arthritis   . Chronic kidney disease   . Depression   . Leukopenia 08/12/2012  . Thyroid disease     Past Surgical History:  Procedure Laterality Date  . EXTRACORPOREAL SHOCK WAVE LITHOTRIPSY Left 04/25/2017   Procedure: LEFT EXTRACORPOREAL SHOCK WAVE LITHOTRIPSY (ESWL);  Surgeon: Raynelle Bring, MD;  Location: [facility];  Service: Urology;  Laterality: Left;  . KNEE ARTHROSCOPY Left 2oyrs ago   L knee x2/ R knee x1  . LITHOTRIPSY  2012   difficulty voiding after procedure, d/c home with foley per pt  . TUBAL LIGATION    . WISDOM TOOTH EXTRACTION     teenager    There were no vitals filed for this visit.  Subjective Assessment - 05/02/18 0941    Subjective  "I can still tell that my eyes and head don't move together".  She notes she has had double vision x 6 years.  Patient asked PT to look at name of issue found on CT scan (Virchow-robins space).     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  Did show chronic lacunar basal ganglia infarct vs. expanding virchow robin space.  Normal nerve conduction study/EMG.    Patient Stated Goals  To improve balance     Currently in Pain?  No/denies                       [facility] Adult PT Treatment/Exercise - 05/02/18 1012      Ambulation/Gait   Ambulation/Gait  Yes    Ambulation/Gait Assistance  4: Min guard    Ambulation Distance (Feet)  300 Feet    Assistive device  None    Gait Pattern  Step-through pattern;Narrow base of support;Ataxic;Scissoring    Ambulation Surface  Level;Indoor    Gait Comments  Gait activities discussing/demonstrating in mirror narrow base of support to patient.  Discussed emphasizing gluteus medius strengthening and focusing on small widening of stance to improve stability.  Performed gait with vertical ball toss, gait with horizontal head motion every 4 steps x 100 ft, gait with vertical head motion every 3 steps x 100 ft, backewards walking.      Self-Care   Self-Care  Other Self-Care Comments    Other Self-Care Comments   The patient inquires about mechanism that could lead to visual blurring  with head motion.  Discussed VOR and how it impacts visual fixation.      Neuro Re-ed    Neuro Re-ed Details   Compliant surface standing, turning with eyes/head to fixate on target before moving body 1/4 turns.  Added marching to 1/4 turns for greater challenge iwth SBA for safety.  Compliant surface standing narrowing base of support performing with eyes closed x 30 seconds with intermittent R leaning with CGA to min A.  Performed compliant surface alternating foot taps to ground and return to mat (blue mat doubled over for greater compliance).  Marching in place on compliant surfaces.  Standing moving ball R<>L for quick horizontal head motion.        Exercises   Exercises  Other Exercises    Other Exercises   sidestepping encouraging larger steps for hip abductor strengthening.       Vestibular Treatment/Exercise - 05/02/18 1012      Vestibular Treatment/Exercise   Vestibular Treatment Provided  Gaze    Gaze Exercises  X1 Viewing Horizontal;X1 Viewing Vertical      X1 Viewing Horizontal   Foot Position  feet apart    Reps  2    Comments  45 seconds *emphasized using letter in home as patient is using knob on cabinet.  Discussed need for visual fixation.      X1 Viewing Vertical   Foot Position  feet apart    Comments  45 seconds, patient notes widening of lower portion of A possibly due to progressive lenses and vertical diplopia            PT Education - 05/02/18 1019    Education provided  Yes    Education Details  HEP: side step added to current HEP; reviewed VOR    Person(s) Educated  Patient    Methods  Explanation;Handout;Demonstration    Comprehension  Verbalized understanding;Returned demonstration       PT Short Term Goals - 05/02/18 0950      PT SHORT TERM GOAL #1   Title  Pt will participate in further balance and vestibular training with assessment of FGA, SOT and MSQ.    Time  4    Period  Weeks    Status  Achieved      PT SHORT TERM GOAL #2   Title  Pt will participate in further assessment of R anterior canal (with use of frenzels) and tolerate treatment of anterior canal BPPV if indicated    Time  4    Period  Weeks    Status  Achieved      PT SHORT TERM GOAL #3   Title  Pt will improve gait velocity to >/= 3.5 ft/sec with more normal BOS    Baseline  3.2 ft/sec, ataxic; on 05/02/18 3.70 ft/sec.    Time  4    Period  Weeks    Status  Achieved      PT SHORT TERM GOAL #4   Title  Pt will report 25% reduction in need to touch furniture/walls when ambulating and reduction in sudden veering    Baseline  15% improvement.    Time  4    Period  Weeks    Status  Partially Met        PT Long Term Goals - 04/23/18 2039      PT LONG TERM GOAL #1   Title  Pt will be independent with HEP    Time  8    Status  New  Target  Date  06/03/18      PT LONG TERM GOAL #2   Title  Pt will improve gait velocity to >/= 3.8 ft/sec with more normal BOS and 50% reduction in need to touch furniture/walls and reduction in veering    Time  8    Period  Weeks    Status  New    Target Date  06/03/18      PT LONG TERM GOAL #3   Title  Will improve SOT composite score by >/= 10 points with improved utilization of vestibular system    Baseline  59 composite score    Time  8    Period  Weeks    Status  Revised    Target Date  06/03/18      PT LONG TERM GOAL #4   Title  Pt will improve FGA score by 4 points    Baseline  17/30    Time  8    Period  Weeks    Status  Revised    Target Date  06/03/18      PT LONG TERM GOAL #5   Title  Pt will report improvement in function on FOTO to >/= 74%    Baseline  64%    Time  8    Period  Weeks    Status  New    Target Date  06/03/18            Plan - 05/02/18 1033    Clinical Impression Statement  The patient has met 3/4 STGs and partially met 1 STG.  She notes less reliance on walls in the home.  The patient has mild trendelenberg noted with marching and has a narrow base of support with gait.  PT added hip abductor strengthening to current HEP.  Plan to continue working to [place].     PT Treatment/Interventions  ADLs/Self Care Home Management;Canalith Repostioning;DME Instruction;Gait training;Stair training;Functional mobility training;Therapeutic activities;Therapeutic exercise;Balance training;Neuromuscular re-education;Patient/family education;Vestibular    PT Next Visit Plan  Progress multi-sensory training with head/eye motion, balance master training activities, rockerboard, dynamic gait activities.      Consulted and Agree with Plan of Care  Patient       Patient will benefit from skilled therapeutic intervention in order to improve the following deficits and impairments:  Abnormal gait, Decreased balance, Difficulty walking, Dizziness  Visit  Diagnosis: Dizziness and giddiness  Unsteadiness on feet  Ataxic gait     Problem List Patient Active Problem List   Diagnosis Date Noted  . Dizziness 03/10/2018  . Benign essential tremor 03/10/2018  . Disequilibrium 03/10/2018  . Lacunar stroke ([place]) 02/18/2018  . Dysesthesia of multiple sites 03/19/2014  . Restless legs syndrome (RLS) 03/19/2014  . Anemia associated with nutritional deficiency 03/19/2014  . Hypersomnia with sleep apnea, unspecified 03/19/2014  . Acute medial meniscus tear of left knee 10/16/2012  . Leukopenia 08/12/2012  . CALCULUS OF KIDNEY 10/06/2010  . WEIGHT LOSS 08/10/2010  . DIARRHEA 08/10/2010  . ABDOMINAL PAIN RIGHT LOWER QUADRANT 08/10/2010  . ERUCTATION 04/18/2010  . HYPERSOMNIA 01/24/2010  . 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 VENOUS INSUFFICIENCY, LEGS 08/29/2009  . HYPOTHYROIDISM 04/21/2008  . DEPRESSION 04/21/2008  . SLEEP APNEA, OBSTRUCTIVE 04/21/2008  . DEEP VENOUS THROMBOPHLEBITIS, LEFT, LEG, HX OF 04/21/2008  . WISDOM TEETH EXTRACTION, HX OF 04/21/2008    Desirae Mancusi, PT 05/02/2018, 10:34 AM  [facility] [address] [place], [place], [address] Phone: [phone]  Fax:  [phone]  Name: MEKIYAH GLADWELL MRN: [id number] Date of Birth: [birth year]

## 2018-05-02 NOTE — Patient Instructions (Signed)
Side-Stepping    Walk to left side with eyes open. Take even steps, leading with same foot. Make sure each foot lifts off the floor. Repeat in opposite direction. Repeat for __10 steps, then switch directions. Do __2__ sessions per day.  Copyright  VHI. All rights reserved.   FOR EYE/LETTER EXERCISE:    1) FEET APART - increase to 60 seconds   2) FEET CLOSE TOGETHER - start at 45 seconds, work up to 60 seconds.  Work towards Industrial/product designer   FOR WALL BUMPS:  To make more challenging: perform with feet together - eyes open and eyes closed.  Perform standing on a pillow - feet apart, feet together, eyes open and eyes closed    Single Leg - Eyes Open    Holding support, lift right leg while maintaining balance over other leg. Progress to removing hands from support surface for longer periods of time. Hold__10-12__ seconds. Repeat __2-3__ times per leg.  ALSO PRACTICE LOOKING UP/DOWN, SIDE TO SIDE WHILE STANDING ON ONE LEG   Feet Partial Heel-Toe, Head Motion - Eyes Open    With eyes open, right foot partially in front of the other, move head slowly: up and down 10 times, side to side 10 times.  Switch feet and repeat   Feet Together (Compliant Surface) Head Motion - Eyes Open    With eyes open, standing on compliant surface: pillow, feet together, move head slowly: up and down 10 times, side to side 10 times. Repeat 2 times per session.      Backward    Walk FORWARDS AND backwards with eyes OPEN, HAND CLOSE TO THE WALL TO PREVENT VEERING. Take even steps, making sure each foot lifts off floor. Repeat 4 times.

## 2018-05-07 ENCOUNTER — Encounter: Payer: Self-pay | Admitting: Physical Therapy

## 2018-05-07 ENCOUNTER — Ambulatory Visit: Payer: Medicare Other | Admitting: Physical Therapy

## 2018-05-07 ENCOUNTER — Ambulatory Visit: Payer: Medicare Other | Admitting: Rehabilitative and Restorative Service Providers"

## 2018-05-07 DIAGNOSIS — R260 Ataxic gait: Secondary | ICD-10-CM

## 2018-05-07 DIAGNOSIS — R2681 Unsteadiness on feet: Secondary | ICD-10-CM | POA: Diagnosis not present

## 2018-05-07 DIAGNOSIS — R42 Dizziness and giddiness: Secondary | ICD-10-CM

## 2018-05-07 NOTE — Patient Instructions (Addendum)
Side-Stepping    Place blue Theraband around ankles. Walk to left side with eyes open. Take even steps, leading with same foot. Make sure each foot lifts off the floor. Repeat in opposite direction. Repeat for _20 steps, then switch directions. Do __2__ sessions per day.  HIP: Abduction - Standing (Band)    Place band around legs. Squeeze glutes. Raise leg out and slightly back. Hold _1-2__ seconds. Use __blue______ band. _10__ reps per side, _2__ sets per day.  Hold onto a support.  Repeat with each leg but kicking leg out and back at a diagonal.  10 times each leg with blue band around ankles.  Hold back of chair for support.   Keep Blue band around your ankles and perform backwards walking against the resistance of the blue band.    FOR EYE/LETTER EXERCISE: STANDING ON FOLDED YOGA MAT FOR SOFTER SURFACE  1) FEET APART -  60 seconds  2) FEET CLOSE TOGETHER - 60 seconds.

## 2018-05-07 NOTE — Therapy (Signed)
[facility] [address] [place], [place], [address] Phone: [phone]   Fax:  [phone]  Physical Therapy Treatment  Patient Details  Name: Linda Macdonald MRN: [id number] Date of Birth: Dec 06, 1941 Referring Provider: Melvenia Beam, MD   Encounter Date: 05/07/2018  PT End of Session - 05/07/18 1403    Visit Number  7    Number of Visits  17    Date for PT Re-Evaluation  06/03/18    Authorization Type  [employer]; VL: [place]    PT Start Time  1470    PT Stop Time  1400    PT Time Calculation (min)  45 min    Activity Tolerance  Patient tolerated treatment well    Behavior During Therapy  [facility] for tasks assessed/performed       Past Medical History:  Diagnosis Date  . Arthritis   . Chronic kidney disease   . Depression   . Leukopenia 08/12/2012  . Thyroid disease     Past Surgical History:  Procedure Laterality Date  . EXTRACORPOREAL SHOCK WAVE LITHOTRIPSY Left 04/25/2017   Procedure: LEFT EXTRACORPOREAL SHOCK WAVE LITHOTRIPSY (ESWL);  Surgeon: Raynelle Bring, MD;  Location: [facility];  Service: Urology;  Laterality: Left;  . KNEE ARTHROSCOPY Left 2oyrs ago   L knee x2/ R knee x1  . LITHOTRIPSY  2012   difficulty voiding after procedure, d/c home with foley per pt  . TUBAL LIGATION    . WISDOM TOOTH EXTRACTION     teenager    There were no vitals filed for this visit.  Subjective Assessment - 05/07/18 1320    Subjective  Pt missed appointment this morning and thought it was at 1:15; feeling a bit more off balance today.  No questions about new exercise.    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  Did show chronic lacunar basal ganglia infarct vs. expanding virchow robin space.  Normal  nerve conduction study/EMG.    Patient Stated Goals  To improve balance     Currently in Pain?  No/denies                       [facility] Adult PT Treatment/Exercise - 05/07/18 1325      Exercises   Exercises  Knee/Hip      Knee/Hip Exercises: Standing   Hip Abduction  Stengthening;Both;2 sets;Knee bent;Knee straight    Abduction Limitations  Without UE support: lateral stepping to left and right with blue theraband around ankles 22 steps each direction; open chain hip ABD in standing straight to the side x 10 each LE, diagonal hip ABD + extension x 10 each LE with bilat UE support      Vestibular Treatment/Exercise - 05/07/18 1400      Vestibular Treatment/Exercise   Vestibular Treatment Provided  Gaze    Gaze Exercises  X1 Viewing Horizontal;X1 Viewing Vertical      X1 Viewing Horizontal   Foot Position  feet apart, feet together on compliant surface    Reps  2    Comments  60 seconds      X1 Viewing Vertical   Foot Position  feet apart, feet together on compliant surface    Reps  2  Comments  60 seconds         Balance Exercises - 05/07/18 1401      Balance Exercises: Standing   Tandem Stance  Eyes open;4 reps facing up/down ramp, R/LLE forwards, 10 reps Macdonald turns/nods      Side-Stepping    Place blue Theraband around ankles. Walk to left side with eyes open. Take even steps, leading with same foot. Make sure each foot lifts off the floor. Repeat in opposite direction. Repeat for _20 steps, then switch directions. Do __2__ sessions per day.  HIP: Abduction - Standing (Band)    Place band around legs. Squeeze glutes. Raise leg out and slightly back. Hold _1-2__ seconds. Use __blue______ band. _10__ reps per side, _2__ sets per day.  Hold onto a support.  Repeat with each leg but kicking leg out and back at a diagonal.  10 times each leg with blue band around ankles.  Hold back of chair for support.   Keep Blue band around your ankles and  perform backwards walking against the resistance of the blue band.    FOR EYE/LETTER EXERCISE: STANDING ON FOLDED YOGA MAT FOR SOFTER SURFACE  1) FEET APART -  60 seconds  2) FEET CLOSE TOGETHER - 60 seconds.     PT Education - 05/07/18 1402    Education provided  Yes    Education Details  upgraded HEP    Person(s) Educated  Patient    Methods  Explanation;Demonstration;Handout    Comprehension  Verbalized understanding;Returned demonstration       PT Short Term Goals - 05/02/18 0950      PT SHORT TERM GOAL #1   Title  Pt will participate in further balance and vestibular training with assessment of FGA, SOT and MSQ.    Time  4    Period  Weeks    Status  Achieved      PT SHORT TERM GOAL #2   Title  Pt will participate in further assessment of R anterior canal (with use of frenzels) and tolerate treatment of anterior canal BPPV if indicated    Time  4    Period  Weeks    Status  Achieved      PT SHORT TERM GOAL #3   Title  Pt will improve gait velocity to >/= 3.5 ft/sec with more normal BOS    Baseline  3.2 ft/sec, ataxic; on 05/02/18 3.70 ft/sec.    Time  4    Period  Weeks    Status  Achieved      PT SHORT TERM GOAL #4   Title  Pt will report 25% reduction in need to touch furniture/walls when ambulating and reduction in sudden veering    Baseline  15% improvement.    Time  4    Period  Weeks    Status  Partially Met        PT Long Term Goals - 04/23/18 2039      PT LONG TERM GOAL #1   Title  Pt will be independent with HEP    Time  8    Status  New    Target Date  06/03/18      PT LONG TERM GOAL #2   Title  Pt will improve gait velocity to >/= 3.8 ft/sec with more normal BOS and 50% reduction in need to touch furniture/walls and reduction in veering    Time  8    Period  Weeks    Status  New  Target Date  06/03/18      PT LONG TERM GOAL #3   Title  Will improve SOT composite score by >/= 10 points with improved utilization of vestibular  system    Baseline  59 composite score    Time  8    Period  Weeks    Status  Revised    Target Date  06/03/18      PT LONG TERM GOAL #4   Title  Pt will improve FGA score by 4 points    Baseline  17/30    Time  8    Period  Weeks    Status  Revised    Target Date  06/03/18      PT LONG TERM GOAL #5   Title  Pt will report improvement in function on FOTO to >/= 74%    Baseline  64%    Time  8    Period  Weeks    Status  New    Target Date  06/03/18            Plan - 05/07/18 1403    Clinical Impression Statement  Continued to focus on lateral and proximal hip strengthening and training to improve stability in stance and during gait.  Added theraband for resistance during side stepping and retro stepping and added open chain hip ABD and extension.  Due to progress with gaze adaptation upgraded x1 viewing to standing on compliant surface.  Continued to perform multi-sensory balance training on inclined surface, staggered stance with Macdonald turns.  Pt tolerated well but continues to be suprised at how many balance deficits she has.  Will continue to progress towards LTG.    PT Treatment/Interventions  ADLs/Self Care Home Management;Canalith Repostioning;DME Instruction;Gait training;Stair training;Functional mobility training;Therapeutic activities;Therapeutic exercise;Balance training;Neuromuscular re-education;Patient/family education;Vestibular    PT Next Visit Plan  Progress multi-sensory training with Macdonald/eye motion, balance master training activities, rockerboard, dynamic gait activities.      Consulted and Agree with Plan of Care  Patient       Patient will benefit from skilled therapeutic intervention in order to improve the following deficits and impairments:  Abnormal gait, Decreased balance, Difficulty walking, Dizziness  Visit Diagnosis: Dizziness and giddiness  Ataxic gait  Unsteadiness on feet     Problem List Patient Active Problem List   Diagnosis Date  Noted  . Dizziness 03/10/2018  . Benign essential tremor 03/10/2018  . Disequilibrium 03/10/2018  . Lacunar stroke ([place]) 02/18/2018  . Dysesthesia of multiple sites 03/19/2014  . Restless legs syndrome (RLS) 03/19/2014  . Anemia associated with nutritional deficiency 03/19/2014  . Hypersomnia with sleep apnea, unspecified 03/19/2014  . Acute medial meniscus tear of left knee 10/16/2012  . Leukopenia 08/12/2012  . CALCULUS OF KIDNEY 10/06/2010  . WEIGHT LOSS 08/10/2010  . DIARRHEA 08/10/2010  . ABDOMINAL PAIN RIGHT LOWER QUADRANT 08/10/2010  . ERUCTATION 04/18/2010  . HYPERSOMNIA 01/24/2010  . 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 VENOUS INSUFFICIENCY, LEGS 08/29/2009  . HYPOTHYROIDISM 04/21/2008  . DEPRESSION 04/21/2008  . SLEEP APNEA, OBSTRUCTIVE 04/21/2008  . DEEP VENOUS THROMBOPHLEBITIS, LEFT, LEG, HX OF 04/21/2008  . WISDOM TEETH EXTRACTION, HX OF 04/21/2008    Rico Junker, PT, DPT 05/07/18    2:09 PM    [facility] [address] [place], [place], [address] Phone: [phone]   Fax:  [phone]  Name: CICLALY MULCAHEY MRN: [id number] Date of Birth: [birth year]

## 2018-05-09 ENCOUNTER — Encounter: Payer: Medicare Other | Admitting: Rehabilitative and Restorative Service Providers"

## 2018-05-12 ENCOUNTER — Ambulatory Visit: Payer: Self-pay | Admitting: Nurse Practitioner

## 2018-05-12 ENCOUNTER — Encounter: Payer: Self-pay | Admitting: Physical Therapy

## 2018-05-12 ENCOUNTER — Ambulatory Visit: Payer: Medicare Other | Attending: Neurology | Admitting: Physical Therapy

## 2018-05-12 DIAGNOSIS — R1310 Dysphagia, unspecified: Secondary | ICD-10-CM | POA: Insufficient documentation

## 2018-05-12 DIAGNOSIS — H8111 Benign paroxysmal vertigo, right ear: Secondary | ICD-10-CM | POA: Insufficient documentation

## 2018-05-12 DIAGNOSIS — R42 Dizziness and giddiness: Secondary | ICD-10-CM | POA: Diagnosis not present

## 2018-05-12 DIAGNOSIS — R471 Dysarthria and anarthria: Secondary | ICD-10-CM | POA: Diagnosis not present

## 2018-05-12 DIAGNOSIS — R260 Ataxic gait: Secondary | ICD-10-CM | POA: Diagnosis not present

## 2018-05-12 DIAGNOSIS — R2681 Unsteadiness on feet: Secondary | ICD-10-CM | POA: Diagnosis not present

## 2018-05-12 NOTE — Therapy (Signed)
[facility] [address] [place], [place], [address] Phone: [phone]   Fax:  [phone]  Physical Therapy Treatment  Patient Details  Name: Linda Macdonald MRN: [id number] Date of Birth: 1941-07-06 Referring Provider: Melvenia Beam, MD   Encounter Date: 05/12/2018  PT End of Session - 05/12/18 1717    Visit Number  8    Number of Visits  17    Date for PT Re-Evaluation  06/03/18    Authorization Type  [employer]; VL: [place]    PT Start Time  1410    PT Stop Time  1453    PT Time Calculation (min)  43 min    Activity Tolerance  Patient tolerated treatment well    Behavior During Therapy  [facility] for tasks assessed/performed       Past Medical History:  Diagnosis Date  . Arthritis   . Chronic kidney disease   . Depression   . Leukopenia 08/12/2012  . Thyroid disease     Past Surgical History:  Procedure Laterality Date  . EXTRACORPOREAL SHOCK WAVE LITHOTRIPSY Left 04/25/2017   Procedure: LEFT EXTRACORPOREAL SHOCK WAVE LITHOTRIPSY (ESWL);  Surgeon: Raynelle Bring, MD;  Location: [facility];  Service: Urology;  Laterality: Left;  . KNEE ARTHROSCOPY Left 2oyrs ago   L knee x2/ R knee x1  . LITHOTRIPSY  2012   difficulty voiding after procedure, d/c home with foley per pt  . TUBAL LIGATION    . WISDOM TOOTH EXTRACTION     teenager    There were no vitals filed for this visit.  Subjective Assessment - 05/12/18 1414    Subjective  Still having significant dizziness if she rolls over quickly in bed.  Still off balance when ambulating into clinic.  Didn't get to do exercises this weekend due to travel.  Found a Tai Chi for seniors and has been 3 times.      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   Did show chronic lacunar basal ganglia infarct vs. expanding virchow robin space.  Normal nerve conduction study/EMG.    Patient Stated Goals  To improve balance     Currently in Pain?  No/denies             Vestibular Assessment - 05/12/18 1416      Positional Testing   Horizontal Canal Testing  Horizontal Canal Left;Horizontal Canal Right      Horizontal Canal Right   Horizontal Canal Right Duration  >1 minute    Horizontal Canal Right Symptoms  Ageotrophic      Horizontal Canal Left   Horizontal Canal Left Duration  >1 minute     Horizontal Canal Left Symptoms  Ageotrophic >symptoms to L indicating R horizontal canal cupulolithiasis                Vestibular Treatment/Exercise - 05/12/18 1422      Vestibular Treatment/Exercise   Vestibular Treatment Provided  Canalith Repositioning    Canalith Repositioning  Comment      OTHER   Comment  Cupulolith repositioning maneuver with use of vibration and rolling.  Performed for R horizontal canal cupulolithiasis.  Treated x 1 and then re-assessed with horizontal roll indicated no change in nystagmus.  Will re-assess at next visit.  PT Education - 05/12/18 1717    Education provided  Yes    Education Details  cupulolithiasis and treatment    Person(s) Educated  Patient    Methods  Explanation    Comprehension  Verbalized understanding       PT Short Term Goals - 05/02/18 0950      PT SHORT TERM GOAL #1   Title  Pt will participate in further balance and vestibular training with assessment of FGA, SOT and MSQ.    Time  4    Period  Weeks    Status  Achieved      PT SHORT TERM GOAL #2   Title  Pt will participate in further assessment of R anterior canal (with use of frenzels) and tolerate treatment of anterior canal BPPV if indicated    Time  4    Period  Weeks    Status  Achieved      PT SHORT TERM GOAL #3   Title  Pt will improve gait velocity to >/= 3.5 ft/sec with more normal BOS     Baseline  3.2 ft/sec, ataxic; on 05/02/18 3.70 ft/sec.    Time  4    Period  Weeks    Status  Achieved      PT SHORT TERM GOAL #4   Title  Pt will report 25% reduction in need to touch furniture/walls when ambulating and reduction in sudden veering    Baseline  15% improvement.    Time  4    Period  Weeks    Status  Partially Met        PT Long Term Goals - 04/23/18 2039      PT LONG TERM GOAL #1   Title  Pt will be independent with HEP    Time  8    Status  New    Target Date  06/03/18      PT LONG TERM GOAL #2   Title  Pt will improve gait velocity to >/= 3.8 ft/sec with more normal BOS and 50% reduction in need to touch furniture/walls and reduction in veering    Time  8    Period  Weeks    Status  New    Target Date  06/03/18      PT LONG TERM GOAL #3   Title  Will improve SOT composite score by >/= 10 points with improved utilization of vestibular system    Baseline  59 composite score    Time  8    Period  Weeks    Status  Revised    Target Date  06/03/18      PT LONG TERM GOAL #4   Title  Pt will improve FGA score by 4 points    Baseline  17/30    Time  8    Period  Weeks    Status  Revised    Target Date  06/03/18      PT LONG TERM GOAL #5   Title  Pt will report improvement in function on FOTO to >/= 74%    Baseline  64%    Time  8    Period  Weeks    Status  New    Target Date  06/03/18            Plan - 05/12/18 1718    Clinical Impression Statement  Due to pt reporting ongoing spinning with rolling in the bed performed reassessment of horizontal canals with roll  test.  Today pt presents with mild, ageotropic nystagmus of long duration.  Pt unable to determine most/least symptomatic side but felt that L side was more symptomatic.  Performed one cupulolith repositioning maneuver with vibration with no change in nystagmus.  During second assessment pt's nystagmus appeared to be stronger when rolling to R indicating cupulolithiasis in L horizontal  canal.  Will treat L horizontal canal at next session and will continue to progress towards LTG.    Rehab Potential  Fair    Clinical Impairments Affecting Rehab Potential  uknown etiology of deficits    PT Frequency  2x / week    PT Duration  8 weeks    PT Treatment/Interventions  ADLs/Self Care Home Management;Canalith Repostioning;DME Instruction;Gait training;Stair training;Functional mobility training;Therapeutic activities;Therapeutic exercise;Balance training;Neuromuscular re-education;Patient/family education;Vestibular    PT Next Visit Plan  treat L horizontal canal cupulolithiasis with vibration.  continue to progress x 1 viewing and corner balance, rockerboard for balance reactions.  Gait with greater balance challenges.    Consulted and Agree with Plan of Care  Patient       Patient will benefit from skilled therapeutic intervention in order to improve the following deficits and impairments:  Abnormal gait, Decreased balance, Difficulty walking, Dizziness  Visit Diagnosis: Dizziness and giddiness  Ataxic gait  Unsteadiness on feet  BPPV (benign paroxysmal positional vertigo), right     Problem List Patient Active Problem List   Diagnosis Date Noted  . Dizziness 03/10/2018  . Benign essential tremor 03/10/2018  . Disequilibrium 03/10/2018  . Lacunar stroke ([place]) 02/18/2018  . Dysesthesia of multiple sites 03/19/2014  . Restless legs syndrome (RLS) 03/19/2014  . Anemia associated with nutritional deficiency 03/19/2014  . Hypersomnia with sleep apnea, unspecified 03/19/2014  . Acute medial meniscus tear of left knee 10/16/2012  . Leukopenia 08/12/2012  . CALCULUS OF KIDNEY 10/06/2010  . WEIGHT LOSS 08/10/2010  . DIARRHEA 08/10/2010  . ABDOMINAL PAIN RIGHT LOWER QUADRANT 08/10/2010  . ERUCTATION 04/18/2010  . HYPERSOMNIA 01/24/2010  . 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 VENOUS INSUFFICIENCY, LEGS 08/29/2009  . HYPOTHYROIDISM 04/21/2008  . DEPRESSION  04/21/2008  . SLEEP APNEA, OBSTRUCTIVE 04/21/2008  . DEEP VENOUS THROMBOPHLEBITIS, LEFT, LEG, HX OF 04/21/2008  . WISDOM TEETH EXTRACTION, HX OF 04/21/2008    Rico Junker, PT, DPT 05/12/18    5:26 PM    [facility] [address] [place], [place], [address] Phone: [phone]   Fax:  [phone]  Name: MEGAN PRESTI MRN: [id number] Date of Birth: Aug 11, 1941

## 2018-05-15 ENCOUNTER — Encounter: Payer: Self-pay | Admitting: Physical Therapy

## 2018-05-15 ENCOUNTER — Telehealth: Payer: Self-pay | Admitting: Physical Therapy

## 2018-05-15 ENCOUNTER — Ambulatory Visit: Payer: Medicare Other | Admitting: Physical Therapy

## 2018-05-15 DIAGNOSIS — R42 Dizziness and giddiness: Secondary | ICD-10-CM | POA: Diagnosis not present

## 2018-05-15 DIAGNOSIS — J040 Acute laryngitis: Secondary | ICD-10-CM | POA: Diagnosis not present

## 2018-05-15 DIAGNOSIS — R2681 Unsteadiness on feet: Secondary | ICD-10-CM

## 2018-05-15 DIAGNOSIS — R260 Ataxic gait: Secondary | ICD-10-CM

## 2018-05-15 DIAGNOSIS — R1312 Dysphagia, oropharyngeal phase: Secondary | ICD-10-CM | POA: Diagnosis not present

## 2018-05-15 NOTE — Patient Instructions (Signed)
Habituation - Tip Card  1.The goal of habituation training is to assist in decreasing symptoms of vertigo, dizziness, or nausea provoked by specific head and body motions. 2.These exercises may initially increase symptoms; however, be persistent and work through symptoms. With repetition and time, the exercises will assist in reducing or eliminating symptoms. 3.Exercises should be stopped and discussed with the therapist if you experience any of the following: - Sudden change or fluctuation in hearing - New onset of ringing in the ears, or increase in current intensity - Any fluid discharge from the ear - Severe pain in neck or back - Extreme nausea  Copyright  VHI. All rights reserved.   Habituation - Rolling   With pillow under head, start on back. Roll to your right side.  Hold until dizziness stops, plus 20 seconds and then roll to the left side.  Hold until dizziness stops, plus 20 seconds.  Repeat sequence 5 times per session. Do 2 sessions per day.      

## 2018-05-15 NOTE — Telephone Encounter (Signed)
Hello Dr. Lucia GaskinsAhern, I have been treating Linda Macdonald for vestibular rehab and she is continuing to report issues with speech/oral motor and cognition.  Pt is interested in a speech therapy evaluation to see if there is anything that can be functionally addressed.  If you agree, would you please enter an order into Epic for Speech therapy evaluation and treatment?  Thanks, Linda Macdonald, PT, DPT 05/15/18    12:57 PM

## 2018-05-15 NOTE — Therapy (Signed)
[facility] [address] [place], [place], [address] Phone: [phone]   Fax:  [phone]  Physical Therapy Treatment  Patient Details  Name: Linda Macdonald MRN: [id number] Date of Birth: 1941/12/06 Referring Provider: Melvenia Beam, MD   Encounter Date: 05/15/2018  PT End of Session - 05/15/18 1217    Visit Number  9    Number of Visits  17    Date for PT Re-Evaluation  06/03/18    Authorization Type  [employer] Medicare; VL: [place]    PT Start Time  1021    PT Stop Time  1105    PT Time Calculation (min)  44 min    Activity Tolerance  Patient tolerated treatment well    Behavior During Therapy  [facility] for tasks assessed/performed       Past Medical History:  Diagnosis Date  . Arthritis   . Chronic kidney disease   . Depression   . Leukopenia 08/12/2012  . Thyroid disease     Past Surgical History:  Procedure Laterality Date  . EXTRACORPOREAL SHOCK WAVE LITHOTRIPSY Left 04/25/2017   Procedure: LEFT EXTRACORPOREAL SHOCK WAVE LITHOTRIPSY (ESWL);  Surgeon: Raynelle Bring, MD;  Location: [facility];  Service: Urology;  Laterality: Left;  . KNEE ARTHROSCOPY Left 2oyrs ago   L knee x2/ R knee x1  . LITHOTRIPSY  2012   difficulty voiding after procedure, d/c home with foley per pt  . TUBAL LIGATION    . WISDOM TOOTH EXTRACTION     teenager    There were no vitals filed for this visit.  Subjective Assessment - 05/15/18 1024    Subjective  "You rescued me from the person playing a loud game on their cell phone."  Dizziness is better, exercises are helping with focus with walking.  Continues to do Tai Chi. Is interested in getting a SLP consult.    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  Did show chronic  lacunar basal ganglia infarct vs. expanding virchow robin space.  Normal nerve conduction study/EMG.    Patient Stated Goals  To improve balance     Currently in Pain?  No/denies             Vestibular Assessment - 05/15/18 1028      Occulomotor Exam   Gaze-induced  Absent      Horizontal Canal Right   Horizontal Canal Right Duration  >1 minute    Horizontal Canal Right Symptoms  Ageotrophic      Horizontal Canal Left   Horizontal Canal Left Duration  >1 minute    Horizontal Canal Left Symptoms  Ageotrophic                Vestibular Treatment/Exercise - 05/15/18 1032      Vestibular Treatment/Exercise   Vestibular Treatment Provided  Canalith Repositioning    Canalith Repositioning  Comment      OTHER   Comment  No change in nystagmus from previous session; pt still unable to determine which side is more symptomatic but nystagmus appears to be stronger with roll to R.  Treatment today focused on L horizontal canal.  Cupulolith repositioning maneuver with use of vibration and rolling.  Performed for L horizontal canal cupulolithiasis.  Treated x 1.  WIth  reassessment pt continues to present with ageotropic nystagmus with R and L rolling, stronger when looking up towards the ceiling and stronger when on R side.            PT Education - 05/15/18 1217    Education provided  Yes    Education Details  habituation with repeated rolling    Person(s) Educated  Patient    Methods  Explanation;Handout    Comprehension  Verbalized understanding       PT Short Term Goals - 05/02/18 0950      PT SHORT TERM GOAL #1   Title  Pt will participate in further balance and vestibular training with assessment of FGA, SOT and MSQ.    Time  4    Period  Weeks    Status  Achieved      PT SHORT TERM GOAL #2   Title  Pt will participate in further assessment of R anterior canal (with use of frenzels) and tolerate treatment of anterior canal BPPV if indicated    Time  4     Period  Weeks    Status  Achieved      PT SHORT TERM GOAL #3   Title  Pt will improve gait velocity to >/= 3.5 ft/sec with more normal BOS    Baseline  3.2 ft/sec, ataxic; on 05/02/18 3.70 ft/sec.    Time  4    Period  Weeks    Status  Achieved      PT SHORT TERM GOAL #4   Title  Pt will report 25% reduction in need to touch furniture/walls when ambulating and reduction in sudden veering    Baseline  15% improvement.    Time  4    Period  Weeks    Status  Partially Met        PT Long Term Goals - 04/23/18 2039      PT LONG TERM GOAL #1   Title  Pt will be independent with HEP    Time  8    Status  New    Target Date  06/03/18      PT LONG TERM GOAL #2   Title  Pt will improve gait velocity to >/= 3.8 ft/sec with more normal BOS and 50% reduction in need to touch furniture/walls and reduction in veering    Time  8    Period  Weeks    Status  New    Target Date  06/03/18      PT LONG TERM GOAL #3   Title  Will improve SOT composite score by >/= 10 points with improved utilization of vestibular system    Baseline  59 composite score    Time  8    Period  Weeks    Status  Revised    Target Date  06/03/18      PT LONG TERM GOAL #4   Title  Pt will improve FGA score by 4 points    Baseline  17/30    Time  8    Period  Weeks    Status  Revised    Target Date  06/03/18      PT LONG TERM GOAL #5   Title  Pt will report improvement in function on FOTO to >/= 74%    Baseline  64%    Time  8    Period  Weeks    Status  New    Target Date  06/03/18  Plan - 05/15/18 1217    Clinical Impression Statement  Performed cupulolith repositioning maneuver for L horizontal canal with use of vibration due to nystagmus demonstrating stronger beat with rolling to R.  With re-assessment pt continues to present with ageotropic nystagmus during horizontal rolling but no subjective symptoms of vertigo, only reports mild sense of "the world catching up" when rolling side  to side.  No gaze holding nystagmus seen in sitting with re-test.  Due to inability to change symptoms with repositioning maneuver will attempt to treat with habituation-repeated rolling.  Advised pt to perform through the weekend.  If no change, will begin to focus more on functional compensations for home.      Rehab Potential  Fair    Clinical Impairments Affecting Rehab Potential  uknown etiology of deficits    PT Frequency  2x / week    PT Duration  8 weeks    PT Treatment/Interventions  ADLs/Self Care Home Management;Canalith Repostioning;DME Instruction;Gait training;Stair training;Functional mobility training;Therapeutic activities;Therapeutic exercise;Balance training;Neuromuscular re-education;Patient/family education;Vestibular    PT Next Visit Plan  How did she tolerate repeated rolling?  re-assess horizontal canals - ageotropic??  continue to progress x 1 viewing and corner balance, rockerboard for balance reactions.  Gait with greater balance challenges - turning/pivoting    Consulted and Agree with Plan of Care  Patient       Patient will benefit from skilled therapeutic intervention in order to improve the following deficits and impairments:  Abnormal gait, Decreased balance, Difficulty walking, Dizziness  Visit Diagnosis: Dizziness and giddiness  Ataxic gait  Unsteadiness on feet     Problem List Patient Active Problem List   Diagnosis Date Noted  . Dizziness 03/10/2018  . Benign essential tremor 03/10/2018  . Disequilibrium 03/10/2018  . Lacunar stroke ([place]) 02/18/2018  . Dysesthesia of multiple sites 03/19/2014  . Restless legs syndrome (RLS) 03/19/2014  . Anemia associated with nutritional deficiency 03/19/2014  . Hypersomnia with sleep apnea, unspecified 03/19/2014  . Acute medial meniscus tear of left knee 10/16/2012  . Leukopenia 08/12/2012  . CALCULUS OF KIDNEY 10/06/2010  . WEIGHT LOSS 08/10/2010  . DIARRHEA 08/10/2010  . ABDOMINAL PAIN RIGHT LOWER  QUADRANT 08/10/2010  . ERUCTATION 04/18/2010  . HYPERSOMNIA 01/24/2010  . 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 VENOUS INSUFFICIENCY, LEGS 08/29/2009  . HYPOTHYROIDISM 04/21/2008  . DEPRESSION 04/21/2008  . SLEEP APNEA, OBSTRUCTIVE 04/21/2008  . DEEP VENOUS THROMBOPHLEBITIS, LEFT, LEG, HX OF 04/21/2008  . WISDOM TEETH EXTRACTION, HX OF 04/21/2008    Rico Junker, PT, DPT 05/15/18    12:23 PM    [facility] [address] [place], [place], [address] Phone: [phone]   Fax:  [phone]  Name: NIKEYA MAXIM MRN: [id number] Date of Birth: 06-28-1941

## 2018-05-16 ENCOUNTER — Other Ambulatory Visit: Payer: Self-pay | Admitting: Neurology

## 2018-05-16 DIAGNOSIS — R4701 Aphasia: Secondary | ICD-10-CM

## 2018-05-16 NOTE — Telephone Encounter (Signed)
Thanks Audra, the order has been placed! Dr Lucia GaskinsAhern

## 2018-05-19 ENCOUNTER — Ambulatory Visit: Payer: Medicare Other | Admitting: Physical Therapy

## 2018-05-19 DIAGNOSIS — R1310 Dysphagia, unspecified: Secondary | ICD-10-CM | POA: Diagnosis not present

## 2018-05-19 DIAGNOSIS — R260 Ataxic gait: Secondary | ICD-10-CM | POA: Diagnosis not present

## 2018-05-19 DIAGNOSIS — R2681 Unsteadiness on feet: Secondary | ICD-10-CM

## 2018-05-19 DIAGNOSIS — R42 Dizziness and giddiness: Secondary | ICD-10-CM | POA: Diagnosis not present

## 2018-05-19 DIAGNOSIS — H8111 Benign paroxysmal vertigo, right ear: Secondary | ICD-10-CM | POA: Diagnosis not present

## 2018-05-19 DIAGNOSIS — R471 Dysarthria and anarthria: Secondary | ICD-10-CM | POA: Diagnosis not present

## 2018-05-19 NOTE — Patient Instructions (Addendum)
Random Direction Head Motion    Walking on solid surface, one hand on the wall -move head and eyes in random directions: up/down, side to side every __3__ steps.  Repeat walking backwards. Repeat sequence __4__ times per session. Do __2__ sessions per day.  Feet Heel-Toe "Tandem"    Arms outstretched, one hand touching a wall walk a straight line bringing one foot directly in front of the other, turning your head to left/right, up/down every 3 steps.  Walk forwards only Repeat for 4 laps per session. Do _2___ sessions per day.   Gaze Stabilization - Tip Card  1.Target must remain in focus, not blurry, and appear stationary while head is in motion. 2.Perform exercises with small head movements (45 to either side of midline). 3.Increase speed of head motion so long as target is in focus. 4.If you wear eyeglasses, be sure you can see target through lens (therapist will give specific instructions for bifocal / progressive lenses). 5.These exercises may provoke dizziness or nausea. Work through these symptoms. If too dizzy, slow head movement slightly. Rest between each exercise. 6.Exercises demand concentration; avoid distractions. 7.For safety, perform standing exercises close to a counter, wall, corner, or next to someone.  Copyright  VHI. All rights reserved.   Gaze Stabilization - Standing Feet Apart   Hold the back of a chair for support.  Do not stand on the yoga mat for now Feet staggered (right foot forwards, left foot back), keeping eyes on target on wall 3 feet away, tilt head down slightly and move head side to side for 30 seconds. Repeat while moving head up and down for 30 seconds.  Switch feet so left foot forwards, right foot back; repeat head turns and nods x 30 seconds each *Work up to tolerating 60 seconds, as able. Do 2-3 sessions per day.   STOP DOING REPEATED ROLLING.

## 2018-05-19 NOTE — Therapy (Addendum)
[facility] [address] [place], [place], [address] Phone: [phone]   Fax:  [phone]  Physical Therapy Treatment  Patient Details  Name: Linda Macdonald MRN: [id number] Date of Birth: [birth year] Referring Provider: Melvenia Beam, MD   Encounter Date: 05/19/2018  PT End of Session - 05/19/18 1921    Visit Number  10    Number of Visits  17    Date for PT Re-Evaluation  06/03/18    Authorization Type  [employer]; VL: [place]    PT Start Time  4665    PT Stop Time  1620    PT Time Calculation (min)  43 min    Activity Tolerance  Patient tolerated treatment well    Behavior During Therapy  [facility] for tasks assessed/performed       Past Medical History:  Diagnosis Date  . Arthritis   . Chronic kidney disease   . Depression   . Leukopenia 08/12/2012  . Thyroid disease     Past Surgical History:  Procedure Laterality Date  . EXTRACORPOREAL SHOCK WAVE LITHOTRIPSY Left 04/25/2017   Procedure: LEFT EXTRACORPOREAL SHOCK WAVE LITHOTRIPSY (ESWL);  Surgeon: Raynelle Bring, MD;  Location: [facility];  Service: Urology;  Laterality: Left;  . KNEE ARTHROSCOPY Left 2oyrs ago   L knee x2/ R knee x1  . LITHOTRIPSY  2012   difficulty voiding after procedure, d/c home with foley per pt  . TUBAL LIGATION    . WISDOM TOOTH EXTRACTION     teenager    There were no vitals filed for this visit.  Subjective Assessment - 05/19/18 1543    Subjective  Pt does not recall PT's instructions to perform repeated rolling at home; reports she did the Epley maneuver that she looked up on the internet and did some rolling with yoga.  Noticed mild symptoms.  Informed pt that Dr. Jaynee Eagles had entered an order for speech therapy evaluation and treatment.    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  Did show chronic lacunar basal ganglia infarct vs. expanding virchow robin space.  Normal nerve conduction study/EMG.    Patient Stated Goals  To improve balance     Currently in Pain?  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, stronger on R      Horizontal Canal Left   Horizontal Canal Left Duration  >1 minute    Horizontal Canal Left Symptoms  Ageotrophic when looking towards ceiling, less severe on L                Vestibular Treatment/Exercise - 05/19/18 1553      Vestibular Treatment/Exercise   Vestibular Treatment Provided  Canalith Repositioning;Gaze    Canalith Repositioning  Appiani Left    Gaze Exercises  X1 Viewing Horizontal;X1 Viewing Vertical      Appiani Left   Number of Reps   1    Overall Response  No change    Response Details  performed 20 repeated head turns before performing treatment      X1 Viewing Horizontal   Foot Position  solid surface, staggered R/L forwards    Reps  2    Comments  started without UE support and at 60 seconds but due to imbalance decreased to 30 seconds with bilat UE support      X1 Viewing Vertical   Foot Position  solid surface, staggered R/L forwards    Reps  2    Comments  started without UE support and at 60 seconds but due to imbalance decreased to 30 seconds with bilat UE support         Balance Exercises - 05/19/18 1605      Balance Exercises: Standing   Gait with Head Turns  Forward;Retro;Upper extremity support;4 reps    Tandem Gait  Forward;Upper extremity support;4 reps with head turns/nods        PT Education - 05/19/18 1920    Education provided  Yes     Education Details  stopped repeated rolling; educated on central causes of vertigo vs. peripheral, continued to educate pt on importance of following HEP as prescribed and not to perform maneuvers found on internet, updated HEP    Person(s) Educated  Patient    Methods  Explanation;Demonstration;Handout    Comprehension  Verbalized understanding;Returned demonstration       PT Short Term Goals - 05/02/18 0950      PT SHORT TERM GOAL #1   Title  Pt will participate in further balance and vestibular training with assessment of FGA, SOT and MSQ.    Time  4    Period  Weeks    Status  Achieved      PT SHORT TERM GOAL #2   Title  Pt will participate in further assessment of R anterior canal (with use of frenzels) and tolerate treatment of anterior canal BPPV if indicated    Time  4    Period  Weeks    Status  Achieved      PT SHORT TERM GOAL #3   Title  Pt will improve gait velocity to >/= 3.5 ft/sec with more normal BOS    Baseline  3.2 ft/sec, ataxic; on 05/02/18 3.70 ft/sec.    Time  4    Period  Weeks    Status  Achieved      PT SHORT TERM GOAL #4   Title  Pt will report 25% reduction in need to touch furniture/walls when ambulating and reduction in sudden veering    Baseline  15% improvement.    Time  4    Period  Weeks    Status  Partially Met        PT Long Term Goals - 04/23/18 2039      PT LONG TERM GOAL #1   Title  Pt will be independent with HEP    Time  8    Status  New    Target Date  06/03/18      PT LONG TERM GOAL #2   Title  Pt will improve gait velocity to >/= 3.8 ft/sec with more normal BOS and 50% reduction in need to touch furniture/walls and reduction in veering    Time  8    Period  Weeks    Status  New    Target Date  06/03/18      PT LONG TERM GOAL #3   Title  Will  improve SOT composite score by >/= 10 points with improved utilization of vestibular system    Baseline  59 composite score    Time  8    Period  Weeks    Status  Revised     Target Date  06/03/18      PT LONG TERM GOAL #4   Title  Pt will improve FGA score by 4 points    Baseline  17/30    Time  8    Period  Weeks    Status  Revised    Target Date  06/03/18      PT LONG TERM GOAL #5   Title  Pt will report improvement in function on FOTO to >/= 74%    Baseline  64%    Time  8    Period  Weeks    Status  New    Target Date  06/03/18            Plan - 05/19/18 1922    Clinical Impression Statement  Continued to assess pt's horizontal canals for cupulolithiasis.  Pt continued to present with persistent ageotropic nystagmus when looking in the direction of fast phase in R/L roll test but pt continued to report no subjective symptoms of vertigo.  Attempted one further treatment: Appiani for L horizontal canal with no change in nystagmus or subjective symptoms.  Due to lack of effectiveness of treatment, findings of ageotropic nystagmus may be incidental and not peripheral in nature.  Continued to progress standing balance and vestibular gaze adaptation exercises; with increased balance challenges during gait and x 1 viewing pt did require increased UE support today to maintain balance.  Pt continues to demonstrate impaired memory of previous discussions and education and continued speech difficulty; order has been placed by physician for ST evaluation.  PT to continue to address balance and vestibular impairments to progress towards LTG.    Rehab Potential  Fair    Clinical Impairments Affecting Rehab Potential  uknown etiology of deficits    PT Frequency  2x / week    PT Duration  8 weeks    PT Treatment/Interventions  ADLs/Self Care Home Management;Canalith Repostioning;DME Instruction;Gait training;Stair training;Functional mobility training;Therapeutic activities;Therapeutic exercise;Balance training;Neuromuscular re-education;Patient/family education;Vestibular    PT Next Visit Plan  how is she tolerating most updated HEP?  work on turning/pivoting;  continue to progress x 1 viewing; work on weight shifting and balance reactions on compliant surfaces, gait with narrow BOS and head turns/visual scanning    Consulted and Agree with Plan of Care  Patient       Patient will benefit from skilled therapeutic intervention in order to improve the following deficits and impairments:  Abnormal gait, Decreased balance, Difficulty walking, Dizziness  Visit Diagnosis: Dizziness and giddiness  Ataxic gait  Unsteadiness on feet     Problem List Patient Active Problem List   Diagnosis Date Noted  . Dizziness 03/10/2018  . Benign essential tremor 03/10/2018  . Disequilibrium 03/10/2018  . Lacunar stroke ([place]) 02/18/2018  . Dysesthesia of multiple sites 03/19/2014  . Restless legs syndrome (RLS) 03/19/2014  . Anemia associated with nutritional deficiency 03/19/2014  . Hypersomnia with sleep apnea, unspecified 03/19/2014  . Acute medial meniscus tear of left knee 10/16/2012  . Leukopenia 08/12/2012  . CALCULUS OF KIDNEY 10/06/2010  . WEIGHT LOSS 08/10/2010  . DIARRHEA 08/10/2010  . ABDOMINAL PAIN RIGHT LOWER QUADRANT 08/10/2010  . ERUCTATION 04/18/2010  . HYPERSOMNIA 01/24/2010  . 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  VENOUS INSUFFICIENCY, LEGS 08/29/2009  . HYPOTHYROIDISM 04/21/2008  . DEPRESSION 04/21/2008  . SLEEP APNEA, OBSTRUCTIVE 04/21/2008  . DEEP VENOUS THROMBOPHLEBITIS, LEFT, LEG, HX OF 04/21/2008  . WISDOM TEETH EXTRACTION, HX OF 04/21/2008    Rico Junker, PT, DPT 05/19/18    7:55 PM    [facility] [address] [place], [place], [address] Phone: [phone]   Fax:  [phone]  Name: Linda Macdonald MRN: [id number] Date of Birth: 20-Nov-1941

## 2018-05-20 ENCOUNTER — Ambulatory Visit: Payer: Medicare Other | Admitting: Speech Pathology

## 2018-05-20 DIAGNOSIS — R1310 Dysphagia, unspecified: Secondary | ICD-10-CM

## 2018-05-20 DIAGNOSIS — R42 Dizziness and giddiness: Secondary | ICD-10-CM | POA: Diagnosis not present

## 2018-05-20 DIAGNOSIS — R471 Dysarthria and anarthria: Secondary | ICD-10-CM

## 2018-05-20 NOTE — Therapy (Signed)
[facility] [address] [place], [place], [address] Phone: [phone]   Fax:  [phone]  Speech Language Pathology Evaluation  Patient Details  Name: Linda Macdonald MRN: [id number] Date of Birth: 09/24/1941 Referring Provider: Dr. Naomie Dean   Encounter Date: 05/20/2018  End of Session - 05/20/18 1559    Visit Number  1    Number of Visits  9    Date for SLP Re-Evaluation  07/01/18    SLP Start Time  1315    SLP Stop Time   1356    SLP Time Calculation (min)  41 min    Activity Tolerance  Patient tolerated treatment well       Past Medical History:  Diagnosis Date  . Arthritis   . Chronic kidney disease   . Depression   . Leukopenia 08/12/2012  . Thyroid disease     Past Surgical History:  Procedure Laterality Date  . EXTRACORPOREAL SHOCK WAVE LITHOTRIPSY Left 04/25/2017   Procedure: LEFT EXTRACORPOREAL SHOCK WAVE LITHOTRIPSY (ESWL);  Surgeon: Heloise Purpura, MD;  Location: [facility];  Service: Urology;  Laterality: Left;  . KNEE ARTHROSCOPY Left 2oyrs ago   L knee x2/ R knee x1  . LITHOTRIPSY  2012   difficulty voiding after procedure, d/c home with foley per pt  . TUBAL LIGATION    . WISDOM TOOTH EXTRACTION     teenager    There were no vitals filed for this visit.      SLP Evaluation [facility] - 05/20/18 1550      SLP Visit Information   SLP Received On  05/20/18    Referring Provider  Dr. Naomie Dean    Medical Diagnosis  remote CVA      Subjective   Subjective  "When I talk, my tongue gets stuck - I hate to say this, but a spasm"    Patient/Family Stated Goal  "to talk with ease"      General Information   HPI  77 y.o. female currently receiving PT at our facility. She c/o her speech "hesitating" and her tongue "getting stuck." She does report swallowing diffculty with large pills and dry hard solids "getting stuck" in her throat She had MBSS 04/09/18, which revealed prominent  cricopharyngeus, ? CP bar. It was recommended that she starts meals with a liquid and takes pills with applesauce. 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 She is taking nexium for this. She denies cognitive difficulties and specific cognitive impairments.     Mobility Status  walks independently      Balance Screen   Has the patient fallen in the past 6 months  No    Has the patient had a decrease in activity level because of a fear of falling?   No    Is the patient reluctant to leave their home because of a fear of falling?   No      Prior Functional Status   Cognitive/Linguistic Baseline  Within functional limits    Type of Home  House     Lives With  Alone    Vocation  Retired      IT consultant   Overall Cognitive Status  -- Further assessment may be warranted      Oral Motor/Sensory Function   Overall Oral Motor/Sensory Function  Appears within functional limits for tasks assessed      Motor Speech   Overall Motor Speech  Impaired    Respiration  Impaired    Level of Impairment  Conversation    Phonation  Hoarse    Resonance  Within functional limits    Articulation  Impaired    Level of Impairment  Conversation    Intelligibility  Intelligibility reduced    Word  75-100% accurate    Phrase  75-100% accurate    Sentence  75-100% accurate    Conversation  75-100% accurate    Motor Planning  Witnin functional limits    Motor Speech Errors  Aware    Effective Techniques  Pause                      SLP Education - 05/20/18 1559    Education Details  LPR, swallow precautions,     Person(s) Educated  Patient    Methods  Explanation;Verbal cues;Handout    Comprehension  Verbalized understanding;Need further instruction         SLP Long Term Goals - 05/20/18 1605      SLP LONG TERM GOAL #1   Title  Pt will utilize compensations for dysarthria over 15 minute conversations with rare min A over 2 sessions    Time  4    Period   Weeks    Status  New      SLP LONG TERM GOAL #2   Title  Pt will demonstrate adequate breath support for speech over 8 minute simple conversation with rare min A over 2 sessions    Time  4    Period  Weeks    Status  New      SLP LONG TERM GOAL #3   Title  Pt will follow swallow and reflux precautions per pt report with mod I over 2 sessions    Time  4    Period  Weeks    Status  New      SLP LONG TERM GOAL #4   Title  Cognitive Linguistic Assessment if indicated 1st 3 visits    Time  2    Period  Weeks    Status  New       Plan - 05/20/18 1600    Clinical Impression Statement  Linda Macdonald is a 77 y.o. with remote lacunar infarct. She is referred by her neurologist for aphasia and speech difficulty. Today, she presents with mild dysarthria. Rapid alternating speech tasks [facility]. 2 episodes of reduced intellgilbity during evaluation. Tension in her neck and shoudlers evident. Voice is hoarse. Pt does exhibit reduced breath support for speech, frequently speaking on residual air, affecting resulting in glottal fry.  Sustained /a/ and s:z ratio are WFL. 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 I  recommend short course of ST to maxmize intellgibility and carryover of swallow precautions for QOL.     Speech Therapy Frequency  2x / week    Duration  -- 4 weeks or total of 9 visits    Treatment/Interventions  Aspiration precaution training;Cueing hierarchy;SLP instruction and feedback;Environmental controls;Language facilitation;Compensatory techniques;Functional tasks;Compensatory strategies;Internal/external aids;Patient/family education    Potential to Achieve Goals  Good    SLP Home Exercise Plan  Abdominal breathing for speech    Consulted and Agree with Plan of Care  Patient       Patient will benefit from skilled therapeutic intervention in order to improve the following deficits and impairments:   Dysarthria and anarthria - Plan: SLP plan of care  cert/re-cert  Dysphagia, unspecified type - Plan: SLP plan of care cert/re-cert  Problem List Patient Active Problem List   Diagnosis Date Noted  . Dizziness 03/10/2018  . Benign essential tremor 03/10/2018  . Disequilibrium 03/10/2018  . Lacunar stroke ([facility]) 02/18/2018  . Dysesthesia of multiple sites 03/19/2014  . Restless legs syndrome (RLS) 03/19/2014  . Anemia associated with nutritional deficiency 03/19/2014  . Hypersomnia with sleep apnea, unspecified 03/19/2014  . Acute medial meniscus tear of left knee 10/16/2012  . Leukopenia 08/12/2012  . CALCULUS OF KIDNEY 10/06/2010  . WEIGHT LOSS 08/10/2010  . DIARRHEA 08/10/2010  . ABDOMINAL PAIN RIGHT LOWER QUADRANT 08/10/2010  . ERUCTATION 04/18/2010  . HYPERSOMNIA 01/24/2010  . 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 VENOUS INSUFFICIENCY, LEGS 08/29/2009  . HYPOTHYROIDISM 04/21/2008  . DEPRESSION 04/21/2008  . SLEEP APNEA, OBSTRUCTIVE 04/21/2008  . DEEP VENOUS THROMBOPHLEBITIS, LEFT, LEG, HX OF 04/21/2008  . WISDOM TEETH EXTRACTION, HX OF 04/21/2008    Lewellyn Fultz, Radene JourneyLaura Ann MS, CCC-SLP 05/20/2018, 4:09 PM  [facility] [address] [place], [place], [address] Phone: [phone]   Fax:  [phone]  Name: Linda Macdonald MRN: [id number] Date of Birth: 09/09/1941

## 2018-05-20 NOTE — Patient Instructions (Addendum)
Cricopharyngeal (CP) bar is affecting your swallow, this is why larger and drier foods are getting stuck  Likely the CP Bar is reflux related - read the LPR sheets   Upper Esophageal Sphincter (UES) another name for cricopharyngeal muscle  Consider chewable calcium or liquid - ask doctor  Start meals with a warm liquid  Alternate solids and liquids  Essential tremor can affect your voice  Think about reducing your cough/throat clear - when you feel like you have to cough, try a hard swallow or sip of water instead  Be aware of when your voice sounds hoarse or fry if you are running out of air - take a good belly breath to power your voice and keep tension out of your throat   ABDOMINAL BREATHING    . Shoulders down - this is a cue to relax . Place your hand on your abdomen - this helps you focus on easy abdominal breath support - the best and most relaxed way to breathe . Breathe in through your nose and fill your belly with air, watching your hand move outward . Breathe out through your mouth and watch your belly move in. An audible "sh"  may help   Think of your belly as a balloon, when you fill with air (inhale), the balloon gets bigger. As the air goes out (exhale), the balloon deflates.  If you are having difficulty coordinating this, lay on your back with a plastic cup on your belly and repeat the above steps, watching you belly move up with inhalation and down with exhalations  Practice breathing in and out in front of a mirror, watching your belly Breathe in for a count of 5 and breathe out for a count of 5    Practice this throughout the day when you are not having symptoms. For example: in the car, when watching TV, before medications. Regular practice when you are feeling well is important.   Provided by: Clinton SawyerLaura Albertus Chiarelli. SLP [phone]

## 2018-05-21 ENCOUNTER — Ambulatory Visit: Payer: Medicare Other | Admitting: Adult Health

## 2018-05-22 ENCOUNTER — Encounter: Payer: Self-pay | Admitting: Rehabilitative and Restorative Service Providers"

## 2018-05-22 ENCOUNTER — Ambulatory Visit: Payer: Medicare Other | Admitting: Rehabilitative and Restorative Service Providers"

## 2018-05-22 DIAGNOSIS — R2681 Unsteadiness on feet: Secondary | ICD-10-CM

## 2018-05-22 DIAGNOSIS — R42 Dizziness and giddiness: Secondary | ICD-10-CM | POA: Diagnosis not present

## 2018-05-22 NOTE — Patient Instructions (Signed)
Random Direction Head Motion    Walking on solid surface, one hand on the wall -move head and eyes in random directions: up/down, side to side every __3__ steps.  Repeat walking backwards. Repeat sequence __4__ times per session. Do __2__ sessions per day.  Feet Heel-Toe "Tandem"    Arms outstretched, one hand touching a wall walk a straight line bringing one foot directly in front of the other, turning your head to left/right, up/down every 3 steps.  Walk forwards only Repeat for 4 laps per session. Do _2___ sessions per day.   Gaze Stabilization - Tip Card  1.Target must remain in focus, not blurry, and appear stationary while head is in motion. 2.Perform exercises with small head movements (45 to either side of midline). 3.Increase speed of head motion so long as target is in focus. 4.If you wear eyeglasses, be sure you can see target through lens (therapist will give specific instructions for bifocal / progressive lenses). 5.These exercises may provoke dizziness or nausea. Work through these symptoms. If too dizzy, slow head movement slightly. Rest between each exercise. 6.Exercises demand concentration; avoid distractions. 7.For safety, perform standing exercises close to a counter, wall, corner, or next to someone.  Copyright  VHI. All rights reserved.   Gaze Stabilization - Standing Feet Apart   Hold the back of a chair for support.  Do not stand on the yoga mat for now Feet staggered (right foot forwards, left foot back), keeping eyes on target on wall 3 feet away, tilt head down slightly and move head side to side for 30 seconds. Repeat while moving head up and down for 30 seconds.  Switch feet so left foot forwards, right foot back; repeat head turns and nods x 30 seconds each  ALSO* work on this exercise with feet apart focusing on increasing speed to the point right before you get blurring or jumping of the letter.  Do for 30-60 seconds.  *Work up to tolerating  60 seconds, as able. Do 2-3 sessions per day.   STOP DOING REPEATED ROLLING.

## 2018-05-22 NOTE — Therapy (Signed)
[facility] [address] [place], [place], [address] Phone: [phone]   Fax:  [phone]  Physical Therapy Treatment  Patient Details  Name: Linda Macdonald MRN: [id number] Date of Birth: 05/15/1941 Referring Provider: Melvenia Beam, MD   Encounter Date: 05/22/2018  PT End of Session - 05/22/18 1713    Visit Number  11    Number of Visits  17    Date for PT Re-Evaluation  06/03/18    Authorization Type  [employer]; VL: [place]    PT Start Time  1022    PT Stop Time  1104    PT Time Calculation (min)  42 min    Activity Tolerance  Patient tolerated treatment well    Behavior During Therapy  [facility] for tasks assessed/performed       Past Medical History:  Diagnosis Date  . Arthritis   . Chronic kidney disease   . Depression   . Leukopenia 08/12/2012  . Thyroid disease     Past Surgical History:  Procedure Laterality Date  . EXTRACORPOREAL SHOCK WAVE LITHOTRIPSY Left 04/25/2017   Procedure: LEFT EXTRACORPOREAL SHOCK WAVE LITHOTRIPSY (ESWL);  Surgeon: Raynelle Bring, MD;  Location: [facility];  Service: Urology;  Laterality: Left;  . KNEE ARTHROSCOPY Left 2oyrs ago   L knee x2/ R knee x1  . LITHOTRIPSY  2012   difficulty voiding after procedure, d/c home with foley per pt  . TUBAL LIGATION    . WISDOM TOOTH EXTRACTION     teenager    There were no vitals filed for this visit.  Subjective Assessment - 05/22/18 1025    Subjective  Patient notes that she is able to get up without the sensation that head/vision need to catch up.  She is doing new HEP at gym and it is going well.   Patient notes she is more conscious of turns.     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  No/denies                        [facility] Adult PT Treatment/Exercise - 05/22/18 1100      Ambulation/Gait   Ambulation/Gait  Yes    Ambulation/Gait Assistance  5: Supervision    Ambulation/Gait Assistance Details  Outdoor ambulation on rubber mulch, pine straw, grassy surfaces and stepping off curb without device with supervision to CGA.    Ambulation Distance (Feet)  200 Feet    Assistive device  None    Gait Pattern  Step-through pattern;Narrow base of support;Ataxic;Scissoring    Ambulation Surface  Level      Standardized Balance Assessment   Standardized Balance Assessment  Balance Master Testing      High Level Balance   High Level Balance Comments  Patient scored 63% (compared to age/height norm of 64%) on sensory organization testing.  WNLs on conditions 1, 2, mild decrease condition 3 (trial 1) with trial 2 and 3 WNLs,  mild decrease condition 4 (trial 1) then WNLs trial 2/3, mild decrease condition 5 (trial 1) then normal trial 2-3.  On condition 6, WNLs trial 2, "fall" on trial 1, 3.  Patient shows learning curve on conditions 3,4,5.  Sensory use is WNLs somatosensory, visual and vestibular  with mild decrease in preferential.  Patient uses a knee/chest strategy that appears compensatory compared to ankle/hip.        Neuro Re-ed    Neuro Re-ed Details   The patient performed turns and reaching tasks for cones in low/high places to mimic functional turns as she reports difficulties intermittently during kitchen tasks.  No difficulties today during activity.      Vestibular Treatment/Exercise - 05/22/18 1721      Vestibular Treatment/Exercise   Vestibular Treatment Provided  Gaze    Gaze Exercises  X1 Viewing Horizontal      X1 Viewing Horizontal   Foot Position  solid surface, with feet partial heel/toe and then with feet apart    Reps  3    Comments  performed initially partial heel/toe, however patient speed of head motion significantly dec'd, therefore performed with feet  apart as well.  Discussed emphasis for patient on speed of head motion with visual fixation (she does get retinal slip with inc'd speed) and on balance when feet staggered            PT Education - 05/22/18 1105    Education provided  Yes    Education Details  modified HEP gaze x 1 viewing to also complete iwth feet apart in order to maintain integrity of speed with activity for adaptation.    Person(s) Educated  Patient    Methods  Explanation;Demonstration    Comprehension  Verbalized understanding;Returned demonstration       PT Short Term Goals - 05/02/18 0950      PT SHORT TERM GOAL #1   Title  Pt will participate in further balance and vestibular training with assessment of FGA, SOT and MSQ.    Time  4    Period  Weeks    Status  Achieved      PT SHORT TERM GOAL #2   Title  Pt will participate in further assessment of R anterior canal (with use of frenzels) and tolerate treatment of anterior canal BPPV if indicated    Time  4    Period  Weeks    Status  Achieved      PT SHORT TERM GOAL #3   Title  Pt will improve gait velocity to >/= 3.5 ft/sec with more normal BOS    Baseline  3.2 ft/sec, ataxic; on 05/02/18 3.70 ft/sec.    Time  4    Period  Weeks    Status  Achieved      PT SHORT TERM GOAL #4   Title  Pt will report 25% reduction in need to touch furniture/walls when ambulating and reduction in sudden veering    Baseline  15% improvement.    Time  4    Period  Weeks    Status  Partially Met        PT Long Term Goals - 05/22/18 1713      PT LONG TERM GOAL #1   Title  Pt will be independent with HEP    Time  8    Status  New    Target Date  06/03/18      PT LONG TERM GOAL #2   Title  Pt will improve gait velocity to >/= 3.8 ft/sec with more normal BOS and 50% reduction in need to touch furniture/walls and reduction in veering    Time  8    Period  Weeks    Status  New      PT LONG TERM  GOAL #3   Title  Will improve SOT composite score by >/= 10  points with improved utilization of vestibular system    Baseline  59 composite score  (on 05/22/18, the patient scores 63% * normative for age/height is 64%)    Time  8    Period  Weeks    Status  Revised      PT LONG TERM GOAL #4   Title  Pt will improve FGA score by 4 points    Baseline  17/30    Time  8    Period  Weeks    Status  Revised      PT LONG TERM GOAL #5   Title  Pt will report improvement in function on FOTO to >/= 74%    Baseline  64%    Time  8    Period  Weeks    Status  New            Plan - 05/22/18 1723    Clinical Impression Statement  The patient appears to function independently without difficulty, however has intermittent loss of balance during session.  She notes overall improvement in symptoms, with occasional difficulties when having to turn in small spaces.  PT encourages her to continue current HEP, gym routine and tai chi.    PT Treatment/Interventions  ADLs/Self Care Home Management;Canalith Repostioning;DME Instruction;Gait training;Stair training;Functional mobility training;Therapeutic activities;Therapeutic exercise;Balance training;Neuromuscular re-education;Patient/family education;Vestibular    PT Next Visit Plan  Begin d/c planning as patient independent in functional tasks.  Progress HEP as indicated, turns/pivoting, gaze x 1, balance reactions.    Consulted and Agree with Plan of Care  Patient       Patient will benefit from skilled therapeutic intervention in order to improve the following deficits and impairments:  Abnormal gait, Decreased balance, Difficulty walking, Dizziness  Visit Diagnosis: Dizziness and giddiness  Unsteadiness on feet     Problem List Patient Active Problem List   Diagnosis Date Noted  . Dizziness 03/10/2018  . Benign essential tremor 03/10/2018  . Disequilibrium 03/10/2018  . 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 Dysesthesia of multiple sites 03/19/2014  . Restless legs syndrome (RLS) 03/19/2014  .  Anemia associated with nutritional deficiency 03/19/2014  . Hypersomnia with sleep apnea, unspecified 03/19/2014  . Acute medial meniscus tear of left knee 10/16/2012  . Leukopenia 08/12/2012  . CALCULUS OF KIDNEY 10/06/2010  . WEIGHT LOSS 08/10/2010  . DIARRHEA 08/10/2010  . ABDOMINAL PAIN RIGHT LOWER QUADRANT 08/10/2010  . ERUCTATION 04/18/2010  . HYPERSOMNIA 01/24/2010  . 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 VENOUS INSUFFICIENCY, LEGS 08/29/2009  . HYPOTHYROIDISM 04/21/2008  . DEPRESSION 04/21/2008  . SLEEP APNEA, OBSTRUCTIVE 04/21/2008  . DEEP VENOUS THROMBOPHLEBITIS, LEFT, LEG, HX OF 04/21/2008  . WISDOM TEETH EXTRACTION, HX OF 04/21/2008    ,, PT 05/22/2018, 5:26 PM  [facility] [address] [place], [place], [address] Phone: [phone]   Fax:  [phone]  Name: Linda Macdonald MRN: [id number] Date of Birth: 1941/11/26

## 2018-05-26 ENCOUNTER — Encounter: Payer: Self-pay | Admitting: Physical Therapy

## 2018-05-26 ENCOUNTER — Ambulatory Visit: Payer: Medicare Other | Admitting: Physical Therapy

## 2018-05-26 DIAGNOSIS — R42 Dizziness and giddiness: Secondary | ICD-10-CM

## 2018-05-26 DIAGNOSIS — R2681 Unsteadiness on feet: Secondary | ICD-10-CM

## 2018-05-26 DIAGNOSIS — R1310 Dysphagia, unspecified: Secondary | ICD-10-CM | POA: Diagnosis not present

## 2018-05-26 DIAGNOSIS — R471 Dysarthria and anarthria: Secondary | ICD-10-CM | POA: Diagnosis not present

## 2018-05-26 DIAGNOSIS — H8111 Benign paroxysmal vertigo, right ear: Secondary | ICD-10-CM | POA: Diagnosis not present

## 2018-05-26 DIAGNOSIS — R260 Ataxic gait: Secondary | ICD-10-CM | POA: Diagnosis not present

## 2018-05-26 NOTE — Therapy (Signed)
[facility] [address] [place], [place], [address] Phone: [phone]   Fax:  [phone]  Physical Therapy Treatment  Patient Details  Name: Linda Macdonald MRN: [id number] Date of Birth: 06-26-1941 Referring Provider: Melvenia Beam, MD   Encounter Date: 05/26/2018  PT End of Session - 05/26/18 1538    Visit Number  12    Number of Visits  17    Date for PT Re-Evaluation  06/03/18    Authorization Type  [employer]; VL: [place]    PT Start Time  2426    PT Stop Time  1535    PT Time Calculation (min)  50 min    Activity Tolerance  Patient tolerated treatment well    Behavior During Therapy  [facility] for tasks assessed/performed       Past Medical History:  Diagnosis Date  . Arthritis   . Chronic kidney disease   . Depression   . Leukopenia 08/12/2012  . Thyroid disease     Past Surgical History:  Procedure Laterality Date  . EXTRACORPOREAL SHOCK WAVE LITHOTRIPSY Left 04/25/2017   Procedure: LEFT EXTRACORPOREAL SHOCK WAVE LITHOTRIPSY (ESWL);  Surgeon: Raynelle Bring, MD;  Location: [facility];  Service: Urology;  Laterality: Left;  . KNEE ARTHROSCOPY Left 2oyrs ago   L knee x2/ R knee x1  . LITHOTRIPSY  2012   difficulty voiding after procedure, d/c home with foley per pt  . TUBAL LIGATION    . WISDOM TOOTH EXTRACTION     teenager    There were no vitals filed for this visit.  Subjective Assessment - 05/26/18 1450    Subjective  Really liked Speech therapy and felt the education/information was very helpful.  Was able to garden this morning, bending down to pull weeds without tipping over.    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  No/denies         [facility] PT Assessment - 05/26/18 1452      Observation/Other Assessments   Focus on Therapeutic Outcomes (FOTO)   72% function      Standardized Balance Assessment   Standardized Balance Assessment  10 meter walk test    10 Meter Walk  10 seconds or 3.2 ft/sec       Functional Gait  Assessment   Gait assessed   Yes    Gait Level Surface  Walks 20 ft in less than 7 sec but greater than 5.5 sec, uses assistive device, slower speed, mild gait deviations, or deviates 6-10 in outside of the 12 in walkway width.    Change in Gait Speed  Able to smoothly change walking speed without loss of balance or gait deviation. Deviate no more than 6 in outside of the 12 in walkway width.    Gait with Horizontal Head Turns  Performs head turns smoothly with slight change in gait velocity (eg, minor disruption to smooth gait path), deviates 6-10 in outside 12 in walkway width, or uses an assistive device.    Gait with Vertical Head Turns  Performs head turns with no change in gait. Deviates no more than 6 in outside 12 in walkway width.    Gait and Pivot Turn  Pivot turns safely within 3 sec and stops quickly with no  loss of balance.    Step Over Obstacle  Is able to step over 2 stacked shoe boxes taped together (9 in total height) without changing gait speed. No evidence of imbalance.    Gait with Narrow Base of Support  Ambulates 4-7 steps.    Gait with Eyes Closed  Walks 20 ft, slow speed, abnormal gait pattern, evidence for imbalance, deviates 10-15 in outside 12 in walkway width. Requires more than 9 sec to ambulate 20 ft.    Ambulating Backwards  Walks 20 ft, no assistive devices, good speed, no evidence for imbalance, normal gait    Steps  Alternating feet, no rail.    Total Score  24    FGA comment:  24/30       Random Direction Head Motion    Walking on solid surface,one hand on the wall -move head and eyes in random directions: up/down, side to side, diagonals of an X every __3__ steps.Repeat walking backwards. Repeat sequence __4__  times per session. Do __2__ sessions per day.  Feet Heel-Toe "Tandem"    Arms outstretched, one hand touching a wallwalk a straight line bringing one foot directly in front of the other, PAUSE FOR 2-3 SECONDS AFTER EACH STEP FORWARDS.  Walk forwards only Repeat for4 lapsper session. Do _2___ sessions per day.   Gaze Stabilization - Tip Card  1.Target must remain in focus, not blurry, and appear stationary while head is in motion. 2.Perform exercises with small head movements (45 to either side of midline). 3.Increase speed of head motion so long as target is in focus. 4.If you wear eyeglasses, be sure you can see target through lens (therapist will give specific instructions for bifocal / progressive lenses). 5.These exercises may provoke dizziness or nausea. Work through these symptoms. If too dizzy, slow head movement slightly. Rest between each exercise. 6.Exercises demand concentration; avoid distractions. 7.For safety, perform standing exercises close to a counter, wall, corner, or next to someone.  Copyright  VHI. All rights reserved.  Gaze Stabilization - Standing Feet Apart   Hold the back of a chair for support. Do not stand on the yoga mat for now Feetstaggered (right foot forwards, left foot back), keeping eyes on target on wall 3 feet away, tilt head down slightly and move head side to side for 45 seconds. Repeat while moving head up and down for 45 seconds. Switch feet so left foot forwards, right foot back; repeat head turns and nods x 45 seconds each.  Work towards holding the chair a little less.  *Work up to tolerating 60 seconds, as able. Do 2-3 sessions per day.   Feet Together (Compliant Surface) Head Motion - Eyes Closed    Stand on compliant surface: folded yoga mat with feet together. Close eyes and move head slowly, up and down 10 times, 10 side to side. Repeat 2 times per session. Do 2 sessions per day.       PT Education - 05/26/18  1537    Education provided  Yes    Education Details  progress towards LTG; updated final HEP; plan for one more visit to check in after travel and will D/C next visit    Person(s) Educated  Patient    Methods  Explanation;Demonstration;Handout    Comprehension  Verbalized understanding;Returned demonstration       PT Short Term Goals - 05/02/18 0950      PT SHORT TERM GOAL #1   Title  Pt will participate in further balance and vestibular  training with assessment of FGA, SOT and MSQ.    Time  4    Period  Weeks    Status  Achieved      PT SHORT TERM GOAL #2   Title  Pt will participate in further assessment of R anterior canal (with use of frenzels) and tolerate treatment of anterior canal BPPV if indicated    Time  4    Period  Weeks    Status  Achieved      PT SHORT TERM GOAL #3   Title  Pt will improve gait velocity to >/= 3.5 ft/sec with more normal BOS    Baseline  3.2 ft/sec, ataxic; on 05/02/18 3.70 ft/sec.    Time  4    Period  Weeks    Status  Achieved      PT SHORT TERM GOAL #4   Title  Pt will report 25% reduction in need to touch furniture/walls when ambulating and reduction in sudden veering    Baseline  15% improvement.    Time  4    Period  Weeks    Status  Partially Met        PT Long Term Goals - 05/26/18 1451      PT LONG TERM GOAL #1   Title  Pt will be independent with HEP    Time  8    Status  On-going    Target Date  06/09/18      PT LONG TERM GOAL #2   Title  Pt will improve gait velocity to >/= 3.8 ft/sec with more normal BOS and 50% reduction in need to touch furniture/walls and reduction in veering    Baseline  3.2 ft/sec but with significant decrease in touching the wall and veering    Time  8    Period  Weeks    Status  Partially Met      PT LONG TERM GOAL #3   Title  Will improve SOT composite score by >/= 10 points with improved utilization of vestibular system    Baseline  59 composite score  (on 05/22/18, the patient scores 63% *  normative for age/height is 64%)    Time  8    Period  Weeks    Status  Partially Met      PT LONG TERM GOAL #4   Title  Pt will improve FGA score by 4 points    Baseline  17/30 > 24/30    Time  8    Period  Weeks    Status  Achieved      PT LONG TERM GOAL #5   Title  Pt will report improvement in function on FOTO to >/= 74%    Baseline  64% > 72%    Time  8    Period  Weeks    Status  Partially Met            Plan - 05/26/18 1543    Clinical Impression Statement  Initiated patient's progress towards LTG; pt has met 1/5 LTG and partially met 3 other goals; one goal still ongoing.  Pt is demonstrating improved dynamic balance, improved safety and decreased falls risk during gait as indicated by FGA.  Gait velocity remains the same but with improved stability, decreased veering and decreased use of touching walls and furniture for support.  Pt also shows improved use of vestibular system and balance reactions on SOT and pt's overall self-reported function has improved.  Due to  progress provided pt with upgraded HEP to perform while traveling; pt to return for one more visit to assess tolerance of exercises and that pt is ready for D/C.  Will likely D/C next visit due to progress and pt is performing functional activities and community wellness independently.    PT Treatment/Interventions  ADLs/Self Care Home Management;Canalith Repostioning;DME Instruction;Gait training;Stair training;Functional mobility training;Therapeutic activities;Therapeutic exercise;Balance training;Neuromuscular re-education;Patient/family education;Vestibular    PT Next Visit Plan  if she is still doing well, update HEP if needed and D/C.  D/C from [place] - d/c score has been assessed    Consulted and Agree with Plan of Care  Patient       Patient will benefit from skilled therapeutic intervention in order to improve the following deficits and impairments:  Abnormal gait, Decreased balance, Difficulty walking,  Dizziness  Visit Diagnosis: Dizziness and giddiness  Unsteadiness on feet  Ataxic gait     Problem List Patient Active Problem List   Diagnosis Date Noted  . Dizziness 03/10/2018  . Benign essential tremor 03/10/2018  . Disequilibrium 03/10/2018  . 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 Dysesthesia of multiple sites 03/19/2014  . Restless legs syndrome (RLS) 03/19/2014  . Anemia associated with nutritional deficiency 03/19/2014  . Hypersomnia with sleep apnea, unspecified 03/19/2014  . Acute medial meniscus tear of left knee 10/16/2012  . Leukopenia 08/12/2012  . CALCULUS OF KIDNEY 10/06/2010  . WEIGHT LOSS 08/10/2010  . DIARRHEA 08/10/2010  . ABDOMINAL PAIN RIGHT LOWER QUADRANT 08/10/2010  . ERUCTATION 04/18/2010  . HYPERSOMNIA 01/24/2010  . 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 VENOUS INSUFFICIENCY, LEGS 08/29/2009  . HYPOTHYROIDISM 04/21/2008  . DEPRESSION 04/21/2008  . SLEEP APNEA, OBSTRUCTIVE 04/21/2008  . DEEP VENOUS THROMBOPHLEBITIS, LEFT, LEG, HX OF 04/21/2008  . WISDOM TEETH EXTRACTION, HX OF 04/21/2008    Rico Junker, PT, DPT 05/26/18    3:49 PM    [facility] [address] [place], [place], [address] Phone: [phone]   Fax:  [phone]  Name: Linda Macdonald MRN: [id number] Date of Birth: 08-20-1941

## 2018-05-26 NOTE — Patient Instructions (Signed)
Random Direction Head Motion    Walking on solid surface,one hand on the wall -move head and eyes in random directions: up/down, side to side, diagonals of an X every __3__ steps.Repeat walking backwards. Repeat sequence __4__ times per session. Do __2__ sessions per day.  Feet Heel-Toe "Tandem"    Arms outstretched, one hand touching a wallwalk a straight line bringing one foot directly in front of the other, PAUSE FOR 2-3 SECONDS AFTER EACH STEP FORWARDS.  Walk forwards only Repeat for4 lapsper session. Do _2___ sessions per day.   Gaze Stabilization - Tip Card  1.Target must remain in focus, not blurry, and appear stationary while head is in motion. 2.Perform exercises with small head movements (45 to either side of midline). 3.Increase speed of head motion so long as target is in focus. 4.If you wear eyeglasses, be sure you can see target through lens (therapist will give specific instructions for bifocal / progressive lenses). 5.These exercises may provoke dizziness or nausea. Work through these symptoms. If too dizzy, slow head movement slightly. Rest between each exercise. 6.Exercises demand concentration; avoid distractions. 7.For safety, perform standing exercises close to a counter, wall, corner, or next to someone.  Copyright  VHI. All rights reserved.  Gaze Stabilization - Standing Feet Apart   Hold the back of a chair for support. Do not stand on the yoga mat for now Feetstaggered (right foot forwards, left foot back), keeping eyes on target on wall 3 feet away, tilt head down slightly and move head side to side for 45 seconds. Repeat while moving head up and down for 45 seconds. Switch feet so left foot forwards, right foot back; repeat head turns and nods x 45 seconds each.  Work towards holding the chair a little less.  *Work up to tolerating 60 seconds, as able. Do 2-3 sessions per day.   Feet Together (Compliant Surface) Head Motion - Eyes  Closed    Stand on compliant surface: folded yoga mat with feet together. Close eyes and move head slowly, up and down 10 times, 10 side to side. Repeat 2 times per session. Do 2 sessions per day.

## 2018-06-09 ENCOUNTER — Ambulatory Visit: Payer: Medicare Other | Attending: Neurology | Admitting: Physical Therapy

## 2018-06-09 DIAGNOSIS — R260 Ataxic gait: Secondary | ICD-10-CM | POA: Insufficient documentation

## 2018-06-09 DIAGNOSIS — R471 Dysarthria and anarthria: Secondary | ICD-10-CM | POA: Insufficient documentation

## 2018-06-09 DIAGNOSIS — R2681 Unsteadiness on feet: Secondary | ICD-10-CM | POA: Insufficient documentation

## 2018-06-09 DIAGNOSIS — R1310 Dysphagia, unspecified: Secondary | ICD-10-CM | POA: Insufficient documentation

## 2018-06-09 DIAGNOSIS — R42 Dizziness and giddiness: Secondary | ICD-10-CM | POA: Insufficient documentation

## 2018-06-11 ENCOUNTER — Encounter: Payer: Self-pay | Admitting: Physical Therapy

## 2018-06-11 ENCOUNTER — Ambulatory Visit: Payer: Medicare Other | Admitting: Physical Therapy

## 2018-06-11 DIAGNOSIS — R2681 Unsteadiness on feet: Secondary | ICD-10-CM | POA: Diagnosis present

## 2018-06-11 DIAGNOSIS — R260 Ataxic gait: Secondary | ICD-10-CM | POA: Diagnosis present

## 2018-06-11 DIAGNOSIS — R42 Dizziness and giddiness: Secondary | ICD-10-CM

## 2018-06-11 DIAGNOSIS — R471 Dysarthria and anarthria: Secondary | ICD-10-CM | POA: Diagnosis not present

## 2018-06-11 DIAGNOSIS — R1310 Dysphagia, unspecified: Secondary | ICD-10-CM | POA: Diagnosis present

## 2018-06-11 NOTE — Therapy (Signed)
[facility] [address] [place], [place], [address] Phone: [phone]   Fax:  [phone]  Physical Therapy Treatment and D/C Summary  Patient Details  Name: Linda Macdonald MRN: [id number] Date of Birth: June 25, 1941 Referring Provider: Melvenia Beam, MD   Encounter Date: 06/11/2018  PT End of Session - 06/11/18 1323    Visit Number  13    Number of Visits  17    Date for PT Re-Evaluation  06/03/18    Authorization Type  [employer]; VL: [place]    PT Start Time  1020    PT Stop Time  1103    PT Time Calculation (min)  43 min    Activity Tolerance  Patient tolerated treatment well    Behavior During Therapy  [facility] for tasks assessed/performed       Past Medical History:  Diagnosis Date  . Arthritis   . Chronic kidney disease   . Depression   . Leukopenia 08/12/2012  . Thyroid disease     Past Surgical History:  Procedure Laterality Date  . EXTRACORPOREAL SHOCK WAVE LITHOTRIPSY Left 04/25/2017   Procedure: LEFT EXTRACORPOREAL SHOCK WAVE LITHOTRIPSY (ESWL);  Surgeon: Raynelle Bring, MD;  Location: [facility];  Service: Urology;  Laterality: Left;  . KNEE ARTHROSCOPY Left 2oyrs ago   L knee x2/ R knee x1  . LITHOTRIPSY  2012   difficulty voiding after procedure, d/c home with foley per pt  . TUBAL LIGATION    . WISDOM TOOTH EXTRACTION     teenager    There were no vitals filed for this visit.  Subjective Assessment - 06/11/18 1025    Subjective  Pt returns with significant edema, redness in R hand and forearm.  Pt denies falls but may have been bitten by an insect.  Will be setting up appointment with PCP to have it examined.    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  Yes    Pain Score  5     Pain Location  Hand    Pain Orientation  Right    Pain Descriptors / Indicators  Tender      Reviewed the following exercises for final HEP:   Random Direction Head Motion    Walking on solid surface,one hand on the wall -move head and eyes in random directions: up/down, side to side, diagonals of an X every __3__ steps.Repeat walking backwards. Repeat sequence __4__ times per session. Do __2__ sessions per day.   Feet Heel-Toe "Tandem"    Arms outstretched, one hand touching a wallwalk a straight line bringing one foot directly in front of the other, PAUSE FOR 2-3 SECONDS AFTER EACH STEP FORWARDS.  Walk forwards and then backwards Repeat for4 lapsper session. Do _2___ sessions per day.    WALKING WITH EYES CLOSED: Stand with one hand touching wall: walk forwards and then backwards down hallway with eyes closed and keeping your hand on the wall.  Repeat 4 times    Gaze Stabilization - Tip Card  1.Target must remain in focus, not blurry, and appear stationary while head is in motion. 2.Perform exercises with small head movements (45 to either side of midline). 3.Increase speed of head motion so long as target is in focus. 4.If you wear  eyeglasses, be sure you can see target through lens (therapist will give specific instructions for bifocal / progressive lenses). 5.These exercises may provoke dizziness or nausea. Work through these symptoms. If too dizzy, slow head movement slightly. Rest between each exercise. 6.Exercises demand concentration; avoid distractions. 7.For safety, perform standing exercises close to a counter, wall, corner, or next to someone.  Copyright  VHI. All rights reserved.  Gaze Stabilization - Standing Feet Apart   Hold the back of a chair for support. Stand on yoga mat: perform with feet staggered as below; then perform with feet together on yoga mat Feetstaggered (right foot forwards, left foot back), keeping eyes on target on wall 3  feet away, tilt head down slightly and move head side to side for 60 seconds. Repeat while moving head up and down for 60 seconds. Switch feet so left foot forwards, right foot back; repeat head turns and nods x 60 seconds each.  Work towards holding the chair a little less.   Figure Eight    Walk in a figure eight pattern around 2 obstacles spaced 4 feet apart forwards and then backwards - turn your head to look for obstacle when walking backwards. Repeat __4__ times per session. Do ___1_ sessions per day.       PT Education - 06/11/18 1318    Education provided  Yes    Education Details  final HEP; D/C today    Person(s) Educated  Patient    Methods  Explanation;Demonstration;Handout    Comprehension  Verbalized understanding;Returned demonstration       PT Short Term Goals - 05/02/18 0950      PT SHORT TERM GOAL #1   Title  Pt will participate in further balance and vestibular training with assessment of FGA, SOT and MSQ.    Time  4    Period  Weeks    Status  Achieved      PT SHORT TERM GOAL #2   Title  Pt will participate in further assessment of R anterior canal (with use of frenzels) and tolerate treatment of anterior canal BPPV if indicated    Time  4    Period  Weeks    Status  Achieved      PT SHORT TERM GOAL #3   Title  Pt will improve gait velocity to >/= 3.5 ft/sec with more normal BOS    Baseline  3.2 ft/sec, ataxic; on 05/02/18 3.70 ft/sec.    Time  4    Period  Weeks    Status  Achieved      PT SHORT TERM GOAL #4   Title  Pt will report 25% reduction in need to touch furniture/walls when ambulating and reduction in sudden veering    Baseline  15% improvement.    Time  4    Period  Weeks    Status  Partially Met        PT Long Term Goals - 06/11/18 1327      PT LONG TERM GOAL #1   Title  Pt will be independent with HEP    Time  8    Status  Achieved      PT LONG TERM GOAL #2   Title  Pt will improve gait velocity to >/= 3.8 ft/sec with  more normal BOS and 50% reduction in need to touch furniture/walls and reduction in veering    Baseline  3.2 ft/sec but with significant decrease in touching the wall and veering  Time  8    Period  Weeks    Status  Partially Met      PT LONG TERM GOAL #3   Title  Will improve SOT composite score by >/= 10 points with improved utilization of vestibular system    Baseline  59 composite score  (on 05/22/18, the patient scores 63% * normative for age/height is 64%)    Time  8    Period  Weeks    Status  Partially Met      PT LONG TERM GOAL #4   Title  Pt will improve FGA score by 4 points    Baseline  17/30 > 24/30    Time  8    Period  Weeks    Status  Achieved      PT LONG TERM GOAL #5   Title  Pt will report improvement in function on FOTO to >/= 74%    Baseline  64% > 72%    Time  8    Period  Weeks    Status  Partially Met            Plan - 06/11/18 1324    Clinical Impression Statement  Pt returns to complete final LTG.  Performed and updated current HEP; removed corner balance exercise and added in figure 8 gait forwards and backwards due to pt reporting ongoing issues with turning.  Pt return demonstrated all exercises; provided pt with handout highlighting changes to assist pt recall proper technique.  Pt ready for D/C today.    PT Treatment/Interventions  ADLs/Self Care Home Management;Canalith Repostioning;DME Instruction;Gait training;Stair training;Functional mobility training;Therapeutic activities;Therapeutic exercise;Balance training;Neuromuscular re-education;Patient/family education;Vestibular    PT Next Visit Plan  D/C    Consulted and Agree with Plan of Care  Patient       Patient will benefit from skilled therapeutic intervention in order to improve the following deficits and impairments:  Abnormal gait, Decreased balance, Difficulty walking, Dizziness  Visit Diagnosis: Dizziness and giddiness  Unsteadiness on feet  Ataxic gait     Problem  List Patient Active Problem List   Diagnosis Date Noted  . Dizziness 03/10/2018  . Benign essential tremor 03/10/2018  . Disequilibrium 03/10/2018  . Lacunar stroke ([place]) 02/18/2018  . Dysesthesia of multiple sites 03/19/2014  . Restless legs syndrome (RLS) 03/19/2014  . Anemia associated with nutritional deficiency 03/19/2014  . Hypersomnia with sleep apnea, unspecified 03/19/2014  . Acute medial meniscus tear of left knee 10/16/2012  . Leukopenia 08/12/2012  . CALCULUS OF KIDNEY 10/06/2010  . WEIGHT LOSS 08/10/2010  . DIARRHEA 08/10/2010  . ABDOMINAL PAIN RIGHT LOWER QUADRANT 08/10/2010  . ERUCTATION 04/18/2010  . HYPERSOMNIA 01/24/2010  . 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 VENOUS INSUFFICIENCY, LEGS 08/29/2009  . HYPOTHYROIDISM 04/21/2008  . DEPRESSION 04/21/2008  . SLEEP APNEA, OBSTRUCTIVE 04/21/2008  . DEEP VENOUS THROMBOPHLEBITIS, LEFT, LEG, HX OF 04/21/2008  . WISDOM TEETH EXTRACTION, HX OF 04/21/2008   PHYSICAL THERAPY DISCHARGE SUMMARY  Visits from Start of Care: 13  Current functional level related to goals / functional outcomes: Please see LTG achievement and impression statement   Remaining deficits: Impaired balance   Education / Equipment: HEP  Plan: Patient agrees to discharge.  Patient goals were partially met. Patient is being discharged due to being pleased with the current functional level.  ?????     Rico Junker, PT, DPT 06/11/18    1:30 PM    [facility] [address]  [place], [place], [address] Phone: [phone]   Fax:  [phone]  Name: Linda Macdonald MRN: [id number] Date of Birth: 08-15-1941

## 2018-06-11 NOTE — Patient Instructions (Addendum)
Random Direction Head Motion    Walking on solid surface,one hand on the wall -move head and eyes in random directions: up/down, side to side, diagonals of an X every __3__ steps.Repeat walking backwards. Repeat sequence __4__ times per session. Do __2__ sessions per day.   Feet Heel-Toe "Tandem"    Arms outstretched, one hand touching a wallwalk a straight line bringing one foot directly in front of the other, PAUSE FOR 2-3 SECONDS AFTER EACH STEP FORWARDS.  Walk forwards and then backwards Repeat for4 lapsper session. Do _2___ sessions per day.    WALKING WITH EYES CLOSED: Stand with one hand touching wall: walk forwards and then backwards down hallway with eyes closed and keeping your hand on the wall.  Repeat 4 times    Gaze Stabilization - Tip Card  1.Target must remain in focus, not blurry, and appear stationary while head is in motion. 2.Perform exercises with small head movements (45 to either side of midline). 3.Increase speed of head motion so long as target is in focus. 4.If you wear eyeglasses, be sure you can see target through lens (therapist will give specific instructions for bifocal / progressive lenses). 5.These exercises may provoke dizziness or nausea. Work through these symptoms. If too dizzy, slow head movement slightly. Rest between each exercise. 6.Exercises demand concentration; avoid distractions. 7.For safety, perform standing exercises close to a counter, wall, corner, or next to someone.  Copyright  VHI. All rights reserved.  Gaze Stabilization - Standing Feet Apart   Hold the back of a chair for support. Stand on yoga mat: perform with feet staggered as below; then perform with feet together on yoga mat Feetstaggered (right foot forwards, left foot back), keeping eyes on target on wall 3 feet away, tilt head down slightly and move head side to side for 60 seconds. Repeat while moving head up and down for 60 seconds. Switch feet so  left foot forwards, right foot back; repeat head turns and nods x 60 seconds each.  Work towards holding the chair a little less.   Figure Eight    Walk in a figure eight pattern around 2 obstacles spaced 4 feet apart forwards and then backwards - turn your head to look for obstacle when walking backwards. Repeat __4__ times per session. Do ___1_ sessions per day.

## 2018-06-13 DIAGNOSIS — M79641 Pain in right hand: Secondary | ICD-10-CM | POA: Diagnosis not present

## 2018-06-16 ENCOUNTER — Encounter: Payer: Medicare Other | Admitting: Physical Therapy

## 2018-06-17 DIAGNOSIS — F39 Unspecified mood [affective] disorder: Secondary | ICD-10-CM | POA: Diagnosis not present

## 2018-06-17 DIAGNOSIS — S63601A Unspecified sprain of right thumb, initial encounter: Secondary | ICD-10-CM | POA: Diagnosis not present

## 2018-06-17 DIAGNOSIS — R42 Dizziness and giddiness: Secondary | ICD-10-CM | POA: Diagnosis not present

## 2018-06-17 DIAGNOSIS — R2689 Other abnormalities of gait and mobility: Secondary | ICD-10-CM | POA: Diagnosis not present

## 2018-06-19 ENCOUNTER — Encounter: Payer: Medicare Other | Admitting: Physical Therapy

## 2018-06-23 ENCOUNTER — Encounter: Payer: Medicare Other | Admitting: Physical Therapy

## 2018-06-24 ENCOUNTER — Ambulatory Visit: Payer: Medicare Other | Admitting: Speech Pathology

## 2018-06-24 ENCOUNTER — Encounter: Payer: Self-pay | Admitting: Speech Pathology

## 2018-06-24 DIAGNOSIS — R471 Dysarthria and anarthria: Secondary | ICD-10-CM

## 2018-06-24 DIAGNOSIS — R1310 Dysphagia, unspecified: Secondary | ICD-10-CM

## 2018-06-24 NOTE — Therapy (Signed)
[facility] [address] [place], [place], [address] Phone: [phone]   Fax:  [phone]  Speech Language Pathology Treatment  Patient Details  Name: Linda Macdonald MRN: [id number] Date of Birth: 03/06/1941 Referring Provider: Dr. Naomie DeanAntonia Ahern   Encounter Date: 06/24/2018  End of Session - 06/24/18 1520    Visit Number  2    Number of Visits  9    Date for SLP Re-Evaluation  07/01/18    SLP Start Time  1402    SLP Stop Time   1448    SLP Time Calculation (min)  46 min    Activity Tolerance  Patient tolerated treatment well       Past Medical History:  Diagnosis Date  . Arthritis   . Chronic kidney disease   . Depression   . Leukopenia 08/12/2012  . Thyroid disease     Past Surgical History:  Procedure Laterality Date  . EXTRACORPOREAL SHOCK WAVE LITHOTRIPSY Left 04/25/2017   Procedure: LEFT EXTRACORPOREAL SHOCK WAVE LITHOTRIPSY (ESWL);  Surgeon: Heloise PurpuraBorden, Lester, MD;  Location: [facility];  Service: Urology;  Laterality: Left;  . KNEE ARTHROSCOPY Left 2oyrs ago   L knee x2/ R knee x1  . LITHOTRIPSY  2012   difficulty voiding after procedure, d/c home with foley per pt  . TUBAL LIGATION    . WISDOM TOOTH EXTRACTION     teenager    There were no vitals filed for this visit.  Subjective Assessment - 06/24/18 1502    Subjective  "I have been swallowing hard instead of clearing my throat"    Currently in Pain?  No/denies            ADULT SLP TREATMENT - 06/24/18 1502      General Information   Behavior/Cognition  Alert;Cooperative;Pleasant mood      Treatment Provided   Treatment provided  Cognitive-Linquistic      Cognitive-Linquistic Treatment   Treatment focused on  Dysarthria;Cognition    Skilled Treatment  Further cognitive assessment completed per inital evaluation recommendations,, h/o lacunar CVA and dialated Lorayne Bender[place] space. I administered the Cognitive Linguistic Quick Test (CLQT)  - Linda Macdonald cognitive linguistic skills, including attention, memory, executive functions, language and visuospatial skills are [facility] for her age. Pt verbalized swallow and reflux precauations. Trained pt in abdominal breathing for phonation and dysarthria with usual min to mod A. Pt verbalized 1-2 word utterances with abdmonimal breathing after initial training with usual min A. Abdominal breathing also facilitated reduced laryngeal, neck and shoulder tension.       Assessment / Recommendations / Plan   Plan  Continue with current plan of care      Progression Toward Goals   Progression toward goals  Progressing toward goals       SLP Education - 06/24/18 1514    Education Details  voice conservation, LPR precautions, abdominal breathing    Person(s) Educated  Patient    Methods  Explanation;Verbal cues;Handout    Comprehension  Verbalized understanding;Returned demonstration;Verbal cues required;Tactile cues required;Need further instruction         SLP Long Term Goals - 06/24/18 1519      SLP LONG TERM GOAL #1   Title  Pt will utilize compensations for dysarthria over 15 minute conversations with rare min A over 2 sessions    Time  4    Period  Weeks    Status  On-going      SLP LONG TERM GOAL #2  Title  Pt will demonstrate adequate breath support for speech over 8 minute simple conversation with rare min A over 2 sessions    Time  4    Period  Weeks    Status  On-going      SLP LONG TERM GOAL #3   Title  Pt will follow swallow and reflux precautions per pt report with mod I over 2 sessions    Time  4    Period  Weeks      SLP LONG TERM GOAL #4   Title  Cognitive Linguistic Assessment if indicated 1st 3 visits    Time  2    Period  Weeks    Status  Achieved       Plan - 06/24/18 1515    Clinical Impression Statement  Cognitive assessment completed today due to h/o lacunar CVA and dialated Lorayne Bender spaces. The CLQT revealed cognitive lingusitic skills to be  [facility]. Linda Macdonald continues to have excessive laryngeal, neck and shoulder tension. Trained pt in abdominal breathing to improve voice quality and intelligibility. Pt's speech concerns apprear to be voice related, with laryngeal tension affecting her speech, likely due to LPR. Continue skilled ST to maximize intelligilbity.    Speech Therapy Frequency  2x / week    Duration  -- 4 weeks or a total of 9 visits    Treatment/Interventions  Aspiration precaution training;Cueing hierarchy;SLP instruction and feedback;Environmental controls;Language facilitation;Compensatory techniques;Functional tasks;Compensatory strategies;Internal/external aids;Patient/family education    Potential to Achieve Goals  Good    SLP Home Exercise Plan  Abdominal breathing for speech    Consulted and Agree with Plan of Care  Patient       Patient will benefit from skilled therapeutic intervention in order to improve the following deficits and impairments:   Dysarthria and anarthria  Dysphagia, unspecified type    Problem List Patient Active Problem List   Diagnosis Date Noted  . Dizziness 03/10/2018  . Benign essential tremor 03/10/2018  . Disequilibrium 03/10/2018  . Lacunar stroke ([facility]) 02/18/2018  . Dysesthesia of multiple sites 03/19/2014  . Restless legs syndrome (RLS) 03/19/2014  . Anemia associated with nutritional deficiency 03/19/2014  . Hypersomnia with sleep apnea, unspecified 03/19/2014  . Acute medial meniscus tear of left knee 10/16/2012  . Leukopenia 08/12/2012  . CALCULUS OF KIDNEY 10/06/2010  . WEIGHT LOSS 08/10/2010  . DIARRHEA 08/10/2010  . ABDOMINAL PAIN RIGHT LOWER QUADRANT 08/10/2010  . ERUCTATION 04/18/2010  . HYPERSOMNIA 01/24/2010  . 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 VENOUS INSUFFICIENCY, LEGS 08/29/2009  . HYPOTHYROIDISM 04/21/2008  . DEPRESSION 04/21/2008  . SLEEP APNEA, OBSTRUCTIVE 04/21/2008  . DEEP VENOUS THROMBOPHLEBITIS, LEFT, LEG, HX OF 04/21/2008  . WISDOM  TEETH EXTRACTION, HX OF 04/21/2008    Belma Dyches, Radene Journey MS, CCC-SLP 06/24/2018, 3:20 PM  [facility] [address] [place], [place], [address] Phone: [phone]   Fax:  [phone]   Name: MALEEA CAMILO MRN: [id number] Date of Birth: 1941-10-13

## 2018-06-26 ENCOUNTER — Ambulatory Visit: Payer: Medicare Other | Admitting: Speech Pathology

## 2018-06-26 ENCOUNTER — Encounter: Payer: Medicare Other | Admitting: Physical Therapy

## 2018-06-26 DIAGNOSIS — R471 Dysarthria and anarthria: Secondary | ICD-10-CM | POA: Diagnosis not present

## 2018-06-26 NOTE — Patient Instructions (Addendum)
  Semi-occluded vocal tract exercises - blow with voice into a straw - use high pitch and low pitch Use loud voice and soft voice 20 x  Pitch glides "KNOLL" up and down 10x - gentle   Big Breath - loud "HEY YOU" 5x   Read with power - Big breath before each line - think power - strong voice  Elissa Weinsimmer - Full laryngeal massage for singers, vocal warm ups   BREATHE BREATHE BREATHE with your belly - keep practicing belly breathing

## 2018-06-26 NOTE — Therapy (Signed)
[facility] [address] [place], [place], [address] Phone: [phone]   Fax:  [phone]  Speech Language Pathology Treatment  Patient Details  Name: Linda Macdonald MRN: [id number] Date of Birth: 04/26/1941 Referring Provider: Dr. Naomie DeanAntonia Ahern   Encounter Date: 06/26/2018  End of Session - 06/26/18 1449    Visit Number  3    Number of Visits  9    Date for SLP Re-Evaluation  07/01/18    SLP Start Time  1401    SLP Stop Time   1441    SLP Time Calculation (min)  40 min    Activity Tolerance  Patient tolerated treatment well       Past Medical History:  Diagnosis Date  . Arthritis   . Chronic kidney disease   . Depression   . Leukopenia 08/12/2012  . Thyroid disease     Past Surgical History:  Procedure Laterality Date  . EXTRACORPOREAL SHOCK WAVE LITHOTRIPSY Left 04/25/2017   Procedure: LEFT EXTRACORPOREAL SHOCK WAVE LITHOTRIPSY (ESWL);  Surgeon: Heloise PurpuraBorden, Lester, MD;  [facility]: [facility];  Service: Urology;  Laterality: Left;  . KNEE ARTHROSCOPY Left 2oyrs ago   L knee x2/ R knee x1  . LITHOTRIPSY  2012   difficulty voiding after procedure, d/c home with foley per pt  . TUBAL LIGATION    . WISDOM TOOTH EXTRACTION     teenager    There were no vitals filed for this visit.         ADULT SLP TREATMENT - 06/26/18 1440      General Information   Behavior/Cognition  Alert;Cooperative;Pleasant mood      Treatment Provided   Treatment provided  Cognitive-Linquistic      Pain Assessment   Pain Assessment  No/denies pain      Cognitive-Linquistic Treatment   Treatment focused on  Dysarthria    Skilled Treatment  Trained pt in breath support to achieve volume and reduce hoarseness and tension in pharynx and mandible. Pt achieved adequate volume and clear phonation in simple 2-5 word phrases with occasional min A. Structured speech task pt required occasional min to mod A to carryover abdmonimal  breathing for volume. Conversation required usual mod A, as pt reverts to breathy, hoarse voice attempting to increase volume by tensing pharynx. She reports people have told her she talks too low.  I added pitch glides and semioccluded vocal tract exercises to HEP - pt achieved clear phonation in pitch glides with rare min A and abdmonimal breath      Assessment / Recommendations / Plan   Plan  Continue with current plan of care      Progression Toward Goals   Progression toward goals  Progressing toward goals       SLP Education - 06/26/18 1445    Education Details  HEP, abdominal breathing, power voice    Person(s) Educated  Patient    Methods  Explanation;Verbal cues;Handout    Comprehension  Verbalized understanding;Returned demonstration;Verbal cues required         SLP Long Term Goals - 06/26/18 1448      SLP LONG TERM GOAL #1   Title  Pt will utilize compensations for dysarthria over 15 minute conversations with rare min A over 2 sessions    Time  4    Period  Weeks    Status  On-going      SLP LONG TERM GOAL #2   Title  Pt will demonstrate adequate breath  support for speech over 8 minute simple conversation with rare min A over 2 sessions    Time  4    Period  Weeks    Status  On-going      SLP LONG TERM GOAL #3   Title  Pt will follow swallow and reflux precautions per pt report with mod I over 2 sessions    Time  4    Period  Weeks      SLP LONG TERM GOAL #4   Title  Cognitive Linguistic Assessment if indicated 1st 3 visits    Time  2    Period  Weeks    Status  Achieved       Plan - 06/26/18 1446    Clinical Impression Statement  Pt achieved adequate volume and clear phonation with abdominal breathing and "power" voice. She verbalized reduced tension in throat and jaw when she focuses on abdominal breathing. She reports h/o "chronic cough" about a year ago. Pt continues to report difficulty speaking and others remarking she is talking too low. Continue  skilled ST to maximize intelligibility.     Speech Therapy Frequency  2x / week    Treatment/Interventions  Aspiration precaution training;Cueing hierarchy;SLP instruction and feedback;Environmental controls;Language facilitation;Compensatory techniques;Functional tasks;Compensatory strategies;Internal/external aids;Patient/family education    Potential to Achieve Goals  Good       Patient will benefit from skilled therapeutic intervention in order to improve the following deficits and impairments:   Dysarthria and anarthria    Problem List Patient Active Problem List   Diagnosis Date Noted  . Dizziness 03/10/2018  . Benign essential tremor 03/10/2018  . Disequilibrium 03/10/2018  . Lacunar stroke ([facility]) 02/18/2018  . Dysesthesia of multiple sites 03/19/2014  . Restless legs syndrome (RLS) 03/19/2014  . Anemia associated with nutritional deficiency 03/19/2014  . Hypersomnia with sleep apnea, unspecified 03/19/2014  . Acute medial meniscus tear of left knee 10/16/2012  . Leukopenia 08/12/2012  . CALCULUS OF KIDNEY 10/06/2010  . WEIGHT LOSS 08/10/2010  . DIARRHEA 08/10/2010  . ABDOMINAL PAIN RIGHT LOWER QUADRANT 08/10/2010  . ERUCTATION 04/18/2010  . HYPERSOMNIA 01/24/2010  . 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 VENOUS INSUFFICIENCY, LEGS 08/29/2009  . HYPOTHYROIDISM 04/21/2008  . DEPRESSION 04/21/2008  . SLEEP APNEA, OBSTRUCTIVE 04/21/2008  . DEEP VENOUS THROMBOPHLEBITIS, LEFT, LEG, HX OF 04/21/2008  . WISDOM TEETH EXTRACTION, HX OF 04/21/2008    Lovvorn, Radene Journey MS, CCC-SLP 06/26/2018, 2:49 PM  [facility] [address] [place], [place], [address] Phone: [phone]   Fax:  [phone]   Name: Linda Macdonald MRN: [id number] Date of Birth: September 22, 1941

## 2018-07-01 ENCOUNTER — Ambulatory Visit: Payer: Medicare Other | Admitting: Speech Pathology

## 2018-07-01 ENCOUNTER — Encounter: Payer: Self-pay | Admitting: Speech Pathology

## 2018-07-01 DIAGNOSIS — R1310 Dysphagia, unspecified: Secondary | ICD-10-CM

## 2018-07-01 DIAGNOSIS — R471 Dysarthria and anarthria: Secondary | ICD-10-CM

## 2018-07-01 NOTE — Patient Instructions (Signed)
  Keep hydrating your voice  Continue straw voicing, abdominal breathing without voice   Practice reading with belly breathing with power voice as your feel your abs contract as you are reading  Listen to your voice - when you hear a hoarse, breathy voice, big breath - with power voice

## 2018-07-01 NOTE — Therapy (Signed)
[facility] [address] [place], [place], [address] Phone: [phone]   Fax:  [phone]  Speech Language Pathology Treatment  Patient Details  Name: Linda Macdonald MRN: [id number] Date of Birth: 05/08/1941 Referring Provider: Dr. Naomie DeanAntonia Ahern   Encounter Date: 07/01/2018  End of Session - 07/01/18 1453    Visit Number  4    Number of Visits  9    Date for SLP Re-Evaluation  08/01/18    Authorization Type  extended date for re-eval as pt did initiated ST 4 weeks after eval     SLP Start Time  1404    SLP Stop Time   1445    SLP Time Calculation (min)  41 min    Activity Tolerance  Patient tolerated treatment well       Past Medical History:  Diagnosis Date  . Arthritis   . Chronic kidney disease   . Depression   . Leukopenia 08/12/2012  . Thyroid disease     Past Surgical History:  Procedure Laterality Date  . EXTRACORPOREAL SHOCK WAVE LITHOTRIPSY Left 04/25/2017   Procedure: LEFT EXTRACORPOREAL SHOCK WAVE LITHOTRIPSY (ESWL);  Surgeon: Heloise PurpuraBorden, Lester, MD;  Location: [facility];  Service: Urology;  Laterality: Left;  . KNEE ARTHROSCOPY Left 2oyrs ago   L knee x2/ R knee x1  . LITHOTRIPSY  2012   difficulty voiding after procedure, d/c home with foley per pt  . TUBAL LIGATION    . WISDOM TOOTH EXTRACTION     teenager    There were no vitals filed for this visit.  Subjective Assessment - 07/01/18 1416    Subjective  "I've been doing the exercises and breathing"    Currently in Pain?  No/denies            ADULT SLP TREATMENT - 07/01/18 1417      General Information   Behavior/Cognition  Alert;Cooperative;Pleasant mood      Treatment Provided   Treatment provided  Cognitive-Linquistic      Pain Assessment   Pain Assessment  No/denies pain      Cognitive-Linquistic Treatment   Treatment focused on  Dysarthria      Assessment / Recommendations / Plan   Plan  Continue with current plan of  care      Progression Toward Goals   Progression toward goals  Progressing toward goals           SLP Long Term Goals - 07/01/18 1452      SLP LONG TERM GOAL #1   Title  Pt will utilize compensations for dysarthria over 15 minute conversations with rare min A over 2 sessions    Time  3    Period  Weeks    Status  On-going      SLP LONG TERM GOAL #2   Title  Pt will demonstrate adequate breath support for speech over 8 minute simple conversation with rare min A over 2 sessions    Time  3    Period  Weeks    Status  On-going      SLP LONG TERM GOAL #3   Title  Pt will follow swallow and reflux precautions per pt report with mod I over 2 sessions    Time  3    Period  Weeks      SLP LONG TERM GOAL #4   Title  Cognitive Linguistic Assessment if indicated 1st 3 visits    Time  2  Period  Weeks    Status  Achieved       Plan - 07/01/18 1442    Clinical Impression Statement  Pt continues to require occasional min A for volume / power voice to achieve clear phonation in simple conversation over 15 minutes. She utilize abdominal breathing in this conversation with rare min A. At end of session, when pt was not in "practice mode" her volume decreased significantly with breathy/hoarse voice. Continue skilled ST to maximize intellgilbity in a vareity of community settings.     Speech Therapy Frequency  2x / week    Treatment/Interventions  Aspiration precaution training;Cueing hierarchy;SLP instruction and feedback;Environmental controls;Language facilitation;Compensatory techniques;Functional tasks;Compensatory strategies;Internal/external aids;Patient/family education    Potential to Achieve Goals  Good    SLP Home Exercise Plan  Abdominal breathing for speech       Patient will benefit from skilled therapeutic intervention in order to improve the following deficits and impairments:   Dysarthria and anarthria  Dysphagia, unspecified type    Problem List Patient Active  Problem List   Diagnosis Date Noted  . Dizziness 03/10/2018  . Benign essential tremor 03/10/2018  . Disequilibrium 03/10/2018  . Lacunar stroke ([facility]) 02/18/2018  . Dysesthesia of multiple sites 03/19/2014  . Restless legs syndrome (RLS) 03/19/2014  . Anemia associated with nutritional deficiency 03/19/2014  . Hypersomnia with sleep apnea, unspecified 03/19/2014  . Acute medial meniscus tear of left knee 10/16/2012  . Leukopenia 08/12/2012  . CALCULUS OF KIDNEY 10/06/2010  . WEIGHT LOSS 08/10/2010  . DIARRHEA 08/10/2010  . ABDOMINAL PAIN RIGHT LOWER QUADRANT 08/10/2010  . ERUCTATION 04/18/2010  . HYPERSOMNIA 01/24/2010  . 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 VENOUS INSUFFICIENCY, LEGS 08/29/2009  . HYPOTHYROIDISM 04/21/2008  . DEPRESSION 04/21/2008  . SLEEP APNEA, OBSTRUCTIVE 04/21/2008  . DEEP VENOUS THROMBOPHLEBITIS, LEFT, LEG, HX OF 04/21/2008  . WISDOM TEETH EXTRACTION, HX OF 04/21/2008    Malin Cervini, Radene Journey MS, CCC-SLP 07/01/2018, 2:56 PM  [facility] [address] [place], [place], [address] Phone: [phone]   Fax:  [phone]   Name: Linda Macdonald MRN: [id number] Date of Birth: Nov 12, 1941

## 2018-07-03 ENCOUNTER — Encounter: Payer: Self-pay | Admitting: Speech Pathology

## 2018-07-03 ENCOUNTER — Ambulatory Visit: Payer: Medicare Other | Admitting: Speech Pathology

## 2018-07-03 ENCOUNTER — Encounter: Payer: Medicare Other | Admitting: Speech Pathology

## 2018-07-03 DIAGNOSIS — R471 Dysarthria and anarthria: Secondary | ICD-10-CM | POA: Diagnosis not present

## 2018-07-03 NOTE — Patient Instructions (Signed)
   Continue vocal exercises and breathing practice daily - per your other sheets   Straw voicing (SOVTE) 60 to 120 seconds 3-5x a day AND anytime before you expect to talk (out to dinner, meetings, longer phone conversations etc)  OMMMMMM. [facility], AMMMAMMMMAMM  Be aware of breathy voice or fry voice - listen to yourself  When you practice reading or voicing, use your hand on your belly to feel it come out when you breathe in and contract when you talk or voice  Keep tension out of your neck and shoulders and use your abdomen  Read through your handouts, exercises and reflux handouts a couple times and week for a 2-3 weeks as a reminder  YOU DID A GREAT JOB!! - KEEP UP THE POWER VOICE with your belly

## 2018-07-08 ENCOUNTER — Encounter: Payer: Medicare Other | Admitting: Speech Pathology

## 2018-07-08 NOTE — Therapy (Signed)
[facility] [address] [place], [place], [address] Phone: [phone]   Fax:  [phone]  Speech Language Pathology Treatment  Patient Details  Name: Linda Macdonald MRN: [id number] Date of Birth: 09-Jul-1941 Referring Provider: Dr. Sarina Ill   Encounter Date: 07/03/2018  End of Session - 07/08/18 0841    Visit Number  5    Number of Visits  9    Date for SLP Re-Evaluation  08/01/18    SLP Start Time  0845    SLP Stop Time   0925    SLP Time Calculation (min)  40 min    Activity Tolerance  Patient tolerated treatment well       Past Medical History:  Diagnosis Date  . Arthritis   . Chronic kidney disease   . Depression   . Leukopenia 08/12/2012  . Thyroid disease     Past Surgical History:  Procedure Laterality Date  . EXTRACORPOREAL SHOCK WAVE LITHOTRIPSY Left 04/25/2017   Procedure: LEFT EXTRACORPOREAL SHOCK WAVE LITHOTRIPSY (ESWL);  Surgeon: Raynelle Bring, MD;  Location: [facility];  Service: Urology;  Laterality: Left;  . KNEE ARTHROSCOPY Left 2oyrs ago   L knee x2/ R knee x1  . LITHOTRIPSY  2012   difficulty voiding after procedure, d/c home with foley per pt  . TUBAL LIGATION    . WISDOM TOOTH EXTRACTION     teenager    There were no vitals filed for this visit.       SPEECH THERAPY DISCHARGE SUMMARY  Visits from Start of Care: 5  Current functional level related to goals / functional outcomes: See goals below   Remaining deficits: Mild dysarthria   Education / Equipment: Relflux (LPR) [precautions; HEP and compensations for dysarthria Plan: Patient agrees to discharge.  Patient goals were met. Patient is being discharged due to meeting the stated rehab goals.  ?????              SLP Long Term Goals - 07/08/18 0840      SLP LONG TERM GOAL #1   Title  Pt will utilize compensations for dysarthria over 15 minute conversations with rare min A over 2 sessions    Baseline   07/01/18; 07/03/18    Time  3    Period  Weeks    Status  Achieved      SLP LONG TERM GOAL #2   Title  Pt will demonstrate adequate breath support for speech over 8 minute simple conversation with rare min A over 2 sessions    Time  3    Period  Weeks    Status  Achieved      SLP LONG TERM GOAL #3   Title  Pt will follow swallow and reflux precautions per pt report with mod I over 2 sessions    Time  3    Period  Weeks    Status  Achieved      SLP LONG TERM GOAL #4   Title  Cognitive Linguistic Assessment if indicated 1st 3 visits    Time  2    Period  Weeks    Status  Achieved       Plan - 07/08/18 6659    Clinical Impression Statement  Pt entered ST room today with phonation and volume WNL. She is completitng HEP for dysarthria with mod I. Pt maintained volume and clear phonation over 18 minute ocnversation with supervision cues. Pt requesing d/c due to being pleased with  current level, as well as $40 co-pay. Ideally, pt would return 1-2 more times to maximize carryover, however, as goals met, I support d/c today.     Speech Therapy Frequency  2x / week    Treatment/Interventions  Aspiration precaution training;Cueing hierarchy;SLP instruction and feedback;Environmental controls;Language facilitation;Compensatory techniques;Functional tasks;Compensatory strategies;Internal/external aids;Patient/family education    Potential to Achieve Goals  Good       Patient will benefit from skilled therapeutic intervention in order to improve the following deficits and impairments:   Dysarthria and anarthria    Problem List Patient Active Problem List   Diagnosis Date Noted  . Dizziness 03/10/2018  . Benign essential tremor 03/10/2018  . Disequilibrium 03/10/2018  . Lacunar stroke ([place]) 02/18/2018  . Dysesthesia of multiple sites 03/19/2014  . Restless legs syndrome (RLS) 03/19/2014  . Anemia associated with nutritional deficiency 03/19/2014  . Hypersomnia with sleep apnea,  unspecified 03/19/2014  . Acute medial meniscus tear of left knee 10/16/2012  . Leukopenia 08/12/2012  . CALCULUS OF KIDNEY 10/06/2010  . WEIGHT LOSS 08/10/2010  . DIARRHEA 08/10/2010  . ABDOMINAL PAIN RIGHT LOWER QUADRANT 08/10/2010  . ERUCTATION 04/18/2010  . HYPERSOMNIA 01/24/2010  . 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 VENOUS INSUFFICIENCY, LEGS 08/29/2009  . HYPOTHYROIDISM 04/21/2008  . DEPRESSION 04/21/2008  . SLEEP APNEA, OBSTRUCTIVE 04/21/2008  . DEEP VENOUS THROMBOPHLEBITIS, LEFT, LEG, HX OF 04/21/2008  . WISDOM TEETH EXTRACTION, HX OF 04/21/2008    Chassie Pennix, Annye Rusk MS, CCC-SLP 07/08/2018, 8:42 AM  [facility] [address] [place], [place], [address] Phone: [phone]   Fax:  [phone]   Name: SHERRYE PUGA MRN: [id number] Date of Birth: 1941/05/26

## 2018-07-10 ENCOUNTER — Encounter: Payer: Medicare Other | Admitting: Speech Pathology

## 2018-07-15 ENCOUNTER — Encounter: Payer: Medicare Other | Admitting: Speech Pathology

## 2018-07-17 ENCOUNTER — Encounter: Payer: Medicare Other | Admitting: Speech Pathology

## 2018-08-04 DIAGNOSIS — F329 Major depressive disorder, single episode, unspecified: Secondary | ICD-10-CM | POA: Diagnosis not present

## 2018-08-04 DIAGNOSIS — R2689 Other abnormalities of gait and mobility: Secondary | ICD-10-CM | POA: Diagnosis not present

## 2018-08-04 DIAGNOSIS — M269 Dentofacial anomaly, unspecified: Secondary | ICD-10-CM | POA: Diagnosis not present

## 2018-08-04 DIAGNOSIS — E039 Hypothyroidism, unspecified: Secondary | ICD-10-CM | POA: Diagnosis not present

## 2018-08-08 DIAGNOSIS — Z23 Encounter for immunization: Secondary | ICD-10-CM | POA: Diagnosis not present

## 2018-08-19 ENCOUNTER — Encounter: Payer: Self-pay | Admitting: Neurology

## 2018-08-19 ENCOUNTER — Ambulatory Visit: Payer: Medicare Other | Admitting: Neurology

## 2018-08-19 ENCOUNTER — Encounter

## 2018-08-19 VITALS — BP 124/71 | HR 64 | Ht 64.0 in | Wt 142.0 lb

## 2018-08-19 DIAGNOSIS — R413 Other amnesia: Secondary | ICD-10-CM

## 2018-08-19 DIAGNOSIS — Z818 Family history of other mental and behavioral disorders: Secondary | ICD-10-CM

## 2018-08-19 DIAGNOSIS — F0390 Unspecified dementia without behavioral disturbance: Secondary | ICD-10-CM

## 2018-08-19 DIAGNOSIS — R4701 Aphasia: Secondary | ICD-10-CM

## 2018-08-19 DIAGNOSIS — E538 Deficiency of other specified B group vitamins: Secondary | ICD-10-CM

## 2018-08-19 NOTE — Progress Notes (Signed)
[facility]    Provider:  Dr Lucia Gaskins Referring Provider: Dois Davenport, MD Primary Care Physician:  Dois Davenport, MD  CC:  Memory changes  Interval history 08/19/2018: Patient is back for multiple neurologic symptoms, tremor and imbalance, she was prescribed propranolol in the past and did not take it and also physical therapy vestibular therapy.  MRI of the brain and MRI of the cervical spine show nothing acute. She has continued memory complaints, more short term memory issues.  Her grandfather and father had memory issues starting their 99s, unclear what diagnoses or type of dementia. She notes memory loss progression for years. More short-term memory. She can;t get words out, she knows the words but can;t get it out, she can be reticent in a conversation, she loses things at home, she misses bills but has never been good at that, wellbutrin helps, there has been a question of ADD. She is not aware of telling the same stories, still driving not getting lost but she has taken a wrong turn going to a familiar place.    Interval history 03/31/2018: Patient was evaluated for tremor and imbalance. She was prescribed propranolol and never took it. She was prescribed PT but did not go as she sees a trainer 2x a week and takes yoga.  MRI of the brain and MRI of the cervical spine showed nothing acute.  She was seen by her nurse practitioner with multiple neurologic complaints including equilibrium, dizziness and vertiginous symptoms.  Lacunar infarct seen on CT of the head was possibly an expanded virtual robins space, writing sample did not indicate essential tremor nor did spiral drawing. She is still having difficulty with episodes of "shakiness" and was recently in the ED with dizziness. Still having difficulty swallowing pills. Will order a swallowing eval. Will order propranolol 10mg  as needed for tremor. She has been seeing Physiological scientist last fall. Sh eis going to  PT today and asked them to do vestibular therapy.   HPI:  Linda Macdonald is a 77 y.o. female here as a referral from Dr. Hal Hope for a new consult. She feels her"equilibrium is off". She feels when she walks she has difficulty. She stubbed her foot on the carpet once. Started last summer, no inciting event or head trauma. No falls. Happens every day. Several times she has noticed a tremor. Usually when holding something. She just keeps saying she is "off", she can;t explain it it is just that she sometimes takes a wrong step or an extra step to the right. No resting tremor. She wakes sometimes and can;t go back to sleep. Her first cousin has MS. No neuropathic disorders or neuromuscular disorders, no parkinson's disease in the family. Father had dementia in his mid 28s. 3x, not lately, her visual picture has "flipped up" for 30 seconds, no headache, no numbness, tingling, weakness. No symptoms in the feet, no numbness or tingling in the feet. No loss of smell or taste. She has chronic double vision treated with lenses. Grandmother had tremors. No other focal neurologic deficits, associated symptoms, inciting events or modifiable factors.  Reviewed notes, labs and imaging from outside physicians, which showed:  Personally reviewed Ct head and agree with the following:  TSH normal, b12 normal in the past  IMPRESSION: 1. No acute intracranial abnormality. 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  Reviewed notes.  Patient was seen in May 2015 for tingling dysesthesias.  Constant tingling sensation in her feet and is  slowly ascending.  Unclear what initially started on.  Onset was 2 years prior she first noticed it.  It started affecting her sleep and more difficult to fall asleep.  Also started noticing trouble with balance and fine motor skills especially during the dance class.  She developed some clamminess and some off-balance feeling.  She continued to be physically active.   Thyroid tests and vitamin D deficiencies were ruled out.  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  She was placed on a dental device for bruxism and snoring mild apnea.  She was tried on Requip.  Reviewed EMG nerve conduction study results and data, right upper extremity and both lower extremities showed no evidence of a peripheral neuropathy or lumbosacral radiculopathy.   Review of Systems: Patient complains of symptoms per HPI as well as the following symptoms: insomnia. Pertinent negatives and positives per HPI. All others negative.   Social History   Socioeconomic History  . Marital status: Widowed    Spouse name: Not on file  . Number of children: 3  . Years of education: College  . Highest education level: Not on file  Occupational History  . Not on file  Social Needs  . Financial resource strain: Not on file  . Food insecurity:    Worry: Not on file    Inability: Not on file  . Transportation needs:    Medical: Not on file    Non-medical: Not on file  Tobacco Use  . Smoking status: Never Smoker  . Smokeless tobacco: Never Used  Substance and Sexual Activity  . Alcohol use: Yes    Comment: socially  . Drug use: No  . Sexual activity: Not on file  Lifestyle  . Physical activity:    Days per week: Not on file    Minutes per session: Not on file  . Stress: Not on file  Relationships  . Social connections:    Talks on phone: Not on file    Gets together: Not on file    Attends religious service: Not on file    Active member of club or organization: Not on file    Attends meetings of clubs or organizations: Not on file    Relationship status: Not on file  . Intimate partner violence:    Fear of current or ex partner: Not on file    Emotionally abused: Not on file    Physically abused: Not on file    Forced sexual activity: Not on file  Other Topics Concern  . Not  on file  Social History Narrative   Patient is a widow and lives alone with her dog.   Patient has three children.   Patient is retired.   Patient has a college education.   Patient is right-handed.   Caffeine: Patient drinks 1 cup of coffee 2-3 times per week       Family History  Problem Relation Age of Onset  . Colon cancer Maternal Grandmother   . Other Son        salivary gland "tumor"    Past Medical History:  Diagnosis Date  . Arthritis   . Chronic kidney disease   . Depression   . Leukopenia 08/12/2012  . Thyroid disease     Past Surgical History:  Procedure Laterality Date  . EXTRACORPOREAL SHOCK WAVE LITHOTRIPSY Left 04/25/2017   Procedure: LEFT EXTRACORPOREAL SHOCK WAVE LITHOTRIPSY (ESWL);  Surgeon: Laverle Patter,  Konrad Dolores, MD;  Location: [facility];  Service: Urology;  Laterality: Left;  . KNEE ARTHROSCOPY Left 2oyrs ago   L knee x2/ R knee x1  . LITHOTRIPSY  2012   difficulty voiding after procedure, d/c home with foley per pt  . TUBAL LIGATION    . WISDOM TOOTH EXTRACTION     teenager    Current Outpatient Medications  Medication Sig Dispense Refill  . aspirin EC 325 MG tablet Take 1 tablet (325 mg total) by mouth daily. 30 tablet 0  . buPROPion (WELLBUTRIN XL) 150 MG 24 hr tablet Take 450 mg by mouth daily.    . Cholecalciferol (VITAMIN D-3) 1000 UNITS CAPS Take 1,000 Units by mouth daily.     Marland Kitchen esomeprazole (NEXIUM) 20 MG capsule Take 20 mg by mouth daily at 12 noon.    Marland Kitchen levothyroxine (LEVOTHROID) 25 MCG tablet Take 1 tablet (25 mcg total) by mouth daily. 90 tablet 3  . propranolol (INDERAL) 10 MG tablet Take 1 tablet (10 mg total) by mouth 3 (three) times daily as needed. 90 tablet 6  . calcium citrate-vitamin D (CITRACAL+D) 315-200 MG-UNIT per tablet Take 1 tablet by mouth daily.     . Turmeric 500 MG TABS Take 500 mg by mouth daily.      No current facility-administered medications for this visit.     Allergies as of 08/19/2018 - Review Complete 08/19/2018    Allergen Reaction Noted  . Erythromycin Swelling 11/13/2007  . Penicillins Swelling and Rash 11/13/2007    Vitals: BP 124/71 (BP Location: Right Arm, Patient Position: Sitting)   Pulse 64   Ht 5\' 4"  (1.626 m)   Wt 142 lb (64.4 kg)   BMI 24.37 kg/m  Last Weight:  Wt Readings from Last 1 Encounters:  08/19/18 142 lb (64.4 kg)   Last Height:   Ht Readings from Last 1 Encounters:  08/19/18 5\' 4"  (1.626 m)    Physical exam: Exam: Gen: NAD, conversant, well nourised, thin, well groomed                     CV: RRR, no MRG. No Carotid Bruits. No peripheral edema, warm, nontender Eyes: Conjunctivae clear without exudates or hemorrhage  Neuro: Detailed Neurologic Exam  Speech:    Speech is normal; fluent and spontaneous with normal comprehension.  Cognition:    The patient is oriented to person, place, and time;     recent and remote memory intact;     language fluent;     normal attention, concentration,     fund of knowledge Cranial Nerves:  MMSE - Mini Mental State Exam 08/19/2018  Orientation to time 4  Orientation to Place 5  Registration 3  Attention/ Calculation 5  Recall 3  Language- name 2 objects 2  Language- repeat 1  Language- follow 3 step command 3  Language- read & follow direction 1  Write a sentence 1  Copy design 1  Total score 29       The pupils are equal, round, and reactive to light. The fundi are normal and spontaneous venous pulsations are present. Visual fields are full to finger confrontation. Extraocular movements are intact. Trigeminal sensation is intact and the muscles of mastication are normal. The face is symmetric. The palate elevates in the midline. Hearing intact. Voice is normal. Shoulder shrug is normal. The tongue has normal motion without fasciculations.   Coordination:    Normal finger to nose and heel to shin. Normal rapid  alternating movements.   Gait:    Heel-toe and tandem gait are normal.   Motor Observation:    No  asymmetry, no atrophy, and no involuntary movements noted. Tone:    Normal muscle tone.    Posture:    Posture is normal. normal erect    Strength:    Strength is V/V in the upper and lower limbs.      Sensation: intact to LT     Reflex Exam:  DTR's:    Deep tendon reflexes in the upper and lower extremities are brisk bilaterally.   Toes:    The toes are downgoing bilaterally.   Clonus:    Clonus is absent.      Assessment/Plan:  77 year old with progressive memory complaints  - Memory changes, continued progression over years, FHx of dementia in father and grandfather, described as short term memory loss and aphasia. Will order an FTD brain scan to evaluate between early alzheimer's or early frontotemporal given symptoms of both. She requests formal memory testing. I suggest if Pet scan negative we hold off on formal memory testing, patient will consider.  -  MRI of the brain was unremarkable for etiology may have shown a lacunar infarct however also could be a virtual Robin space but this would not explain her symptoms and we discussed this with her and advised her to continue on aspirin prevention, watch her cholesterol blood pressure diabetes and other vascular risk factors.  Orders Placed This Encounter  Procedures  . NM PET Metabolic Brain  . B12 and Folate Panel  . Methylmalonic acid, serum  . Homocysteine  . Ambulatory referral to Neuropsychology   - Episodes of shakiness: propranolol prn  - PT in the past for imbalance, as well as dizziness vestibular symptoms  I had a long d/w patient about her remote lacunar stroke which I believe isa Vichow-Robbins space , risk for recurrent stroke/TIAs, personally independently reviewed imaging studies and stroke evaluation results and answered questions. Start ASA 325mg  for secondary stroke prevention and maintain strict control of hypertension with blood pressure goal below 130/90, diabetes with hemoglobin A1c goal below 6.5%  and lipids with LDL cholesterol goal below 70 mg/dL. I also advised the patient to eat a healthy diet with plenty of whole grains, cereals, fruits and vegetables, exercise regularly and maintain ideal body weight .   Cc: Dr. Obie Dredge, MD  [facility] [address] [place], [place] [address]  Phone [phone] Fax [phone]  A total of 25 minutes was spent face-to-face with this patient. Over half this time was spent on counseling patient on the  1. Memory loss   2. FHx: dementia   3. Aphasia   4. Short-term memory loss   5. B12 deficiency   6. Dementia without behavioral disturbance, unspecified dementia type   7. Complaints of memory disturbance     and different diagnostic and therapeutic options, counseling and coordination of care, risks ans benefits of management, compliance, or risk factor reduction and education.

## 2018-08-19 NOTE — Patient Instructions (Signed)
Formal neurocognitive testing (Dr Alinda Dooms) FDG PET Scan for Dementia Labs today MIND Diet   Memory Compensation Strategies  1. Use "WARM" strategy.  W= write it down  A= associate it  R= repeat it  M= make a mental note  2.   You can keep a Glass blower/designer.  Use a 3-ring notebook with sections for the following: calendar, important names and phone numbers,  medications, doctors' names/phone numbers, lists/reminders, and a section to journal what you did  each day.   3.    Use a calendar to write appointments down.  4.    Write yourself a schedule for the day.  This can be placed on the calendar or in a separate section of the Memory Notebook.  Keeping a  regular schedule can help memory.  5.    Use medication organizer with sections for each day or morning/evening pills.  You may need help loading it  6.    Keep a basket, or pegboard by the door.  Place items that you need to take out with you in the basket or on the pegboard.  You may also want to  include a message board for reminders.  7.    Use sticky notes.  Place sticky notes with reminders in a place where the task is performed.  For example: " turn off the  stove" placed by the stove, "lock the door" placed on the door at eye level, " take your medications" on  the bathroom mirror or by the place where you normally take your medications.  8.    Use alarms/timers.  Use while cooking to remind yourself to check on food or as a reminder to take your medicine, or as a  reminder to make a call, or as a reminder to perform another task, etc.

## 2018-08-20 DIAGNOSIS — R413 Other amnesia: Secondary | ICD-10-CM | POA: Insufficient documentation

## 2018-08-21 ENCOUNTER — Encounter: Payer: Self-pay | Admitting: Psychology

## 2018-08-21 ENCOUNTER — Telehealth: Payer: Self-pay | Admitting: *Deleted

## 2018-08-21 LAB — B12 AND FOLATE PANEL: Vitamin B-12: 531 pg/mL (ref 232–1245)

## 2018-08-21 LAB — HOMOCYSTEINE: Homocysteine: 10.6 umol/L (ref 0.0–15.0)

## 2018-08-21 LAB — METHYLMALONIC ACID, SERUM: METHYLMALONIC ACID: 149 nmol/L (ref 0–378)

## 2018-08-21 NOTE — Telephone Encounter (Signed)
Attempted to call pt about her labs being normal. VM box full and received no answer. Will try again later.

## 2018-08-21 NOTE — Telephone Encounter (Signed)
-----   Message from Anson FretAntonia B Ahern, MD sent at 08/20/2018  5:16 PM EDT ----- Labs normal thanks

## 2018-08-22 ENCOUNTER — Telehealth: Payer: Self-pay | Admitting: *Deleted

## 2018-08-22 DIAGNOSIS — M21611 Bunion of right foot: Secondary | ICD-10-CM | POA: Diagnosis not present

## 2018-08-22 DIAGNOSIS — M79671 Pain in right foot: Secondary | ICD-10-CM | POA: Diagnosis not present

## 2018-08-22 DIAGNOSIS — M21619 Bunion of unspecified foot: Secondary | ICD-10-CM | POA: Diagnosis not present

## 2018-08-22 DIAGNOSIS — M2041 Other hammer toe(s) (acquired), right foot: Secondary | ICD-10-CM | POA: Diagnosis not present

## 2018-08-22 NOTE — Telephone Encounter (Signed)
Spoke with patient and informed her labs are normal. She requested results be mailed to her. This RN advised she must come in and sign a release, or she may sign up for My Chart and get results in her e mail. She verbalized understanding, appreciation.

## 2018-08-27 ENCOUNTER — Telehealth: Payer: Self-pay | Admitting: Neurology

## 2018-08-27 NOTE — Telephone Encounter (Signed)
Patient is returning your call.  

## 2018-08-27 NOTE — Telephone Encounter (Addendum)
Called and spoke to patient relayed that PET Scan scan was denied and to keep her apt with Dr. Lucile ShuttersBailiar and Dr. Lucia GaskinsAhern will see her in Follow Up .  Patient understood details .

## 2018-08-27 NOTE — Telephone Encounter (Addendum)
Called patient to tell her her PET  scan was denied buy her insurance . Patient's voice mail was full . I will try and call patient back.   Linda HarmanDana, tell patient her PET scan was declined by insurance. At this point the best thing to do is get the formal memory testing and we can revisit what to do when she gets those results thanks

## 2018-09-01 NOTE — Telephone Encounter (Signed)
Patient's insurance came back with an approval on PET scan . Patient is [facility] Radiology for 7:00 am apt . Patient needs to NPO six hours before.  I have talked to patient she is aware of all details.    Auth # for PET scan [id number] CPT [id number] 08/22/2018-09/20/2018 Arma HeadingDana C [phone]. `

## 2018-09-05 ENCOUNTER — Telehealth: Payer: Self-pay | Admitting: Neurology

## 2018-09-05 NOTE — Telephone Encounter (Signed)
Vince[facility] has called stating re: pt's PET Scan the wrong NPI was given, she is asking for a call back.  Please call her @ [phone]

## 2018-09-08 ENCOUNTER — Telehealth: Payer: Self-pay | Admitting: Neurology

## 2018-09-08 NOTE — Telephone Encounter (Signed)
Updated I have changed NPI .

## 2018-09-08 NOTE — Telephone Encounter (Signed)
Pt is asking Dr Trevor Mace RN calls to address some questions and concerns she has re: the scan on tomorrow.  Please call

## 2018-09-09 ENCOUNTER — Encounter (HOSPITAL_COMMUNITY)
Admission: RE | Admit: 2018-09-09 | Discharge: 2018-09-09 | Disposition: A | Discharge status: Home / Self Care | Payer: Medicare Other | Source: Ambulatory Visit | Attending: Neurology | Admitting: Neurology

## 2018-09-09 DIAGNOSIS — F0390 Unspecified dementia without behavioral disturbance: Secondary | ICD-10-CM | POA: Insufficient documentation

## 2018-09-09 DIAGNOSIS — R4701 Aphasia: Secondary | ICD-10-CM | POA: Insufficient documentation

## 2018-09-09 DIAGNOSIS — R413 Other amnesia: Secondary | ICD-10-CM | POA: Insufficient documentation

## 2018-09-09 LAB — GLUCOSE, CAPILLARY: Glucose-Capillary: 94 mg/dL (ref 70–99)

## 2018-09-09 MED ORDER — FLUDEOXYGLUCOSE F - 18 (FDG) INJECTION: 9.9000 | Freq: Once | INTRAVENOUS | Status: DC | PRN

## 2018-09-09 NOTE — Telephone Encounter (Signed)
Called pt and discussed her FDG pet scan results. She is aware it is normal with no indication of dementia. She still plans to see Dr. Daneen Schick in March. Her questions were answered. She feels exercise and strength training has helped her walking and balance. She was advised to call back if she has any further questions or concerns. Pt verbalized appreciation.    Notes recorded by Anson Fret, MD on 09/09/2018 at 12:13 PM EDT FDG PET Scan is normal no indication of dementia thanks

## 2018-09-09 NOTE — Telephone Encounter (Signed)
Returned pt's call and LVM asking for call back. Left office number in message.  

## 2018-09-10 DIAGNOSIS — K210 Gastro-esophageal reflux disease with esophagitis: Secondary | ICD-10-CM | POA: Diagnosis not present

## 2018-09-11 ENCOUNTER — Other Ambulatory Visit (HOSPITAL_COMMUNITY): Payer: Self-pay | Admitting: Otolaryngology

## 2018-09-11 DIAGNOSIS — R221 Localized swelling, mass and lump, neck: Secondary | ICD-10-CM

## 2018-09-15 ENCOUNTER — Ambulatory Visit: Payer: Medicare Other | Admitting: Neurology

## 2018-09-22 ENCOUNTER — Ambulatory Visit (HOSPITAL_COMMUNITY)
Admission: RE | Admit: 2018-09-22 | Discharge: 2018-09-22 | Disposition: A | Discharge status: Home / Self Care | Payer: Medicare Other | Source: Ambulatory Visit | Attending: Otolaryngology | Admitting: Otolaryngology

## 2018-09-22 DIAGNOSIS — R221 Localized swelling, mass and lump, neck: Secondary | ICD-10-CM | POA: Insufficient documentation

## 2018-11-18 DIAGNOSIS — R2689 Other abnormalities of gait and mobility: Secondary | ICD-10-CM | POA: Diagnosis not present

## 2018-11-18 DIAGNOSIS — E039 Hypothyroidism, unspecified: Secondary | ICD-10-CM | POA: Diagnosis not present

## 2018-11-18 DIAGNOSIS — I6381 Other cerebral infarction due to occlusion or stenosis of small artery: Secondary | ICD-10-CM | POA: Diagnosis not present

## 2018-11-18 DIAGNOSIS — L659 Nonscarring hair loss, unspecified: Secondary | ICD-10-CM | POA: Diagnosis not present

## 2018-11-19 DIAGNOSIS — H5000 Unspecified esotropia: Secondary | ICD-10-CM | POA: Diagnosis not present

## 2018-11-19 DIAGNOSIS — H538 Other visual disturbances: Secondary | ICD-10-CM | POA: Diagnosis not present

## 2018-11-19 DIAGNOSIS — H2510 Age-related nuclear cataract, unspecified eye: Secondary | ICD-10-CM | POA: Diagnosis not present

## 2018-11-19 DIAGNOSIS — H532 Diplopia: Secondary | ICD-10-CM | POA: Diagnosis not present

## 2018-11-20 ENCOUNTER — Other Ambulatory Visit: Payer: Self-pay | Admitting: Family Medicine

## 2018-11-20 DIAGNOSIS — M8589 Other specified disorders of bone density and structure, multiple sites: Secondary | ICD-10-CM | POA: Diagnosis not present

## 2018-11-20 DIAGNOSIS — M810 Age-related osteoporosis without current pathological fracture: Secondary | ICD-10-CM | POA: Diagnosis not present

## 2018-11-20 DIAGNOSIS — Z8262 Family history of osteoporosis: Secondary | ICD-10-CM | POA: Diagnosis not present

## 2018-11-20 DIAGNOSIS — G459 Transient cerebral ischemic attack, unspecified: Secondary | ICD-10-CM

## 2018-11-21 ENCOUNTER — Other Ambulatory Visit: Payer: Self-pay | Admitting: Family Medicine

## 2018-11-21 ENCOUNTER — Other Ambulatory Visit (HOSPITAL_COMMUNITY): Payer: Self-pay | Admitting: Family Medicine

## 2018-11-21 DIAGNOSIS — L649 Androgenic alopecia, unspecified: Secondary | ICD-10-CM | POA: Diagnosis not present

## 2018-11-21 DIAGNOSIS — G459 Transient cerebral ischemic attack, unspecified: Secondary | ICD-10-CM

## 2018-11-21 DIAGNOSIS — Z79899 Other long term (current) drug therapy: Secondary | ICD-10-CM | POA: Diagnosis not present

## 2018-11-21 DIAGNOSIS — L650 Telogen effluvium: Secondary | ICD-10-CM | POA: Diagnosis not present

## 2018-11-24 ENCOUNTER — Ambulatory Visit (HOSPITAL_COMMUNITY)
Admission: RE | Admit: 2018-11-24 | Discharge: 2018-11-24 | Disposition: A | Discharge status: Home / Self Care | Payer: Medicare Other | Source: Ambulatory Visit | Attending: Cardiology | Admitting: Cardiology

## 2018-11-24 DIAGNOSIS — Z1231 Encounter for screening mammogram for malignant neoplasm of breast: Secondary | ICD-10-CM | POA: Diagnosis not present

## 2018-11-24 DIAGNOSIS — Z01419 Encounter for gynecological examination (general) (routine) without abnormal findings: Secondary | ICD-10-CM | POA: Diagnosis not present

## 2018-11-24 DIAGNOSIS — L659 Nonscarring hair loss, unspecified: Secondary | ICD-10-CM | POA: Diagnosis not present

## 2018-11-24 DIAGNOSIS — G459 Transient cerebral ischemic attack, unspecified: Secondary | ICD-10-CM | POA: Insufficient documentation

## 2018-11-24 DIAGNOSIS — Z124 Encounter for screening for malignant neoplasm of cervix: Secondary | ICD-10-CM | POA: Diagnosis not present

## 2018-12-01 ENCOUNTER — Other Ambulatory Visit: Payer: Self-pay | Admitting: Family Medicine

## 2018-12-08 ENCOUNTER — Ambulatory Visit
Admission: RE | Admit: 2018-12-08 | Discharge: 2018-12-08 | Disposition: A | Discharge status: Home / Self Care | Payer: Medicare Other | Source: Ambulatory Visit | Attending: Family Medicine | Admitting: Family Medicine

## 2018-12-08 DIAGNOSIS — G459 Transient cerebral ischemic attack, unspecified: Secondary | ICD-10-CM

## 2018-12-08 DIAGNOSIS — R413 Other amnesia: Secondary | ICD-10-CM | POA: Diagnosis not present

## 2019-01-15 ENCOUNTER — Ambulatory Visit: Payer: Medicare Other | Admitting: Neurology

## 2019-01-15 ENCOUNTER — Encounter: Payer: Self-pay | Admitting: Neurology

## 2019-01-15 VITALS — BP 115/64 | HR 81 | Ht 64.0 in | Wt 140.0 lb

## 2019-01-15 DIAGNOSIS — R42 Dizziness and giddiness: Secondary | ICD-10-CM

## 2019-01-15 DIAGNOSIS — G4489 Other headache syndrome: Secondary | ICD-10-CM

## 2019-01-15 DIAGNOSIS — R519 Headache, unspecified: Secondary | ICD-10-CM

## 2019-01-15 DIAGNOSIS — R51 Headache: Secondary | ICD-10-CM

## 2019-01-15 DIAGNOSIS — R251 Tremor, unspecified: Secondary | ICD-10-CM | POA: Diagnosis not present

## 2019-01-15 DIAGNOSIS — Z818 Family history of other mental and behavioral disorders: Secondary | ICD-10-CM | POA: Diagnosis not present

## 2019-01-15 NOTE — Progress Notes (Signed)
[facility]    Provider:  Dr Jaynee Eagles Referring Provider: Hayden Rasmussen, MD Primary Care Physician:  Hayden Rasmussen, MD  CC:  Memory changes, headache, TIA  Interval history 01/15/2019: Very lovely patient is here for a new problem, she was referred by her primary care for TIA.  We have seen her in the past for other neurologic symptoms including memory loss and work-up did not show any neurodegenerative disorder.  She is also complained of tremor and imbalance, she has been extensively evaluated with MRI of the brain and MRI of the cervical spine.  She continued with memory complaints however PET scan very sensitive for Alzheimer's dementia was normal.  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  Patient says she was seeing her pcp for a routine examination. She told her pcp she had headaches that felt outside her skull. which resolved. Carotid Dopplers were fine. She feels fine.   Interval history 08/19/2018: Patient is back for neurologic symptoms, tremor and imbalance, she was prescribed propranolol in the past and did not take it and also physical therapy vestibular therapy.  MRI of the brain and MRI of the cervical spine show nothing acute. She has continued memory complaints, more short term memory issues.  Her grandfather and father had memory issues starting their 67s, unclear what diagnoses or type of dementia. She notes memory loss progression for years. More short-term memory. She can;t get words out, she knows the words but can;t get it out, she can be reticent in a conversation, she loses things at home, she misses bills but has never been good at that, wellbutrin helps, there has  been a question of ADD. She is not aware of telling the same stories, still driving not getting lost but she has taken a wrong turn going to a familiar place.    Interval history 03/31/2018: Patient was evaluated for tremor and imbalance. She was prescribed propranolol and never took it. She was prescribed PT but did not go as she sees a trainer 2x a week and takes yoga.  MRI of the brain and MRI of the cervical spine showed nothing acute.  She was seen by her nurse practitioner with multiple neurologic complaints including equilibrium, dizziness and vertiginous symptoms.  Lacunar infarct seen on CT of the head was possibly an expanded virtual robins space, writing sample did not indicate essential tremor nor did spiral drawing. She is still having difficulty with episodes of "shakiness" and was recently in the ED with dizziness. Still having difficulty swallowing pills. Will order a swallowing eval. Will order propranolol 30m as needed for tremor. She has been seeing mChief Executive Officerlast fall. Sh eis going to PT today and asked them to do vestibular therapy.   HPI:  Linda OSORTOis a 78y.o. female here as a referral from Dr. RDarron Doomfor a new consult. She feels her"equilibrium is off". She feels when she walks she has difficulty. She stubbed her foot on the carpet once. Started last summer, no inciting event or head trauma. No falls. Happens every day. Several times she has noticed a tremor. Usually when holding something. She just keeps saying she is "off", she can;t explain it it is just that she sometimes takes a wrong step or  an extra step to the right. No resting tremor. She wakes sometimes and can;t go back to sleep. Her first cousin has MS. No neuropathic disorders or neuromuscular disorders, no parkinson's disease in the family. Father had dementia in his mid 3s. 3x, not lately, her visual picture has "flipped up" for 30 seconds, no headache, no numbness, tingling, weakness. No  symptoms in the feet, no numbness or tingling in the feet. No loss of smell or taste. She has chronic double vision treated with lenses. Grandmother had tremors. No other focal neurologic deficits, associated symptoms, inciting events or modifiable factors.  Reviewed notes, labs and imaging from outside physicians, which showed:  Personally reviewed Ct head and agree with the following:  TSH normal, b12 normal in the past  IMPRESSION: 1. No acute intracranial abnormality. 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  Reviewed notes.  Patient was seen in May 2015 for tingling dysesthesias.  Constant tingling sensation in her feet and is slowly ascending.  Unclear what initially started on.  Onset was 2 years prior she first noticed it.  It started affecting her sleep and more difficult to fall asleep.  Also started noticing trouble with balance and fine motor skills especially during the dance class.  She developed some clamminess and some off-balance feeling.  She continued to be physically active.  Thyroid tests and vitamin D deficiencies were ruled out.  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  She was placed on a dental device for bruxism and snoring mild apnea.  She was tried on Requip.  Reviewed EMG nerve conduction study results and data, right upper extremity and both lower extremities showed no evidence of a peripheral neuropathy or lumbosacral radiculopathy.   Review of Systems: Patient complains of symptoms per HPI as well as the following symptoms: insomnia. Pertinent negatives and positives per HPI. All others negative.   Social History   Socioeconomic History  . Marital status: Widowed    Spouse name: Not on file  . Number of children: 3  . Years of education: College  . Highest education level: Not on file  Occupational History  . Not on file  Social Needs  .  Financial resource strain: Not on file  . Food insecurity:    Worry: Not on file    Inability: Not on file  . Transportation needs:    Medical: Not on file    Non-medical: Not on file  Tobacco Use  . Smoking status: Never Smoker  . Smokeless tobacco: Never Used  Substance and Sexual Activity  . Alcohol use: Yes    Comment: socially  . Drug use: No  . Sexual activity: Not on file  Lifestyle  . Physical activity:    Days per week: Not on file    Minutes per session: Not on file  . Stress: Not on file  Relationships  . Social connections:    Talks on phone: Not on file    Gets together: Not on file    Attends religious service: Not on file    Active member of club or organization: Not on file    Attends meetings of clubs or organizations: Not on file    Relationship status: Not on file  . Intimate partner violence:    Fear of current or ex partner: Not on file    Emotionally abused: Not on file    Physically abused: Not on  file    Forced sexual activity: Not on file  Other Topics Concern  . Not on file  Social History Narrative   Patient is a widow and lives alone with her dog.   Patient has three children.   Patient is retired.   Patient has a college education.   Patient is right-handed.   Caffeine: Patient drinks 1 cup of coffee 2-3 times per week       Family History  Problem Relation Age of Onset  . Dementia Father   . Colon cancer Maternal Grandmother   . Other Son        salivary gland "tumor"    Past Medical History:  Diagnosis Date  . Arthritis   . Chronic kidney disease   . Depression   . Hair loss    d/t propranolol  . Headache   . Leukopenia 08/12/2012  . Thyroid disease   . Tremor     Past Surgical History:  Procedure Laterality Date  . EXTRACORPOREAL SHOCK WAVE LITHOTRIPSY Left 04/25/2017   Procedure: LEFT EXTRACORPOREAL SHOCK WAVE LITHOTRIPSY (ESWL);  Surgeon: Raynelle Bring, MD;  Location: [facility];  Service: Urology;  Laterality: Left;  .  KNEE ARTHROSCOPY Left 2oyrs ago   L knee x2/ R knee x1  . LITHOTRIPSY  2012   difficulty voiding after procedure, d/c home with foley per pt  . TUBAL LIGATION    . WISDOM TOOTH EXTRACTION     teenager    Current Outpatient Medications  Medication Sig Dispense Refill  . aspirin EC 325 MG tablet Take 1 tablet (325 mg total) by mouth daily. 30 tablet 0  . buPROPion (WELLBUTRIN XL) 150 MG 24 hr tablet Take 450 mg by mouth daily.    Marland Kitchen esomeprazole (NEXIUM) 20 MG capsule Take 20 mg by mouth daily at 12 noon.    Marland Kitchen levothyroxine (LEVOTHROID) 25 MCG tablet Take 1 tablet (25 mcg total) by mouth daily. 90 tablet 3  . Multiple Vitamins-Minerals (MULTIVITAMIN PO) Take by mouth.     No current facility-administered medications for this visit.     Allergies as of 01/15/2019 - Review Complete 01/15/2019  Allergen Reaction Noted  . Erythromycin Swelling 11/13/2007  . Penicillins Swelling and Rash 11/13/2007    Vitals: BP 115/64 (BP Location: Right Arm, Patient Position: Sitting)   Pulse 81   Ht 5' 4"  (1.626 m)   Wt 140 lb (63.5 kg)   BMI 24.03 kg/m  Last Weight:  Wt Readings from Last 1 Encounters:  01/15/19 140 lb (63.5 kg)   Last Height:   Ht Readings from Last 1 Encounters:  01/15/19 5' 4"  (1.626 m)    Physical exam: Exam: Gen: NAD, conversant, well nourised, thin, well groomed                     CV: RRR, no MRG. No Carotid Bruits. No peripheral edema, warm, nontender Eyes: Conjunctivae clear without exudates or hemorrhage  Neuro: Detailed Neurologic Exam  Speech:    Speech is normal; fluent and spontaneous with normal comprehension.  Cognition:    The patient is oriented to person, place, and time;     recent and remote memory intact;     language fluent;     normal attention, concentration,     fund of knowledge Cranial Nerves:  MMSE - Mini Mental State Exam 08/19/2018  Orientation to time 4  Orientation to Place 5  Registration 3  Attention/ Calculation 5    Recall  3  Language- name 2 objects 2  Language- repeat 1  Language- follow 3 step command 3  Language- read & follow direction 1  Write a sentence 1  Copy design 1  Total score 29       The pupils are equal, round, and reactive to light. The fundi are normal and spontaneous venous pulsations are present. Visual fields are full to finger confrontation. Extraocular movements are intact. Trigeminal sensation is intact and the muscles of mastication are normal. The face is symmetric. The palate elevates in the midline. Hearing intact. Voice is normal. Shoulder shrug is normal. The tongue has normal motion without fasciculations.   Coordination:    Normal finger to nose and heel to shin. Normal rapid alternating movements.   Gait:    Heel-toe and tandem gait are normal.   Motor Observation:    No asymmetry, no atrophy, and no involuntary movements noted. Tone:    Normal muscle tone.    Posture:    Posture is normal. normal erect    Strength:    Strength is V/V in the upper and lower limbs.      Sensation: intact to LT     Reflex Exam:  DTR's:    Deep tendon reflexes in the upper and lower extremities are brisk bilaterally.   Toes:    The toes are downgoing bilaterally.   Clonus:    Clonus is absent.      Assessment/Plan:  Absolutely lovely 78 year old with resolved headache  - Headache resolved, no associated symptoms or deficits, neuro exam normal. MRO brain 12/08/2018: unchanged, some mild white matter changes, normal for age (better than normal for age), showed No acute intracranial abnormalities including mass lesion or mass effect, hydrocephalus, extra-axial fluid collection, midline shift, hemorrhage, or acute infarction, large ischemic events (personally reviewed images)   - Memory changes, continued progression over years, FHx of dementia in father and grandfather, described as short term memory loss and aphasia. FTD PEt scan negative for dementia which is a very  sensitive test. Reassured patient today, she looks great  -  MRI of the brain was unremarkable for etiology may have shown a lacunar infarct however also could be a virtual Robin space but this would not explain her symptoms and we discussed this with her and advised her to continue on aspirin prevention, watch her cholesterol blood pressure diabetes and other vascular risk factors. MRI cervical spine unremarkable. Reassured patient, her brain looks great.  - Episodes of shakiness: propranolol prn, made her loose hair so stopped. She is stable.  - PT in the past for imbalance, as well as dizziness vestibular symptoms, she goes to [facility] and has a Physiological scientist. Doing well.   Cc: Dr. Sonia Side, MD  [facility] [address] [place], [place] [address]  Phone [phone] Fax [phone]  A total of 25 minutes was spent face-to-face with this patient. Over half this time was spent on counseling patient on the  1. Nonintractable headache, unspecified chronicity pattern, unspecified headache type   2. Tremor   3. Other headache syndrome   4. FHx: dementia   5. Dizziness     and different diagnostic and therapeutic options, counseling and coordination of care, risks ans benefits of management, compliance, or risk factor reduction and education.

## 2019-01-18 ENCOUNTER — Encounter: Payer: Self-pay | Admitting: Neurology

## 2019-01-18 DIAGNOSIS — R51 Headache: Secondary | ICD-10-CM

## 2019-01-18 DIAGNOSIS — R251 Tremor, unspecified: Secondary | ICD-10-CM | POA: Insufficient documentation

## 2019-01-18 DIAGNOSIS — G4489 Other headache syndrome: Secondary | ICD-10-CM | POA: Insufficient documentation

## 2019-01-18 DIAGNOSIS — R519 Headache, unspecified: Secondary | ICD-10-CM | POA: Insufficient documentation

## 2019-01-18 DIAGNOSIS — Z818 Family history of other mental and behavioral disorders: Secondary | ICD-10-CM | POA: Insufficient documentation

## 2019-02-02 DIAGNOSIS — L718 Other rosacea: Secondary | ICD-10-CM | POA: Diagnosis not present

## 2019-02-02 DIAGNOSIS — L649 Androgenic alopecia, unspecified: Secondary | ICD-10-CM | POA: Diagnosis not present

## 2019-02-23 ENCOUNTER — Encounter

## 2019-02-23 ENCOUNTER — Encounter: Payer: Medicare Other | Admitting: Psychology

## 2019-03-12 ENCOUNTER — Ambulatory Visit: Payer: Medicare Other | Admitting: Nurse Practitioner

## 2019-05-19 DIAGNOSIS — H538 Other visual disturbances: Secondary | ICD-10-CM | POA: Diagnosis not present

## 2019-05-19 DIAGNOSIS — H5000 Unspecified esotropia: Secondary | ICD-10-CM | POA: Diagnosis not present

## 2019-05-19 DIAGNOSIS — H532 Diplopia: Secondary | ICD-10-CM | POA: Diagnosis not present

## 2019-06-11 DIAGNOSIS — L738 Other specified follicular disorders: Secondary | ICD-10-CM | POA: Diagnosis not present

## 2019-06-11 DIAGNOSIS — L649 Androgenic alopecia, unspecified: Secondary | ICD-10-CM | POA: Diagnosis not present

## 2019-06-11 DIAGNOSIS — B078 Other viral warts: Secondary | ICD-10-CM | POA: Diagnosis not present

## 2019-06-11 DIAGNOSIS — L309 Dermatitis, unspecified: Secondary | ICD-10-CM | POA: Diagnosis not present

## 2019-06-30 DIAGNOSIS — K5901 Slow transit constipation: Secondary | ICD-10-CM | POA: Diagnosis not present

## 2019-06-30 DIAGNOSIS — Z1211 Encounter for screening for malignant neoplasm of colon: Secondary | ICD-10-CM | POA: Diagnosis not present

## 2019-06-30 DIAGNOSIS — R933 Abnormal findings on diagnostic imaging of other parts of digestive tract: Secondary | ICD-10-CM | POA: Diagnosis not present

## 2019-06-30 DIAGNOSIS — K862 Cyst of pancreas: Secondary | ICD-10-CM | POA: Diagnosis not present

## 2019-07-08 ENCOUNTER — Other Ambulatory Visit: Payer: Self-pay | Admitting: Gastroenterology

## 2019-07-08 DIAGNOSIS — K869 Disease of pancreas, unspecified: Secondary | ICD-10-CM

## 2019-08-06 ENCOUNTER — Other Ambulatory Visit: Payer: Self-pay

## 2019-08-06 ENCOUNTER — Ambulatory Visit
Admission: RE | Admit: 2019-08-06 | Discharge: 2019-08-06 | Disposition: A | Discharge status: Home / Self Care | Payer: Medicare Other | Source: Ambulatory Visit | Attending: Gastroenterology | Admitting: Gastroenterology

## 2019-08-06 DIAGNOSIS — N289 Disorder of kidney and ureter, unspecified: Secondary | ICD-10-CM | POA: Diagnosis not present

## 2019-08-06 DIAGNOSIS — K869 Disease of pancreas, unspecified: Secondary | ICD-10-CM

## 2019-08-06 DIAGNOSIS — K769 Liver disease, unspecified: Secondary | ICD-10-CM | POA: Diagnosis not present

## 2019-08-06 DIAGNOSIS — K862 Cyst of pancreas: Secondary | ICD-10-CM | POA: Diagnosis not present

## 2019-08-06 MED ORDER — GADOBENATE DIMEGLUMINE 529 MG/ML IV SOLN
13.0000 mL | Freq: Once | INTRAVENOUS | Status: AC | PRN
  Administered 2019-08-06: 13 mL via INTRAVENOUS

## 2019-08-13 DIAGNOSIS — F329 Major depressive disorder, single episode, unspecified: Secondary | ICD-10-CM | POA: Diagnosis not present

## 2019-08-13 DIAGNOSIS — Z23 Encounter for immunization: Secondary | ICD-10-CM | POA: Diagnosis not present

## 2019-08-13 DIAGNOSIS — R4189 Other symptoms and signs involving cognitive functions and awareness: Secondary | ICD-10-CM | POA: Diagnosis not present

## 2019-08-13 DIAGNOSIS — H532 Diplopia: Secondary | ICD-10-CM | POA: Diagnosis not present

## 2019-09-01 DIAGNOSIS — Z23 Encounter for immunization: Secondary | ICD-10-CM | POA: Diagnosis not present

## 2019-09-01 DIAGNOSIS — H532 Diplopia: Secondary | ICD-10-CM | POA: Diagnosis not present

## 2019-09-01 DIAGNOSIS — F329 Major depressive disorder, single episode, unspecified: Secondary | ICD-10-CM | POA: Diagnosis not present

## 2019-09-01 DIAGNOSIS — R4189 Other symptoms and signs involving cognitive functions and awareness: Secondary | ICD-10-CM | POA: Diagnosis not present

## 2019-09-10 DIAGNOSIS — H532 Diplopia: Secondary | ICD-10-CM | POA: Diagnosis not present

## 2019-09-10 DIAGNOSIS — F329 Major depressive disorder, single episode, unspecified: Secondary | ICD-10-CM | POA: Diagnosis not present

## 2019-09-10 DIAGNOSIS — Z23 Encounter for immunization: Secondary | ICD-10-CM | POA: Diagnosis not present

## 2019-09-10 DIAGNOSIS — R4189 Other symptoms and signs involving cognitive functions and awareness: Secondary | ICD-10-CM | POA: Diagnosis not present

## 2019-09-18 DIAGNOSIS — H532 Diplopia: Secondary | ICD-10-CM | POA: Diagnosis not present

## 2019-09-18 DIAGNOSIS — F329 Major depressive disorder, single episode, unspecified: Secondary | ICD-10-CM | POA: Diagnosis not present

## 2019-09-18 DIAGNOSIS — R4189 Other symptoms and signs involving cognitive functions and awareness: Secondary | ICD-10-CM | POA: Diagnosis not present

## 2019-09-30 DIAGNOSIS — H5022 Vertical strabismus, left eye: Secondary | ICD-10-CM | POA: Diagnosis not present

## 2019-09-30 DIAGNOSIS — H2513 Age-related nuclear cataract, bilateral: Secondary | ICD-10-CM | POA: Diagnosis not present

## 2019-09-30 DIAGNOSIS — H532 Diplopia: Secondary | ICD-10-CM | POA: Diagnosis not present

## 2019-09-30 DIAGNOSIS — H04123 Dry eye syndrome of bilateral lacrimal glands: Secondary | ICD-10-CM | POA: Diagnosis not present

## 2019-11-17 DIAGNOSIS — L649 Androgenic alopecia, unspecified: Secondary | ICD-10-CM | POA: Diagnosis not present

## 2020-06-27 ENCOUNTER — Telehealth: Payer: Self-pay | Admitting: Neurology

## 2020-06-27 NOTE — Telephone Encounter (Signed)
I received a after-hours call message from this patient on 06/26/2020 at 1:36 PM.  I called her back at 1:37 PM.  Patient reported that she had a stroke a couple of years ago and had seen Dr. Lucia Gaskins for this.  She was concerned that she may have had another stroke.  She reported difficulty with her coordination and with her speech.  She did not sound dysarthric on the phone.  She was asking for an appointment first thing in the morning with Dr. Lucia Gaskins.  The patient was advised that if she has sudden onset of new neurological symptoms, she should not wait for an appointment as I could not promise an appointment right away.  She was advised to have someone take her to the emergency room immediately.  She reported that she was in [place].  She was with her son and his partner and indicated that he could take her to the emergency room.  He was also not familiar with the area, however, and the patient was therefore advised to call 911.  She was asking if she could come to [facility] which is about 2 hours away from where she was staying in [place].  She was advised not to have someone drive her 2 hours away.  She was advised to call 911 and the ambulance would take her to the nearest hospital which would be the safest thing to do.  She was advised that I would let her neurologist know.  She demonstrated understanding and agreement.  I reviewed her chart, no appointment is pending at this time.  Please review.

## 2020-06-27 NOTE — Telephone Encounter (Signed)
Mia Creek (Case Manager)  from [facility] in [place] called today about this situation. Patient is now at their hospital. She is requesting if there is any way that patient can be referred to maybe [facility] next door for speech therapy. She faxed over notes from her hospital visit. I placed them on NP's desk. Patient has an appointment with Shanda Bumps in the morning. FYI

## 2020-06-27 NOTE — Telephone Encounter (Signed)
Tori, have her follow up with Shanda Bumps first available set her up with an appointment. Unfortunately I agree she should be evaluated at the emergency room we cannot provide emergent visits here. In the meantime the ED and she hould see her primray care.

## 2020-06-28 ENCOUNTER — Other Ambulatory Visit: Payer: Self-pay

## 2020-06-28 ENCOUNTER — Encounter: Payer: Self-pay | Admitting: Adult Health

## 2020-06-28 ENCOUNTER — Ambulatory Visit: Payer: Medicare Other | Admitting: Adult Health

## 2020-06-28 ENCOUNTER — Inpatient Hospital Stay: Payer: Medicare Other | Admitting: Adult Health

## 2020-06-28 VITALS — BP 106/59 | HR 71 | Ht 64.0 in | Wt 130.0 lb

## 2020-06-28 DIAGNOSIS — R42 Dizziness and giddiness: Secondary | ICD-10-CM | POA: Diagnosis not present

## 2020-06-28 DIAGNOSIS — E871 Hypo-osmolality and hyponatremia: Secondary | ICD-10-CM | POA: Diagnosis not present

## 2020-06-28 DIAGNOSIS — Z8673 Personal history of transient ischemic attack (TIA), and cerebral infarction without residual deficits: Secondary | ICD-10-CM

## 2020-06-28 DIAGNOSIS — Z9189 Other specified personal risk factors, not elsewhere classified: Secondary | ICD-10-CM

## 2020-06-28 NOTE — Patient Instructions (Addendum)
Your Plan:  Please call office if interested in doing outpatient therapy and order will be placed  High suspecision that symptoms related to high dose of bupropion   Recommend decreasing bupropion dose to 300mg  daily - follow up with PCP for further instructions  Ensure follow up sodium levels with PCP at follow up   Follow up as needed or if symptoms worsen       Thank you for coming to see at [facility]. I hope we have been able to provide you high quality care today.  You may receive a patient satisfaction survey over the next few weeks. We would appreciate your feedback and comments so that we may continue to improve ourselves and the health of our patients.

## 2020-06-28 NOTE — Progress Notes (Addendum)
[facility]    Provider:  Dr Lucia GaskinsAhern Referring Provider: Dois Davenportichter, Karen L, MD Primary Care Physician:  Dois Davenportichter, Karen L, MD  CC: Strokelike symptoms  Today, 06/28/2020, Linda Macdonald returns per request due to worsening of prior stroke symptoms over the past 2 to 3 weeks.  Called the office and spoke to on-call provider Dr. Frances FurbishAthar with concerns of worsening disequilibrium, word finding difficulty and dysphagia.  She was advised to proceed to ED for further evaluation.  Personally reviewed [facility] from admission on 06/26/2020.  Full stroke work-up did not show any acute abnormalities.  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  She was found to have hyponatremia at 130 and felt likely due to excessive water intake and high dose of bupropion.  She was advised to follow-up with PCP for repeat lab work.  LDL 93 and recommended initiation of atorvastatin 10 mg daily.  On clopidogrel PTA and recommended continuation.  Blood pressure stable during admission.  Evaluated by therapies and recommended outpatient therapy at discharge.  Symptoms have been stable without worsening and possible slight improvement.  She has been monitoring fluid intake and is questioning decreasing bupropion dosage as high dose could potentially be causing side effects.  Prior stroke history with residual disequilibrium.  Review of prior office notes with complaints of disequilibrium, dizziness, tremor, headache and vertigo symptoms in 2019 as well as ongoing dysphagia and word finding difficulty.  Denies recent medication changes or recent illness.  Does report increased stressors and underlying depression without benefit of bupropion.  She does have follow-up with PCP next week.  Remains on clopidogrel without bleeding or bruising.  She is questioning need of atorvastatin as cholesterol levels stable.  Blood pressure today 106/59.  No further  concerns.    Per review of hospitalization notes:  [facility] with strokelike symptoms with difficulties finding words, slurred speech, dysphagia and gait disturbance.  Similar s/s 2 years prior.  Personally [facility] including labs and imaging.  Per review, history of prior stroke resulting in mild residual disequilibrium and experienced increased gait instability 1 week prior to evaluation after a fall landing in the grass hitting her head sustaining a hematoma.  The past 2 days prior to admission, experience word finding difficulties and dysphagia.  Denied headaches, visual disturbance, focal weakness or numbness.  CT head, CTA head/neck and MRI all unremarkable for acute abnormality with only evidence of small chronic bilateral basal ganglia infarctions.  LDL 93 initiate atorvastatin 10 mg daily.  A1c 5.5.  Vital stable during admission.  UDS and UA negative.  Symptoms possibly related to mild hyponatremia at 130 versus TIA therefore recommended continuation of clopidogrel and atorvastatin for secondary stroke prevention.  SLP eval: Delayed oral transit and prolonged chewing and delayed throat clearing and mild to moderate oral dysphagia present.  Suspecting LPR with dysphonia and risk of microaspiration risk.  Delayed word finding present with some failed attempts for compensation.  Cognition WNL.  Recommended outpatient SLP  PT eval: Outpatient PT for balance/vestibular assessment and training and use of AD as needed for functional activities      History provided for reference purposes only Interval history 01/15/2019: Very lovely patient is here for a new problem, she was referred by her primary care for TIA.  We have seen her in the past for other neurologic symptoms including memory loss and work-up did not show  any neurodegenerative disorder.  She is also complained of tremor and imbalance, she has been  extensively evaluated with MRI of the brain and MRI of the cervical spine.  She continued with memory complaints however PET scan very sensitive for Alzheimer's dementia was normal.  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  Patient says she was seeing her pcp for a routine examination. She told her pcp she had headaches that felt outside her skull. which resolved. Carotid Dopplers were fine. She feels fine.   Interval history 08/19/2018: Patient is back for neurologic symptoms, tremor and imbalance, she was prescribed propranolol in the past and did not take it and also physical therapy vestibular therapy.  MRI of the brain and MRI of the cervical spine show nothing acute. She has continued memory complaints, more short term memory issues.  Her grandfather and father had memory issues starting their 64s, unclear what diagnoses or type of dementia. She notes memory loss progression for years. More short-term memory. She can;t get words out, she knows the words but can;t get it out, she can be reticent in a conversation, she loses things at home, she misses bills but has never been good at that, wellbutrin helps, there has been a question of ADD. She is not aware of telling the same stories, still driving not getting lost but she has taken a wrong turn going to a familiar place.    Interval history 03/31/2018: Patient was evaluated for tremor and imbalance. She was prescribed propranolol and never took it. She was prescribed PT but did not go as she sees a trainer 2x a week and takes yoga.  MRI of the brain and MRI of the cervical spine showed nothing acute.  She was seen by her nurse practitioner with multiple neurologic  complaints including equilibrium, dizziness and vertiginous symptoms.  Lacunar infarct seen on CT of the head was possibly an expanded virtual robins space, writing sample did not indicate essential tremor nor did spiral drawing. She is still having difficulty with episodes of "shakiness" and was recently in the ED with dizziness. Still having difficulty swallowing pills. Will order a swallowing eval. Will order propranolol 10mg  as needed for tremor. She has been seeing last fall. Sh eis going to PT today and asked them to do vestibular therapy.    ALAN RILES is a 79 y.o. female here as a referral from Dr. 70 for a new consult. She feels her"equilibrium is off". She feels when she walks she has difficulty. She stubbed her foot on the carpet once. Started last summer, no inciting event or head trauma. No falls. Happens every day. Several times she has noticed a tremor. Usually when holding something. She just keeps saying she is "off", she can;t explain it it is just that she sometimes takes a wrong step or an extra step to the right. No resting tremor. She wakes sometimes and can;t go back to sleep. Her first cousin has MS. No neuropathic disorders or neuromuscular disorders, no parkinson's disease in the family. Father had dementia in his mid 66s. 3x, not lately, her visual picture has "flipped up" for 30 seconds, no headache, no numbness, tingling, weakness. No symptoms in the feet, no numbness or tingling in the feet. No  loss of smell or taste. She has chronic double vision treated with lenses. Grandmother had tremors. No other focal neurologic deficits, associated symptoms, inciting events or modifiable factors.  Reviewed notes, labs and imaging from outside physicians, which showed:  Personally reviewed Ct head and agree with the following:  TSH normal, b12 normal in the past  IMPRESSION: 1. No acute intracranial abnormality. Unchanged chronic lacunar infarct  in the left basal ganglia.  Reviewed notes.  Patient was seen in May 2015 for tingling dysesthesias.  Constant tingling sensation in her feet and is slowly ascending.  Unclear what initially started on.  Onset was 2 years prior she first noticed it.  It started affecting her sleep and more difficult to fall asleep.  Also started noticing trouble with balance and fine motor skills especially during the dance class.  She developed some clamminess and some off-balance feeling.  She continued to be physically active.  Thyroid tests and vitamin D deficiencies were ruled out.  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  She was placed on a dental device for bruxism and snoring mild apnea.  She was tried on Requip.  Reviewed EMG nerve conduction study results and data, right upper extremity and both lower extremities showed no evidence of a peripheral neuropathy or lumbosacral radiculopathy.   Review of Systems: Patient complains of symptoms per HPI as well as the following symptoms: insomnia. Pertinent negatives and positives per HPI. All others negative.   Social History   Socioeconomic History  . Marital status: Widowed    Spouse name: Not on file  . Number of children: 3  . Years of education: College  . Highest education level: Not on file  Occupational History  . Not on file  Tobacco Use  . Smoking status: Never Smoker  . Smokeless tobacco: Never Used  Vaping Use  . Vaping Use: Never used  Substance and Sexual Activity  . Alcohol use: Yes    Comment: socially  . Drug use: No  . Sexual activity: Not on file  Other Topics Concern  . Not on file  Social History Narrative   Patient is a widow and lives alone with her dog.   Patient has three children.   Patient is retired.   Patient has a college education.   Patient is right-handed.   Caffeine: Patient drinks 1 cup of coffee 2-3 times  per week      Social Determinants of Health   Financial Resource Strain:   . Difficulty of Paying Living Expenses:   Food Insecurity:   . Worried About Programme researcher, broadcasting/film/video in the Last Year:   . Barista in the Last Year:   Transportation Needs:   . Freight forwarder (Medical):   Marland Kitchen Lack of Transportation (Non-Medical):   Physical Activity:   . Days of Exercise per Week:   . Minutes of Exercise per Session:   Stress:   . Feeling of Stress :   Social Connections:   . Frequency of Communication with Friends and Family:   . Frequency of Social Gatherings with Friends and Family:   . Attends Religious Services:   . Active Member of Clubs or Organizations:   . Attends Banker Meetings:   Marland Kitchen Marital Status:   Intimate Partner Violence:   . Fear of Current or Ex-Partner:   . Emotionally Abused:   Marland Kitchen Physically Abused:   .  Sexually Abused:     Family History  Problem Relation Age of Onset  . Dementia Father   . Colon cancer Maternal Grandmother   . Other Son        salivary gland "tumor"    Past Medical History:  Diagnosis Date  . Arthritis   . Chronic kidney disease   . Depression   . Hair loss    d/t propranolol  . Headache   . Leukopenia 08/12/2012  . Thyroid disease   . Tremor     Past Surgical History:  Procedure Laterality Date  . EXTRACORPOREAL SHOCK WAVE LITHOTRIPSY Left 04/25/2017   Procedure: LEFT EXTRACORPOREAL SHOCK WAVE LITHOTRIPSY (ESWL);  Surgeon: Heloise Purpura, MD;  Location: [facility];  Service: Urology;  Laterality: Left;  . KNEE ARTHROSCOPY Left 2oyrs ago   L knee x2/ R knee x1  . LITHOTRIPSY  2012   difficulty voiding after procedure, d/c home with foley per pt  . TUBAL LIGATION    . WISDOM TOOTH EXTRACTION     teenager    Current Outpatient Medications  Medication Sig Dispense Refill  . buPROPion (WELLBUTRIN XL) 150 MG 24 hr tablet Take 300 mg by mouth daily.    . clopidogrel (PLAVIX) 75 MG tablet Take 75 mg by mouth  daily.    Marland Kitchen esomeprazole (NEXIUM) 20 MG capsule Take 20 mg by mouth daily at 12 noon.    Marland Kitchen levothyroxine (LEVOTHROID) 25 MCG tablet Take 1 tablet (25 mcg total) by mouth daily. 90 tablet 3  . Multiple Vitamins-Minerals (MULTIVITAMIN PO) Take by mouth.     No current facility-administered medications for this visit.    Allergies as of 06/28/2020 - Review Complete 01/15/2019  Allergen Reaction Noted  . Erythromycin Swelling 11/13/2007  . Penicillins Swelling and Rash 11/13/2007    Vitals: Today's Vitals   06/28/20 0803  BP: (!) 106/59  Pulse: 71  Weight: 130 lb (59 kg)  Height: 5\' 4"  (1.626 m)   Body mass index is 22.31 kg/m.   Physical exam: General: well developed, well nourished, pleasant elderly Caucasian female, seated, in no evident distress Head: head normocephalic and atraumatic.   Neck: supple with no carotid or supraclavicular bruits Cardiovascular: regular rate and rhythm, no murmurs Musculoskeletal: no deformity Skin:  no rash/petichiae Vascular:  Normal pulses all extremities   Neurologic Exam Mental Status: Awake and fully alert.   Delayed word finding and hesitation.  Unable to appreciate true aphasia or dysarthria.  Oriented to place and time. Recent and remote memory intact. Attention span, concentration and fund of knowledge appropriate. Mood and affect appropriate.  Cranial Nerves: Fundoscopic exam reveals sharp disc margins. Pupils equal, briskly reactive to light. Extraocular movements full without nystagmus. Visual fields full to confrontation. Hearing intact. Facial sensation intact. Face, tongue, palate moves normally and symmetrically.  Motor: Normal bulk and tone. Normal strength in all tested extremity muscles. Sensory.: intact to touch , pinprick , position and vibratory sensation.  Coordination: Rapid alternating movements normal in all extremities. Finger-to-nose and heel-to-shin performed accurately bilaterally. Gait and Station: Arises from chair  without difficulty. Stance is normal. Gait demonstrates normal stride length with mild imbalance.  Ambulates without AD.  Mild difficulty with tandem walk.  Romberg negative. Reflexes: 1+ and symmetric. Toes downgoing.        Assessment/Plan:  Linda Macdonald is a 79 y.o. female being seen today for strokelike symptoms  Longstanding complaint of dizziness, vertigo, tremors and headache with recent worsening of disequilibrium (prior stroke  residual deficit), word finding difficulty and dysphagia.  Established patient of Dr. Lucia Gaskins.    Full work-up at outside hospital negative for acute stroke.  Worsening of symptoms present over the past 2 to 3 weeks therefore not consistent with TIA or strokelike episode presentation  Ddx: Medication side effect vs hyponatremia vs recrudescence of prior stroke symptoms  Recommend decreasing bupropion dose from 450 mg daily to 300 mg daily Discussed appropriate water intake and to avoid excessive intake Has follow-up with PCP next week for repeat sodium levels and further discussion regarding bupropion She wishes to hold off on therapy at this time but will call office or discussed with PCP if interested in pursuing  Continue clopidogrel for secondary stroke prevention.  Patient hesitant to start atorvastatin and discussed indication with prior stroke history.  She plans on having repeat lipid panel at follow-up visit with PCP and will further discuss need of statin therapy.   Follow-up on an as-needed basis   I spent 40 minutes of face-to-face and non-face-to-face time with patient.  This included previsit chart review, lab review, study review, order entry, electronic health record documentation, review of outside hospital notes, discussion regarding symptom complaints and full work-up, possible etiologies and answered all questions to patient satisfaction   Ihor Austin, [facility]  [facility] [address] [place], [place] [address]  Phone [phone] Fax [phone] Note: This document was prepared with digital dictation and possible smart phrase technology. Any transcriptional errors that result from this process are unintentional.  Made any corrections needed, and agree with history, physical, neuro exam,assessment and plan as stated.     Linda Dean, MD [facility]

## 2020-07-03 NOTE — Telephone Encounter (Signed)
She was seen in the office by Shanda Bumps this is taken care of thanks

## 2021-01-10 ENCOUNTER — Ambulatory Visit: Payer: Medicare Other | Admitting: Podiatry

## 2021-01-17 ENCOUNTER — Ambulatory Visit (INDEPENDENT_AMBULATORY_CARE_PROVIDER_SITE_OTHER): Payer: Medicare Other

## 2021-01-17 ENCOUNTER — Other Ambulatory Visit: Payer: Self-pay

## 2021-01-17 ENCOUNTER — Encounter: Payer: Self-pay | Admitting: Podiatry

## 2021-01-17 ENCOUNTER — Ambulatory Visit: Payer: Medicare Other | Admitting: Podiatry

## 2021-01-17 DIAGNOSIS — R1310 Dysphagia, unspecified: Secondary | ICD-10-CM | POA: Insufficient documentation

## 2021-01-17 DIAGNOSIS — M2011 Hallux valgus (acquired), right foot: Secondary | ICD-10-CM | POA: Diagnosis not present

## 2021-01-17 DIAGNOSIS — M2041 Other hammer toe(s) (acquired), right foot: Secondary | ICD-10-CM

## 2021-01-17 DIAGNOSIS — Z860100 Personal history of colon polyps, unspecified: Secondary | ICD-10-CM | POA: Insufficient documentation

## 2021-01-17 DIAGNOSIS — K5901 Slow transit constipation: Secondary | ICD-10-CM | POA: Insufficient documentation

## 2021-01-17 DIAGNOSIS — M2042 Other hammer toe(s) (acquired), left foot: Secondary | ICD-10-CM

## 2021-01-17 DIAGNOSIS — Z800 Family history of malignant neoplasm of digestive organs: Secondary | ICD-10-CM | POA: Insufficient documentation

## 2021-01-17 DIAGNOSIS — K862 Cyst of pancreas: Secondary | ICD-10-CM | POA: Insufficient documentation

## 2021-01-17 DIAGNOSIS — K219 Gastro-esophageal reflux disease without esophagitis: Secondary | ICD-10-CM | POA: Insufficient documentation

## 2021-01-17 DIAGNOSIS — Z1211 Encounter for screening for malignant neoplasm of colon: Secondary | ICD-10-CM | POA: Insufficient documentation

## 2021-01-17 DIAGNOSIS — R933 Abnormal findings on diagnostic imaging of other parts of digestive tract: Secondary | ICD-10-CM | POA: Insufficient documentation

## 2021-01-17 NOTE — Progress Notes (Signed)
Subjective:  Patient ID: Linda Macdonald, female    DOB: 1941/09/08,  MRN: [id number] HPI Chief Complaint  Patient presents with  . Foot Pain    1st MPJ and 2nd toe right - bunion and hammertoe deformity x years, 2nd toe has a corn, trouble with shoe gear, likes to be active in ballroom dancing, but dress shoes hurt feet.  . New Patient (Initial Visit)    80 y.o. female presents with the above complaint.   ROS: She denies fever chills nausea vomiting muscle aches pains calf pain back pain chest pain shortness of breath.  Past Medical History:  Diagnosis Date  . Arthritis   . Chronic kidney disease   . Depression   . Hair loss    d/t propranolol  . Headache   . Leukopenia 08/12/2012  . Thyroid disease   . Tremor    Past Surgical History:  Procedure Laterality Date  . EXTRACORPOREAL SHOCK WAVE LITHOTRIPSY Left 04/25/2017   Procedure: LEFT EXTRACORPOREAL SHOCK WAVE LITHOTRIPSY (ESWL);  Surgeon: Heloise Purpura, MD;  Location: [facility];  Service: Urology;  Laterality: Left;  . KNEE ARTHROSCOPY Left 2oyrs ago   L knee x2/ R knee x1  . LITHOTRIPSY  2012   difficulty voiding after procedure, d/c home with foley per pt  . TUBAL LIGATION    . WISDOM TOOTH EXTRACTION     teenager    Current Outpatient Medications:  .  estradiol (ESTRACE) 0.1 MG/GM vaginal cream, INSERT 0.5G TWICE A WEEK FOR 3 MONTHS, Disp: , Rfl:  .  fluticasone (FLONASE) 50 MCG/ACT nasal spray, Place 1 spray into both nostrils daily., Disp: , Rfl:  .  levothyroxine (LEVOTHROID) 25 MCG tablet, Take 1 tablet (25 mcg total) by mouth daily., Disp: 90 tablet, Rfl: 3 .  Multiple Vitamins-Minerals (MULTIVITAMIN PO), Take by mouth., Disp: , Rfl:  .  spironolactone (ALDACTONE) 50 MG tablet, Take 50 mg by mouth daily., Disp: , Rfl:   Allergies  Allergen Reactions  . Erythromycin Swelling  . Penicillins Swelling and Rash   Review of Systems Objective:  There were no vitals filed for this visit.  General: Well  developed, nourished, in no acute distress, alert and oriented x3   Dermatological: Skin is warm, dry and supple bilateral. Nails x 10 are well maintained; remaining integument appears unremarkable at this time. There are no open sores, no preulcerative lesions, no rash or signs of infection present.  Vascular: Dorsalis Pedis artery and Posterior Tibial artery pedal pulses are 2/4 bilateral with immedate capillary fill time. Pedal hair growth present. No varicosities and no lower extremity edema present bilateral.   Neruologic: Grossly intact via light touch bilateral. Vibratory intact via tuning fork bilateral. Protective threshold with Semmes Wienstein monofilament intact to all pedal sites bilateral. Patellar and Achilles deep tendon reflexes 2+ bilateral. No Babinski or clonus noted bilateral.   Musculoskeletal: No gross boney pedal deformities bilateral. No pain, crepitus, or limitation noted with foot and ankle range of motion bilateral. Muscular strength 5/5 in all groups tested bilateral.  She has moderate to severe hammertoe deformity with complete dislocation of the second metatarsophalangeal joint of the right foot.  The toe has no integrity at the level of the metatarsophalangeal joint and frontal plane range of motion is demonstrates a flail toe.  She also has severe plantar flexor contracture deformity at the PIPJ and DIPJ that is mildly reducible.  Hallux valgus deformity is very reducible and does not demonstrate any crepitation or osteoarthritic change on  palpation.  Gait: Unassisted, Nonantalgic.    Radiographs:  Radiographs taken today demonstrate an osseously mature individual with minimal osteopenia bilateral foot.  Left foot appears to be relatively rectus and normal.  Very slight increase in the first intermetatarsal angle of the left foot.  Right foot does demonstrate significant hallux valgus deformity with an increase in the first intermetatarsal space and a complete  dislocation of an elongated second metatarsal at the metatarsophalangeal joint.  Lateral view does demonstrate severe hammertoe deformity of the second toe osteoarthritis appears to be present around the metatarsophalangeal joint second but medial deviation of the entire base of the proximal phalanx as opposed to just a abduction of the toe.  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  Hammertoe deformity rigid in nature right foot.  Plan: I discussed a plantar plate repair with her as well as a reduction in her first intermetatarsal angle.  She understands this and is amendable to it.  I am going to refer her to Dr. Lilian Kapur since he does this type of plantar plate repair.  She will follow-up with him in the near future.  I believe she would be a good surgical candidate.     Linda Macdonald Linda Macdonald, [place]

## 2021-02-20 ENCOUNTER — Ambulatory Visit: Payer: Medicare Other | Admitting: Podiatry

## 2021-02-20 ENCOUNTER — Other Ambulatory Visit: Payer: Self-pay

## 2021-02-20 DIAGNOSIS — M2011 Hallux valgus (acquired), right foot: Secondary | ICD-10-CM

## 2021-02-20 DIAGNOSIS — M2041 Other hammer toe(s) (acquired), right foot: Secondary | ICD-10-CM

## 2021-02-20 DIAGNOSIS — M21961 Unspecified acquired deformity of right lower leg: Secondary | ICD-10-CM

## 2021-02-20 DIAGNOSIS — M21611 Bunion of right foot: Secondary | ICD-10-CM

## 2021-02-20 NOTE — Patient Instructions (Signed)
The procedures we discussed on your right foot: "Minimally invasive" bunion correction, Weil osteotomy (shortening of 2nd metatarsal), hammertoe correction with repair of plantar plate (ligament on bottom of toe joint)

## 2021-02-23 NOTE — Progress Notes (Signed)
  Subjective:  Patient ID: Linda Macdonald, female    DOB: Jan 13, 1941,  MRN: [id number]  Chief Complaint  Patient presents with  . Hammer Toe    Right foot. Surgery consult     80 y.o. female presents with the above complaint. History confirmed with patient.   Objective:  Physical Exam: warm, good capillary refill, no trophic changes or ulcerative lesions, normal DP and PT pulses and normal sensory exam.   Right Foot: Hallux valgus with semireducible hammertoe deformity   Radiographs: X-ray of the right foot: Hallux valgus, hammertoe deformity, elongated second metatarsal Assessment:   1. Hammertoe of right foot   2. Hallux valgus with bunions, right   3. Deformity of metatarsal bone of right foot      Plan:  Patient was evaluated and treated and all questions answered.  Discussed the etiology and treatment including surgical and non surgical treatment for painful bunions and hammertoes.  She has exhausted all non surgical treatment prior to this visit including shoe gear changes and padding.  She desires surgical intervention. We discussed all risks including but not limited to: pain, swelling, infection, scar, numbness which may be temporary or permanent, chronic pain, stiffness, nerve pain or damage, wound healing problems, bone healing problems including delayed or non-union and recurrence. Specifically we discussed the following procedures: Minimally invasive bunionectomy, Weil and hammertoe correction of second with possible plantar plate repair or flexor tendon transfer.  She will return for follow-up when she is ready to schedule surgery  MICA bunion, Weil/HT/PPR of 2nd  No follow-ups on file.

## 2021-03-14 ENCOUNTER — Ambulatory Visit: Payer: Medicare Other | Admitting: Sports Medicine

## 2021-03-14 ENCOUNTER — Other Ambulatory Visit: Payer: Self-pay

## 2021-03-14 VITALS — BP 111/59 | Ht 63.5 in | Wt 140.0 lb

## 2021-03-14 DIAGNOSIS — M2011 Hallux valgus (acquired), right foot: Secondary | ICD-10-CM | POA: Diagnosis not present

## 2021-03-14 NOTE — Progress Notes (Addendum)
   PCP: Dois Davenport, MD  Subjective:   HPI: Patient is a 80 y.o. female here for nonsurgical interventions for her hammertoe and bunion.  Right foot bunion Ms. Vanepps reports that she was recently seen at podiatry for hammertoe of her second digit on her right foot in addition to the her right foot bunion.  She was offered surgical interventions at that time and wanted to know about any nonsurgical options.  These issues have been ongoing for years without causing significant issues.  She does note occasional knee or hip pain although these are bothersome at the moment.  She is interested in returning to ballroom dancing and felt that these foot issues needed to be addressed beforehand so they would not interfere.   Review of Systems:  Per HPI.        Objective:  Physical Exam: BP (!) 111/59   Ht 5' 3.5" (1.613 m)   Wt 140 lb (63.5 kg)   BMI 24.41 kg/m   Gen: awake, alert, NAD, comfortable in exam room Pulm: breathing unlabored  Foot: Inspection: Hammertoe deformity of her second toe in addition to bunion of the right first toe and callus formation on the medial aspect of the distal phalanx.  Callusing over dorsum of PIP 2nd toe  No erythema, or bruising.   Collapse of the forefoot arch. Palpation: No tenderness to palpation ROM: Full  ROM of the ankle. Normal midfoot flexibility Strength: 5/5 strength ankle in all planes Neurovascular: N/V intact distally in the lower extremity     Assessment & Plan:  1.  Hallux valgus and Hammertoe Right She appears to have collapse of her forefoot arch which is led to a worsening hammertoe of the second digit as a compensatory mechanism.  She is not interested in surgical interventions at this time.  We will attempt to treat conservatively with a metatarsal pad and a hammertoe pad on her right foot.  We have also provided a metatarsal pad for the left foot which is similar findings to a minor degree.  Follow-up in 1 month to  reassess.  Note gait was more comfortable with insoles in place  Mirian Mo, MD PGY-3  I observed and examined the patient with the [facility] resident and agree with assessment and plan.  Note reviewed and modified by me. Sterling Big, MD  03/14/2021 2:03 PM

## 2021-04-18 ENCOUNTER — Encounter: Payer: Self-pay | Admitting: Sports Medicine

## 2021-04-18 ENCOUNTER — Ambulatory Visit (INDEPENDENT_AMBULATORY_CARE_PROVIDER_SITE_OTHER): Payer: Medicare Other | Admitting: Sports Medicine

## 2021-04-18 ENCOUNTER — Other Ambulatory Visit: Payer: Self-pay

## 2021-04-18 VITALS — BP 123/54 | Ht 64.0 in | Wt 138.0 lb

## 2021-04-18 DIAGNOSIS — M2041 Other hammer toe(s) (acquired), right foot: Secondary | ICD-10-CM

## 2021-04-18 DIAGNOSIS — M2011 Hallux valgus (acquired), right foot: Secondary | ICD-10-CM | POA: Diagnosis not present

## 2021-04-18 NOTE — Progress Notes (Signed)
   PCP: Dois Davenport, MD  Subjective:   HPI: Patient is a 80 y.o. female here for follow-up on right foot pain.  At last visit on 03/14/2021, she was diagnosed with hallux valgus and hammertoe of the second toe with overlap on top of the great toe.  She was given a metatarsal pad for her second metatarsal, along with hammertoe stabilizer.  Since that visit, patient reports that the hammertoe stabilizer did not seem to work very well so she stopped using it.  Instead, she started taping her second toe to her third toe which she feels has been helpful.  She feels like the metatarsal pad has been extremely helpful as well.  In addition, she started using toe spacers in the evenings and has been doing toe yoga including stretching of her toes and especially straightening of her second toe.  She feels all this has been very helpful.  No new concerns.   Review of Systems:  Per HPI.   PMFSH, medications and smoking status reviewed.      Objective:  Physical Exam:  No flowsheet data found.  BP (!) 123/54   Ht 5\' 4"  (1.626 m)   Wt 138 lb (62.6 kg)   BMI 23.69 kg/m   Gen: awake, alert, NAD, comfortable in exam room Pulm: breathing unlabored  Right foot: -Inspection: With standing, patient has moderate hallux valgus bilaterally.  On her right foot, she has significant transverse arch collapse with splaying of the second and third toes, hammertoe ring of the second toe with overlap over the great toe.  She has some mild transverse arch collapse without any overlap on her left foot. -Palpation: No significant tenderness to palpation.  She does have flexion contracture at the second PIP joint of the right foot. -ROM: Full range of motion of the ankle and midfoot.  She has good range of motion of the first MTP joint.  Limited range of motion at the second PIP joint as noted above. -Normal strength and sensation   Assessment & Plan:  1.  Bilateral hallux valgus 2.  Right second toe  hammertoe  Patient is doing well with metatarsal pad and buddy taping.  We did discuss that she could try buddy taping her first second and third toe to keep them in alignment as well.  She is welcome to experiment with this.  Given that she is doing better with current therapy, we will plan to continue this for now, follow-up as needed.   , MD [facility] Fellow 04/18/2021 9:57 AM   I observed and examined the patient with the [facility] resident and agree with assessment and plan.  Note reviewed and modified by me. [facility], MD

## 2021-04-22 ENCOUNTER — Other Ambulatory Visit: Payer: Self-pay

## 2021-04-22 ENCOUNTER — Emergency Department (HOSPITAL_COMMUNITY): Payer: Medicare Other

## 2021-04-22 ENCOUNTER — Observation Stay (HOSPITAL_COMMUNITY)
Admission: EM | Admit: 2021-04-22 | Discharge: 2021-04-23 | Disposition: A | Discharge status: Home / Self Care | Payer: Medicare Other | Attending: Family Medicine | Admitting: Family Medicine

## 2021-04-22 ENCOUNTER — Encounter (HOSPITAL_COMMUNITY): Payer: Self-pay

## 2021-04-22 DIAGNOSIS — L659 Nonscarring hair loss, unspecified: Secondary | ICD-10-CM

## 2021-04-22 DIAGNOSIS — N132 Hydronephrosis with renal and ureteral calculous obstruction: Principal | ICD-10-CM | POA: Diagnosis present

## 2021-04-22 DIAGNOSIS — K219 Gastro-esophageal reflux disease without esophagitis: Secondary | ICD-10-CM | POA: Diagnosis not present

## 2021-04-22 DIAGNOSIS — N390 Urinary tract infection, site not specified: Secondary | ICD-10-CM | POA: Diagnosis not present

## 2021-04-22 DIAGNOSIS — M545 Low back pain, unspecified: Secondary | ICD-10-CM | POA: Diagnosis present

## 2021-04-22 DIAGNOSIS — Z79899 Other long term (current) drug therapy: Secondary | ICD-10-CM | POA: Diagnosis not present

## 2021-04-22 DIAGNOSIS — Z20822 Contact with and (suspected) exposure to covid-19: Secondary | ICD-10-CM | POA: Insufficient documentation

## 2021-04-22 DIAGNOSIS — Z7902 Long term (current) use of antithrombotics/antiplatelets: Secondary | ICD-10-CM | POA: Insufficient documentation

## 2021-04-22 DIAGNOSIS — N189 Chronic kidney disease, unspecified: Secondary | ICD-10-CM | POA: Insufficient documentation

## 2021-04-22 DIAGNOSIS — N23 Unspecified renal colic: Secondary | ICD-10-CM

## 2021-04-22 DIAGNOSIS — Z8673 Personal history of transient ischemic attack (TIA), and cerebral infarction without residual deficits: Secondary | ICD-10-CM | POA: Diagnosis not present

## 2021-04-22 DIAGNOSIS — E039 Hypothyroidism, unspecified: Secondary | ICD-10-CM | POA: Diagnosis present

## 2021-04-22 LAB — CBC WITH DIFFERENTIAL/PLATELET
Abs Immature Granulocytes: 0.05 10*3/uL (ref 0.00–0.07)
Basophils Absolute: 0 10*3/uL (ref 0.0–0.1)
Basophils Relative: 0 %
Eosinophils Absolute: 0 10*3/uL (ref 0.0–0.5)
Eosinophils Relative: 0 %
HCT: 42.8 % (ref 36.0–46.0)
Hemoglobin: 14.9 g/dL (ref 12.0–15.0)
Immature Granulocytes: 1 %
Lymphocytes Relative: 5 %
Lymphs Abs: 0.4 10*3/uL — ABNORMAL LOW (ref 0.7–4.0)
MCH: 33.6 pg (ref 26.0–34.0)
MCHC: 34.8 g/dL (ref 30.0–36.0)
MCV: 96.6 fL (ref 80.0–100.0)
Monocytes Absolute: 0.2 10*3/uL (ref 0.1–1.0)
Monocytes Relative: 2 %
Neutro Abs: 8 10*3/uL — ABNORMAL HIGH (ref 1.7–7.7)
Neutrophils Relative %: 92 %
Platelets: 205 10*3/uL (ref 150–400)
RBC: 4.43 MIL/uL (ref 3.87–5.11)
RDW: 12 % (ref 11.5–15.5)
WBC: 8.7 10*3/uL (ref 4.0–10.5)
nRBC: 0 % (ref 0.0–0.2)

## 2021-04-22 LAB — COMPREHENSIVE METABOLIC PANEL
ALT: 22 U/L (ref 0–44)
AST: 27 U/L (ref 15–41)
Albumin: 4.6 g/dL (ref 3.5–5.0)
Alkaline Phosphatase: 56 U/L (ref 38–126)
Anion gap: 7 (ref 5–15)
BUN: 27 mg/dL — ABNORMAL HIGH (ref 8–23)
CO2: 27 mmol/L (ref 22–32)
Calcium: 9.7 mg/dL (ref 8.9–10.3)
Chloride: 102 mmol/L (ref 98–111)
Creatinine, Ser: 0.98 mg/dL (ref 0.44–1.00)
GFR, Estimated: 59 mL/min — ABNORMAL LOW (ref >60)
Glucose, Bld: 149 mg/dL — ABNORMAL HIGH (ref 70–99)
Potassium: 4.1 mmol/L (ref 3.5–5.1)
Sodium: 136 mmol/L (ref 135–145)
Total Bilirubin: 0.6 mg/dL (ref 0.3–1.2)
Total Protein: 7.2 g/dL (ref 6.5–8.1)

## 2021-04-22 LAB — URINALYSIS, ROUTINE W REFLEX MICROSCOPIC
Bilirubin Urine: NEGATIVE
Glucose, UA: NEGATIVE mg/dL
Hgb urine dipstick: NEGATIVE
Ketones, ur: 5 mg/dL — AB
Nitrite: NEGATIVE
Protein, ur: NEGATIVE mg/dL
Specific Gravity, Urine: 1.015 (ref 1.005–1.030)
WBC, UA: >50 WBC/hpf — ABNORMAL HIGH (ref 0–5)
pH: 6 (ref 5.0–8.0)

## 2021-04-22 LAB — RESP PANEL BY RT-PCR (FLU A&B, COVID) ARPGX2
Influenza A by PCR: NEGATIVE
Influenza B by PCR: NEGATIVE
SARS Coronavirus 2 by RT PCR: NEGATIVE

## 2021-04-22 LAB — LIPASE, BLOOD: Lipase: 48 U/L (ref 11–51)

## 2021-04-22 MED ORDER — SODIUM CHLORIDE 0.9% FLUSH: 3.0000 mL | Freq: Two times a day (BID) | INTRAVENOUS | Status: DC

## 2021-04-22 MED ORDER — KETOROLAC TROMETHAMINE 30 MG/ML IJ SOLN
15.0000 mg | Freq: Once | INTRAMUSCULAR | Status: AC
  Administered 2021-04-22: 15 mg via INTRAVENOUS
  Filled 2021-04-22: qty 1

## 2021-04-22 MED ORDER — SODIUM CHLORIDE 0.9 % IV SOLN: Freq: Once | INTRAVENOUS | Status: AC

## 2021-04-22 MED ORDER — MORPHINE SULFATE (PF) 4 MG/ML IV SOLN
4.0000 mg | Freq: Once | INTRAVENOUS | Status: AC
Start: 2021-04-22 — End: 2021-04-22
  Administered 2021-04-22: 4 mg via INTRAVENOUS
  Filled 2021-04-22: qty 1

## 2021-04-22 MED ORDER — ACETAMINOPHEN 325 MG PO TABS: 650.0000 mg | ORAL_TABLET | Freq: Four times a day (QID) | ORAL | Status: DC | PRN

## 2021-04-22 MED ORDER — LEVOTHYROXINE SODIUM 25 MCG PO TABS
25.0000 ug | ORAL_TABLET | Freq: Every day | ORAL | Status: DC
  Administered 2021-04-23: 25 ug via ORAL
  Filled 2021-04-22: qty 1

## 2021-04-22 MED ORDER — SODIUM CHLORIDE 0.9 % IV BOLUS
1000.0000 mL | Freq: Once | INTRAVENOUS | Status: AC
  Administered 2021-04-22: 1000 mL via INTRAVENOUS

## 2021-04-22 MED ORDER — POLYETHYLENE GLYCOL 3350 17 G PO PACK: 17.0000 g | PACK | Freq: Every day | ORAL | Status: DC | PRN

## 2021-04-22 MED ORDER — SODIUM CHLORIDE 0.9 % IV SOLN
1.0000 g | INTRAVENOUS | Status: DC
  Filled 2021-04-22: qty 10

## 2021-04-22 MED ORDER — ACETAMINOPHEN 650 MG RE SUPP: 650.0000 mg | Freq: Four times a day (QID) | RECTAL | Status: DC | PRN

## 2021-04-22 MED ORDER — ONDANSETRON HCL 4 MG/2ML IJ SOLN
4.0000 mg | Freq: Once | INTRAMUSCULAR | Status: AC
Start: 2021-04-22 — End: 2021-04-22
  Administered 2021-04-22: 4 mg via INTRAVENOUS
  Filled 2021-04-22: qty 2

## 2021-04-22 MED ORDER — ONDANSETRON 4 MG PO TBDP: 4.0000 mg | ORAL_TABLET | Freq: Once | ORAL | Status: DC

## 2021-04-22 MED ORDER — KETOROLAC TROMETHAMINE 15 MG/ML IJ SOLN: 15.0000 mg | Freq: Four times a day (QID) | INTRAMUSCULAR | Status: DC | PRN

## 2021-04-22 MED ORDER — SODIUM CHLORIDE 0.9 % IV SOLN
1.0000 g | Freq: Once | INTRAVENOUS | Status: AC
  Administered 2021-04-22: 1 g via INTRAVENOUS
  Filled 2021-04-22: qty 10

## 2021-04-22 MED ORDER — SODIUM CHLORIDE 0.9 % IV SOLN: INTRAVENOUS | Status: AC

## 2021-04-22 NOTE — ED Provider Notes (Signed)
[facility]-EMERGENCY DEPT Provider Note   CSN: [id number] Arrival date & time: 04/22/21  1329     History Chief Complaint  Patient presents with  . Back Pain  . Emesis    Linda Macdonald is a 80 y.o. female.  80 year old female with prior medical history as detailed below presents for evaluation.  Patient complains of diffuse low back discomfort with associated nausea and vomiting.  Symptoms began significantly over this morning.  She reports that her discomfort was significantly worse after 9 AM.  She reports recent travel/flight from [place].  She threw up multiple times on the flight.  She reports prior history of renal colic.  She reports that nausea is typical of her renal colic.  She is known to [facility] urology with prior history of lithotripsy reported.  She denies fever.  She reports that the pain has now moved to the suprapubic area as well.  The history is provided by the patient and medical records.  Illness Location:  Acute onset low back and suprapubic discomfort Severity:  Moderate Onset quality:  Sudden Duration:  8 hours Timing:  Constant Progression:  Worsening Chronicity:  New Associated symptoms: abdominal pain and vomiting   Associated symptoms: no fatigue        Past Medical History:  Diagnosis Date  . Arthritis   . Chronic kidney disease   . Depression   . Hair loss    d/t propranolol  . Headache   . Leukopenia 08/12/2012  . Thyroid disease   . Tremor     Patient Active Problem List   Diagnosis Date Noted  . Abnormal finding on GI tract imaging 01/17/2021  . Colon cancer screening 01/17/2021  . Dysphagia 01/17/2021  . Family history of malignant neoplasm of gastrointestinal tract 01/17/2021  . Gastroesophageal reflux disease 01/17/2021  . 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  . Personal history of colonic polyps 01/17/2021  . Slow transit constipation 01/17/2021  . Other headache syndrome 01/18/2019  . FHx: dementia  01/18/2019  . Headache   . Tremor   . Complaints of memory disturbance 08/20/2018  . Dizziness 03/10/2018  . Benign essential tremor 03/10/2018  . Disequilibrium 03/10/2018  . Lacunar stroke (HCC) 02/18/2018  . Dysesthesia of multiple sites 03/19/2014  . Restless legs syndrome (RLS) 03/19/2014  . Anemia associated with nutritional deficiency 03/19/2014  . Hypersomnia with sleep apnea, unspecified 03/19/2014  . Deep venous thrombosis of distal leg (HCC) 10/22/2012  . Medial meniscus tear 10/22/2012  . Pseudogout 10/22/2012  . Acute medial meniscus tear of left knee 10/16/2012  . Leukopenia 08/12/2012  . Iritis, primary, acute/subacute 06/16/2012  . Posterior vitreous detachment, right eye 06/09/2012  . CALCULUS OF KIDNEY 10/06/2010  . WEIGHT LOSS 08/10/2010  . DIARRHEA 08/10/2010  . ABDOMINAL PAIN RIGHT LOWER QUADRANT 08/10/2010  . ERUCTATION 04/18/2010  . HYPERSOMNIA 01/24/2010  . 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 VENOUS INSUFFICIENCY, LEGS 08/29/2009  . HYPOTHYROIDISM 04/21/2008  . DEPRESSION 04/21/2008  . SLEEP APNEA, OBSTRUCTIVE 04/21/2008  . DEEP VENOUS THROMBOPHLEBITIS, LEFT, LEG, HX OF 04/21/2008  . WISDOM TEETH EXTRACTION, HX OF 04/21/2008    Past Surgical History:  Procedure Laterality Date  . EXTRACORPOREAL SHOCK WAVE LITHOTRIPSY Left 04/25/2017   Procedure: LEFT EXTRACORPOREAL SHOCK WAVE LITHOTRIPSY (ESWL);  Surgeon: Heloise Purpura, MD;  Location: [facility] ORS;  Service: Urology;  Laterality: Left;  . KNEE ARTHROSCOPY Left 2oyrs ago   L knee x2/ R knee x1  . LITHOTRIPSY  2012   difficulty  voiding after procedure, d/c home with foley per pt  . TUBAL LIGATION    . WISDOM TOOTH EXTRACTION     teenager     OB History   No obstetric history on file.     Family History  Problem Relation Age of Onset  . Dementia Father   . Colon cancer Maternal Grandmother   . Other Son        salivary gland "tumor"    Social History   Tobacco Use  . Smoking  status: Never Smoker  . Smokeless tobacco: Never Used  Vaping Use  . Vaping Use: Never used  Substance Use Topics  . Alcohol use: Yes    Comment: socially  . Drug use: No    Home Medications Prior to Admission medications   Medication Sig Start Date End Date Taking? Authorizing Provider  estradiol (ESTRACE) 0.1 MG/GM vaginal cream INSERT 0.5G TWICE A WEEK FOR 3 MONTHS 12/14/20   [provider]  fluticasone (FLONASE) 50 MCG/ACT nasal spray Place 1 spray into both nostrils daily. 01/12/21   [provider]  levothyroxine (LEVOTHROID) 25 MCG tablet Take 1 tablet (25 mcg total) by mouth daily. 04/02/11 03/25/26  Norins, Rosalyn GessMichael E, MD  Multiple Vitamins-Minerals (MULTIVITAMIN PO) Take by mouth.    [provider]  spironolactone (ALDACTONE) 50 MG tablet Take 50 mg by mouth daily. 12/09/20   [provider]    Allergies    Erythromycin and Penicillins  Review of Systems   Review of Systems  Constitutional: Negative for fatigue.  Gastrointestinal: Positive for abdominal pain and vomiting.  All other systems reviewed and are negative.   Physical Exam Updated Vital Signs BP (!) 149/62   Pulse 70   Temp 97.8 F (36.6 C) (Oral)   Resp 16   Ht 5\' 4"  (1.626 m)   Wt 62.6 kg   SpO2 100%   BMI 23.69 kg/m   Physical Exam Vitals and nursing note reviewed.  Constitutional:      General: She is not in acute distress.    Appearance: Normal appearance. She is well-developed.  HENT:     Head: Normocephalic and atraumatic.  Eyes:     Conjunctiva/sclera: Conjunctivae normal.     Pupils: Pupils are equal, round, and reactive to light.  Cardiovascular:     Rate and Rhythm: Normal rate and regular rhythm.     Heart sounds: Normal heart sounds.  Pulmonary:     Effort: Pulmonary effort is normal. No respiratory distress.     Breath sounds: Normal breath sounds.  Abdominal:     General: There is no distension.     Palpations: Abdomen is soft.      Tenderness: There is no abdominal tenderness.  Musculoskeletal:        General: No deformity. Normal range of motion.     Cervical back: Normal range of motion and neck supple.  Skin:    General: Skin is warm and dry.  Neurological:     General: No focal deficit present.     Mental Status: She is alert and oriented to person, place, and time.     ED Results / Procedures / Treatments   Labs (all labs ordered are listed, but only abnormal results are displayed) Labs Reviewed  CBC WITH DIFFERENTIAL/PLATELET - Abnormal; Notable for the following components:      Result Value   Neutro Abs 8.0 (*)    Lymphs Abs 0.4 (*)    All other components within normal  limits  COMPREHENSIVE METABOLIC PANEL - Abnormal; Notable for the following components:   Glucose, Bld 149 (*)    BUN 27 (*)    GFR, Estimated 59 (*)    All other components within normal limits  URINALYSIS, ROUTINE W REFLEX MICROSCOPIC - Abnormal; Notable for the following components:   APPearance HAZY (*)    Ketones, ur 5 (*)    Leukocytes,Ua LARGE (*)    WBC, UA >50 (*)    Bacteria, UA RARE (*)    All other components within normal limits  RESP PANEL BY RT-PCR (FLU A&B, COVID) ARPGX2  LIPASE, BLOOD    EKG None  Radiology CT Renal Stone Study  Result Date: 04/22/2021 CLINICAL DATA:  Lower abdominal and back pain with vomiting. EXAM: CT ABDOMEN AND PELVIS WITHOUT CONTRAST TECHNIQUE: Multidetector CT imaging of the abdomen and pelvis was performed following the standard protocol without IV contrast. COMPARISON:  MRI abdomen dated August 05, 2021. CT abdomen pelvis dated Apr 09, 2017. FINDINGS: Lower chest: No acute abnormality. Bibasilar linear atelectasis/scarring. Hepatobiliary: No focal liver abnormality is seen. No gallstones, gallbladder wall thickening, or biliary dilatation. Pancreas: Unchanged 1.4 cm low-density lesion in the pancreatic head. No ductal dilatation or surrounding inflammatory changes. Spleen: Normal in  size without focal abnormality. Adrenals/Urinary Tract: Adrenal glands are unremarkable. There are three nonobstructive left renal calculi measuring up to 3 mm. Multiple, at least six, punctate calculi in the mid and lower poles of the right kidney. 1.2 cm calculus in the distal right ureter just proximal to the UVJ with resultant moderate to severe hydroureteronephrosis. Mild right perinephric fat stranding. Subcentimeter low-density lesion in the lower pole of the left kidney is too small to characterize. The bladder is decompressed. Stomach/Bowel: Stomach is within normal limits. Appendix appears normal. No evidence of bowel wall thickening, obstruction, or inflammatory changes. The cecum is distended with stool. Vascular/Lymphatic: Aortic atherosclerosis. No enlarged abdominal or pelvic lymph nodes. Reproductive: Uterus and bilateral adnexa are unremarkable. Pessary device in place. Other: Trace free fluid in the pelvis.  No pneumoperitoneum. Musculoskeletal: No acute or significant osseous findings. IMPRESSION: 1. 1.2 cm calculus in the distal right ureter just proximal to the UVJ with resultant moderate to severe hydroureteronephrosis. 2. Additional bilateral nonobstructive nephrolithiasis. 3. Unchanged 1.4 cm low-density lesion in the pancreatic head, stable since prior MRI from August 2020, favoring benignity. 4. Aortic Atherosclerosis (ICD10-I70.0). Electronically Signed   By: Obie Dredge M.D.   On: 04/22/2021 15:06    Procedures Procedures   Medications Ordered in ED Medications  cefTRIAXone (ROCEPHIN) 1 g in sodium chloride 0.9 % 100 mL IVPB (1 g Intravenous New Bag/Given 04/22/21 2255)  sodium chloride 0.9 % bolus 1,000 mL (0 mLs Intravenous Stopped 04/22/21 1810)  ondansetron (ZOFRAN) injection 4 mg (4 mg Intravenous Given 04/22/21 1715)  morphine 4 MG/ML injection 4 mg (4 mg Intravenous Given 04/22/21 1715)  ketorolac (TORADOL) 30 MG/ML injection 15 mg (15 mg Intravenous Given 04/22/21 1715)   0.9 %  sodium chloride infusion ( Intravenous New Bag/Given 04/22/21 1822)    ED Course  I have reviewed the triage vital signs and the nursing notes.  Pertinent labs & imaging results that were available during my care of the patient were reviewed by me and considered in my medical decision making (see chart for details).    MDM Rules/Calculators/A&P  MDM  MSE complete  Linda Macdonald was evaluated in Emergency Department on 04/22/2021 for the symptoms described in the history of present illness. She was evaluated in the context of the global COVID-19 pandemic, which necessitated consideration that the patient might be at risk for infection with the SARS-CoV-2 virus that causes COVID-19. Institutional protocols and algorithms that pertain to the evaluation of patients at risk for COVID-19 are in a state of rapid change based on information released by regulatory bodies including the CDC and federal and state organizations. These policies and algorithms were followed during the patient's care in the ED.  Patient is presenting with acute renal colic.  UA is suggestive of perhaps early infection.  Dr. Liliane Shi (Urology) is aware of case.  Agrees with plan for admission for IV antibiotics.  Tentative plan for urologic intervention tomorrow.  Hospitalist service is aware of case and will evaluate for admission.  Final Clinical Impression(s) / ED Diagnoses Final diagnoses:  Renal colic    Rx / DC Orders ED Discharge Orders    None       Wynetta Fines, MD 04/22/21 2326

## 2021-04-22 NOTE — ED Triage Notes (Signed)
Patient c/o bilateral lower back pain and emesis since 0900 today. Patient unsure about hematuria, but denies frequency. patient also c/o lower abdominal pain.

## 2021-04-22 NOTE — ED Notes (Signed)
Pt unable to urinate provider aware

## 2021-04-22 NOTE — ED Notes (Signed)
Pt ambulated to the bathroom to void.   

## 2021-04-22 NOTE — ED Notes (Signed)
Pt. Refuses to blood draw to go to the restroom. Pt. Is wanting a nurse to draw her blood. Nurse aware.

## 2021-04-22 NOTE — ED Notes (Signed)
Pt in bed resting , respirations even and unlabored. Denies any pain at this time.

## 2021-04-22 NOTE — ED Provider Notes (Signed)
Emergency Medicine Provider Triage Evaluation Note  Linda Macdonald , a 80 y.o. female  was evaluated in triage.  Pt complains of bilateral lumbar back pain.  Pain began this morning approximately 9 AM.  Patient also endorses nausea.  Patient has vomited 8 times in the last 24 hours.  Patient describes emesis as bilious.  Patient denies any bloody emesis or coffee-ground emesis.  Patient states that she has a history of renal calculi and has had lithotripsy twice in the past.  She also endorses lower abdominal pain.  Patient rates her pain 9/10 on the pain scale.  Patient denies any dysuria is unsure if she had any hematuria.    Review of Systems  Positive: Abdominal pain, lumbar back pain, nausea, vomiting Negative: Fever, chills, constipation, diarrhea  Physical Exam  BP (!) 122/58 (BP Location: Left Arm)   Pulse 64   Temp 97.8 F (36.6 C) (Oral)   Resp 16   SpO2 100%  Gen:   Awake, no distress   Resp:  Normal effort  MSK:   Moves extremities without difficulty  Other:  Abdomen soft, nondistended, tenderness to periumbilical region, positive CVA tenderness bilaterally  Medical Decision Making  Medically screening exam initiated at 1:50 PM.  Appropriate orders placed.  [name] was informed that the remainder of the evaluation will be completed by another provider, this initial triage assessment does not replace that evaluation, and the importance of remaining in the ED until their evaluation is complete.  The patient appears stable so that the remainder of the work up may be completed by another provider.      Haskel Schroeder, PA-C 04/22/21 1354    Lorre Nick, MD 04/23/21 [phone]

## 2021-04-22 NOTE — H&P (Signed)
History and Physical   DEVONNE KITCHEN HKV:[id number] DOB: 09-04-1941 DOA: 04/22/2021  PCP: Dois Davenport, MD   Patient coming from: Home  Chief Complaint: Back pain  HPI: Linda Macdonald is a 80 y.o. female with medical history significant of anemia, essential tremor, prior renal calculi, history of DVT, hairloss, depression, GERD, hypothyroidism, CVA, RLS, OSA, CKD who presents with low back pain for the past day.  She states that she had a flight home from [place] this morning and she began to have nausea and vomiting during the flight.  She also started to have some pain later on.  He states that the severity of the pain increased midday and the hospital was on her way home so she decided to come in to be seen.  She states that she typically has nausea and vomiting with her renal stones but she has not had as significant pain before.  Pain initially rated as an 8-9 out of 10. She did begin to have some radiation into her suprapubic region as well. Pain is improved in the ED after analgesics.   She has fevers, chills, chest pain, shortness of breath,, constipation, diarrhea.  ED Course: Vital signs in the ED were stable blood pressure ranging in the 100s to 140 systolic.  Lab work-up showed CMP with BUN 27, glucose 149.  CBC within normal limits.  Lipase normal.  Respiratory panel for flu and COVID-negative.  Urinalysis showed leukocytes, white cells and blood concerning for early UTI.  Patient started on ceftriaxone and given IV fluids as well as pain control in the ED.  Urology was consulted and recommend n.p.o. at midnight with plan for urologic procedure tomorrow.  Review of Systems: As per HPI otherwise all other systems reviewed and are negative.  Past Medical History:  Diagnosis Date  . Arthritis   . Chronic kidney disease   . Depression   . Hair loss    d/t propranolol  . Headache   . Leukopenia 08/12/2012  . Thyroid disease   . Tremor     Past Surgical History:   Procedure Laterality Date  . EXTRACORPOREAL SHOCK WAVE LITHOTRIPSY Left 04/25/2017   Procedure: LEFT EXTRACORPOREAL SHOCK WAVE LITHOTRIPSY (ESWL);  Surgeon: Heloise Purpura, MD;  Location: [facility];  Service: Urology;  Laterality: Left;  . KNEE ARTHROSCOPY Left 2oyrs ago   L knee x2/ R knee x1  . LITHOTRIPSY  2012   difficulty voiding after procedure, d/c home with foley per pt  . TUBAL LIGATION    . WISDOM TOOTH EXTRACTION     teenager    Social History  reports that she has never smoked. She has never used smokeless tobacco. She reports current alcohol use. She reports that she does not use drugs.  Allergies  Allergen Reactions  . Erythromycin Swelling  . Penicillins Swelling and Rash    Family History  Problem Relation Age of Onset  . Dementia Father   . Colon cancer Maternal Grandmother   . Other Son        salivary gland "tumor"  Reviewed on admission  Prior to Admission medications   Medication Sig Start Date End Date Taking? Authorizing Provider  Biotin w/ Vitamins C & E (HAIR/SKIN/NAILS PO) Take 1 packet by mouth at bedtime.   Yes [provider]  clopidogrel (PLAVIX) 75 MG tablet Take 1 tablet by mouth daily. 02/03/21  Yes [provider]  estradiol (ESTRACE) 0.1 MG/GM vaginal cream Place 1 Applicatorful vaginally 2 (two) times  a week. 12/14/20  Yes [provider]  famotidine (PEPCID) 40 MG tablet Take 40 mg by mouth 2 (two) times daily. 02/16/21  Yes [provider]  levothyroxine (LEVOTHROID) 25 MCG tablet Take 1 tablet (25 mcg total) by mouth daily. 04/02/11 03/25/26 Yes Norins, Rosalyn Gess, MD  Multiple Vitamins-Minerals (MULTIVITAMIN PO) Take 1 tablet by mouth daily.   Yes [provider]  spironolactone (ALDACTONE) 50 MG tablet Take 50 mg by mouth daily. 12/09/20  Yes [provider]    Physical Exam: Vitals:   04/22/21 2000 04/22/21 2100 04/22/21 2206 04/23/21 0014  BP: (!) 119/52 (!) 105/51 124/65 (!) 108/47   Pulse: 72 71 74 70  Resp: 16 16 17 14   Temp:    97.7 F (36.5 C)  TempSrc:    Oral  SpO2: 98% 96% 97% 97%  Weight:      Height:       Physical Exam Constitutional:      General: She is not in acute distress.    Appearance: Normal appearance.  HENT:     Head: Normocephalic and atraumatic.     Mouth/Throat:     Mouth: Mucous membranes are moist.     Pharynx: Oropharynx is clear.  Eyes:     Extraocular Movements: Extraocular movements intact.     Pupils: Pupils are equal, round, and reactive to light.  Cardiovascular:     Rate and Rhythm: Normal rate and regular rhythm.     Pulses: Normal pulses.     Heart sounds: Normal heart sounds.  Pulmonary:     Effort: Pulmonary effort is normal. No respiratory distress.     Breath sounds: Normal breath sounds.  Abdominal:     General: Bowel sounds are normal. There is no distension.     Palpations: Abdomen is soft.     Tenderness: There is no abdominal tenderness.  Musculoskeletal:        General: No swelling or deformity.  Skin:    General: Skin is warm and dry.  Neurological:     General: No focal deficit present.     Mental Status: Mental status is at baseline.    Labs on Admission: I have personally reviewed following labs and imaging studies  CBC: Recent Labs  Lab 04/22/21 1355  WBC 8.7  NEUTROABS 8.0*  HGB 14.9  HCT 42.8  MCV 96.6  PLT 205    Basic Metabolic Panel: Recent Labs  Lab 04/22/21 1355  NA 136  K 4.1  CL 102  CO2 27  GLUCOSE 149*  BUN 27*  CREATININE 0.98  CALCIUM 9.7    GFR: Estimated Creatinine Clearance: 40.2 mL/min (by C-G formula based on SCr of 0.98 mg/dL).  Liver Function Tests: Recent Labs  Lab 04/22/21 1355  AST 27  ALT 22  ALKPHOS 56  BILITOT 0.6  PROT 7.2  ALBUMIN 4.6    Urine analysis:    Component Value Date/Time   COLORURINE YELLOW 04/22/2021 2207   APPEARANCEUR HAZY (A) 04/22/2021 2207   LABSPEC 1.015 04/22/2021 2207   PHURINE 6.0 04/22/2021 2207   GLUCOSEU  NEGATIVE 04/22/2021 2207   GLUCOSEU NEGATIVE 05/04/2010 1058   HGBUR NEGATIVE 04/22/2021 2207   BILIRUBINUR NEGATIVE 04/22/2021 2207   KETONESUR 5 (A) 04/22/2021 2207   PROTEINUR NEGATIVE 04/22/2021 2207   UROBILINOGEN 0.2 05/04/2010 1058   NITRITE NEGATIVE 04/22/2021 2207   LEUKOCYTESUR LARGE (A) 04/22/2021 2207    Radiological Exams on Admission: CT Renal Stone Study  Result Date: 04/22/2021  CLINICAL DATA:  Lower abdominal and back pain with vomiting. EXAM: CT ABDOMEN AND PELVIS WITHOUT CONTRAST TECHNIQUE: Multidetector CT imaging of the abdomen and pelvis was performed following the standard protocol without IV contrast. COMPARISON:  MRI abdomen dated August 05, 2021. CT abdomen pelvis dated Apr 09, 2017. FINDINGS: Lower chest: No acute abnormality. Bibasilar linear atelectasis/scarring. Hepatobiliary: No focal liver abnormality is seen. No gallstones, gallbladder wall thickening, or biliary dilatation. Pancreas: Unchanged 1.4 cm low-density lesion in the pancreatic head. No ductal dilatation or surrounding inflammatory changes. Spleen: Normal in size without focal abnormality. Adrenals/Urinary Tract: Adrenal glands are unremarkable. There are three nonobstructive left renal calculi measuring up to 3 mm. Multiple, at least six, punctate calculi in the mid and lower poles of the right kidney. 1.2 cm calculus in the distal right ureter just proximal to the UVJ with resultant moderate to severe hydroureteronephrosis. Mild right perinephric fat stranding. Subcentimeter low-density lesion in the lower pole of the left kidney is too small to characterize. The bladder is decompressed. Stomach/Bowel: Stomach is within normal limits. Appendix appears normal. No evidence of bowel wall thickening, obstruction, or inflammatory changes. The cecum is distended with stool. Vascular/Lymphatic: Aortic atherosclerosis. No enlarged abdominal or pelvic lymph nodes. Reproductive: Uterus and bilateral adnexa are  unremarkable. Pessary device in place. Other: Trace free fluid in the pelvis.  No pneumoperitoneum. Musculoskeletal: No acute or significant osseous findings. IMPRESSION: 1. 1.2 cm calculus in the distal right ureter just proximal to the UVJ with resultant moderate to severe hydroureteronephrosis. 2. Additional bilateral nonobstructive nephrolithiasis. 3. Unchanged 1.4 cm low-density lesion in the pancreatic head, stable since prior MRI from August 2020, favoring benignity. 4. Aortic Atherosclerosis (ICD10-I70.0). Electronically Signed   By: Obie DredgeWilliam T Derry M.D.   On: 04/22/2021 15:06   EKG: Not performed in ED.  Assessment/Plan Principal Problem:   Hydronephrosis with renal calculous obstruction Active Problems:   Hypothyroidism   History of stroke   Gastroesophageal reflux disease   Hair loss   Acute lower UTI  Hydronephrosis with renal calculus obstruction Urinary tract infection > Patient presenting with low back pain with associated nausea vomiting.  Symptoms similar to prior renal colic. > Found to have 1.2 cm obstructing renal stone near the right UVJ. > Urinalysis also came back concerning for early infection with leukocytes, white cells and blood.  No leukocytosis on CBC. > Urology consulted in ED will see the patient tomorrow with plan for urologic procedure. - Continue ceftriaxone for developing UTI - N.p.o. at midnight - Continue as needed pain control with Toradol considering good renal function and softer blood pressures with opioids in the ED - Trend renal function and CBC  History of stroke - Holding home Plavix given urologic procedure tomorrow  Hair loss - Hold spironolactone for now as patient has had lower blood pressures with analgesics in the ED  GERD - Continue home Pepcid  Hypothyroidism - Continue home Synthroid  DVT prophylaxis: SCDs  Code Status:   Full  Family Communication:  None on admission Disposition Plan:   Patient is  from:  Home  Anticipated DC to:  Home  Anticipated DC date:  1 to 2 days  Anticipated DC barriers: None  Consults called:  Urology consulted by EDP, will see the patient tomorrow with plan for procedure.   Admission status:  Observation, telemetry   Severity of Illness: The appropriate patient status for this patient is OBSERVATION. Observation status is judged to be reasonable and necessary in order to provide the  required intensity of service to ensure the patient's safety. The patient's presenting symptoms, physical exam findings, and initial radiographic and laboratory data in the context of their medical condition is felt to place them at decreased risk for further clinical deterioration. Furthermore, it is anticipated that the patient will be medically stable for discharge from the hospital within 2 midnights of admission. The following factors support the patient status of observation.   " The patient's presenting symptoms include back pain, nausea, vomiting. " The physical exam findings include stable physical exam. " The initial radiographic and laboratory data are CT scan showing 1.2 cm renal calculus at the right UVJ and other nonobstructive calculus.  Also noted was right moderate to severe hydroureteronephrosis.   Synetta Fail MD [facility]  How to contact the [facility] Attending or Consulting provider 7A - 7P or covering provider during after hours 7P -7A, for this patient?   1. Check the care team in [facility] and look for a) attending/consulting [facility] provider listed and b) the [facility] team listed 2. Log into [email or web address] and use [facility] universal password to access. If you do not have the password, please contact the hospital operator. 3. Locate the [facility] provider you are looking for under [facility] and page to a number that you can be directly reached. 4. If you still have difficulty reaching the provider, please page the [facility] (Director on Call) for the Hospitalists  listed on amion for assistance.  04/23/2021, 12:18 AM

## 2021-04-23 ENCOUNTER — Observation Stay (HOSPITAL_COMMUNITY): Payer: Medicare Other

## 2021-04-23 ENCOUNTER — Observation Stay (HOSPITAL_COMMUNITY): Payer: Medicare Other | Admitting: Certified Registered Nurse Anesthetist

## 2021-04-23 ENCOUNTER — Encounter (HOSPITAL_COMMUNITY)
Admission: EM | Disposition: A | Discharge status: Home / Self Care | Payer: Self-pay | Source: Home / Self Care | Attending: Emergency Medicine

## 2021-04-23 DIAGNOSIS — E039 Hypothyroidism, unspecified: Secondary | ICD-10-CM | POA: Diagnosis not present

## 2021-04-23 DIAGNOSIS — Z20822 Contact with and (suspected) exposure to covid-19: Secondary | ICD-10-CM | POA: Diagnosis not present

## 2021-04-23 DIAGNOSIS — N390 Urinary tract infection, site not specified: Secondary | ICD-10-CM

## 2021-04-23 DIAGNOSIS — L659 Nonscarring hair loss, unspecified: Secondary | ICD-10-CM

## 2021-04-23 DIAGNOSIS — N189 Chronic kidney disease, unspecified: Secondary | ICD-10-CM | POA: Diagnosis not present

## 2021-04-23 DIAGNOSIS — N132 Hydronephrosis with renal and ureteral calculous obstruction: Secondary | ICD-10-CM | POA: Diagnosis not present

## 2021-04-23 HISTORY — PX: CYSTOSCOPY/URETEROSCOPY/HOLMIUM LASER/STENT PLACEMENT: SHX6546

## 2021-04-23 LAB — COMPREHENSIVE METABOLIC PANEL
ALT: 17 U/L (ref 0–44)
AST: 18 U/L (ref 15–41)
Albumin: 3.2 g/dL — ABNORMAL LOW (ref 3.5–5.0)
Alkaline Phosphatase: 43 U/L (ref 38–126)
Anion gap: 3 — ABNORMAL LOW (ref 5–15)
BUN: 23 mg/dL (ref 8–23)
CO2: 24 mmol/L (ref 22–32)
Calcium: 8.1 mg/dL — ABNORMAL LOW (ref 8.9–10.3)
Chloride: 108 mmol/L (ref 98–111)
Creatinine, Ser: 0.86 mg/dL (ref 0.44–1.00)
GFR, Estimated: >60 mL/min (ref >60)
Glucose, Bld: 105 mg/dL — ABNORMAL HIGH (ref 70–99)
Potassium: 3.9 mmol/L (ref 3.5–5.1)
Sodium: 135 mmol/L (ref 135–145)
Total Bilirubin: 0.6 mg/dL (ref 0.3–1.2)
Total Protein: 5.1 g/dL — ABNORMAL LOW (ref 6.5–8.1)

## 2021-04-23 LAB — PROTIME-INR
INR: 1 (ref 0.8–1.2)
Prothrombin Time: 13.2 seconds (ref 11.4–15.2)

## 2021-04-23 LAB — CBC
HCT: 37 % (ref 36.0–46.0)
Hemoglobin: 12.6 g/dL (ref 12.0–15.0)
MCH: 33.5 pg (ref 26.0–34.0)
MCHC: 34.1 g/dL (ref 30.0–36.0)
MCV: 98.4 fL (ref 80.0–100.0)
Platelets: 184 10*3/uL (ref 150–400)
RBC: 3.76 MIL/uL — ABNORMAL LOW (ref 3.87–5.11)
RDW: 12.1 % (ref 11.5–15.5)
WBC: 5.4 10*3/uL (ref 4.0–10.5)
nRBC: 0 % (ref 0.0–0.2)

## 2021-04-23 SURGERY — CYSTOSCOPY/URETEROSCOPY/HOLMIUM LASER/STENT PLACEMENT
Anesthesia: General | Site: Ureter | Laterality: Right

## 2021-04-23 MED ORDER — SODIUM CHLORIDE 0.9 % IV SOLN
INTRAVENOUS | Status: AC
  Filled 2021-04-23: qty 10

## 2021-04-23 MED ORDER — DEXAMETHASONE SODIUM PHOSPHATE 10 MG/ML IJ SOLN
INTRAMUSCULAR | Status: AC
  Filled 2021-04-23: qty 1

## 2021-04-23 MED ORDER — IOHEXOL 300 MG/ML  SOLN
INTRAMUSCULAR | Status: DC | PRN
  Administered 2021-04-23: 5 mL

## 2021-04-23 MED ORDER — PHENYLEPHRINE 40 MCG/ML (10ML) SYRINGE FOR IV PUSH (FOR BLOOD PRESSURE SUPPORT)
PREFILLED_SYRINGE | INTRAVENOUS | Status: DC | PRN
  Administered 2021-04-23 (×2): 160 ug via INTRAVENOUS

## 2021-04-23 MED ORDER — DEXAMETHASONE SODIUM PHOSPHATE 10 MG/ML IJ SOLN
INTRAMUSCULAR | Status: DC | PRN
  Administered 2021-04-23: 8 mg via INTRAVENOUS

## 2021-04-23 MED ORDER — LIDOCAINE 2% (20 MG/ML) 5 ML SYRINGE
INTRAMUSCULAR | Status: DC | PRN
  Administered 2021-04-23: 50 mg via INTRAVENOUS

## 2021-04-23 MED ORDER — PHENYLEPHRINE 40 MCG/ML (10ML) SYRINGE FOR IV PUSH (FOR BLOOD PRESSURE SUPPORT)
PREFILLED_SYRINGE | INTRAVENOUS | Status: AC
  Filled 2021-04-23: qty 10

## 2021-04-23 MED ORDER — KETOROLAC TROMETHAMINE 15 MG/ML IJ SOLN
INTRAMUSCULAR | Status: DC | PRN
  Administered 2021-04-23: 15 mg via INTRAVENOUS

## 2021-04-23 MED ORDER — SODIUM CHLORIDE 0.9 % IR SOLN
Status: DC | PRN
  Administered 2021-04-23: 6000 mL

## 2021-04-23 MED ORDER — MIDAZOLAM HCL 5 MG/5ML IJ SOLN
INTRAMUSCULAR | Status: DC | PRN
  Administered 2021-04-23: 2 mg via INTRAVENOUS

## 2021-04-23 MED ORDER — LACTATED RINGERS IV SOLN: INTRAVENOUS | Status: DC | PRN

## 2021-04-23 MED ORDER — FENTANYL CITRATE (PF) 100 MCG/2ML IJ SOLN: 25.0000 ug | INTRAMUSCULAR | Status: DC | PRN

## 2021-04-23 MED ORDER — ONDANSETRON HCL 4 MG/2ML IJ SOLN
INTRAMUSCULAR | Status: AC
  Filled 2021-04-23: qty 2

## 2021-04-23 MED ORDER — ONDANSETRON HCL 4 MG/2ML IJ SOLN
INTRAMUSCULAR | Status: DC | PRN
  Administered 2021-04-23: 4 mg via INTRAVENOUS

## 2021-04-23 MED ORDER — PROPOFOL 10 MG/ML IV BOLUS
INTRAVENOUS | Status: DC | PRN
  Administered 2021-04-23: 70 mg via INTRAVENOUS

## 2021-04-23 MED ORDER — OXYBUTYNIN CHLORIDE 5 MG PO TABS: 5.0000 mg | ORAL_TABLET | Freq: Three times a day (TID) | ORAL | 1 refills | Status: DC | PRN

## 2021-04-23 MED ORDER — MIDAZOLAM HCL 2 MG/2ML IJ SOLN
INTRAMUSCULAR | Status: AC
  Filled 2021-04-23: qty 2

## 2021-04-23 MED ORDER — HYDROCODONE-ACETAMINOPHEN 5-325 MG PO TABS: 1.0000 | ORAL_TABLET | ORAL | 0 refills | Status: DC | PRN

## 2021-04-23 MED ORDER — FENTANYL CITRATE (PF) 100 MCG/2ML IJ SOLN
INTRAMUSCULAR | Status: DC | PRN
  Administered 2021-04-23: 50 ug via INTRAVENOUS
  Administered 2021-04-23 (×2): 25 ug via INTRAVENOUS

## 2021-04-23 MED ORDER — LIDOCAINE 2% (20 MG/ML) 5 ML SYRINGE
INTRAMUSCULAR | Status: AC
  Filled 2021-04-23: qty 5

## 2021-04-23 MED ORDER — ACETAMINOPHEN 500 MG PO TABS
1000.0000 mg | ORAL_TABLET | Freq: Once | ORAL | Status: DC
  Filled 2021-04-23: qty 2

## 2021-04-23 MED ORDER — FENTANYL CITRATE (PF) 100 MCG/2ML IJ SOLN
INTRAMUSCULAR | Status: AC
  Filled 2021-04-23: qty 2

## 2021-04-23 MED ORDER — KETOROLAC TROMETHAMINE 30 MG/ML IJ SOLN
INTRAMUSCULAR | Status: AC
  Filled 2021-04-23: qty 1

## 2021-04-23 MED ORDER — CEPHALEXIN 500 MG PO CAPS: 500.0000 mg | ORAL_CAPSULE | Freq: Two times a day (BID) | ORAL | 0 refills | Status: AC

## 2021-04-23 MED ORDER — DEXTROSE 5 % IV SOLN
INTRAVENOUS | Status: DC | PRN
  Administered 2021-04-23: 1 g via INTRAVENOUS

## 2021-04-23 MED ORDER — PHENYLEPHRINE 40 MCG/ML (10ML) SYRINGE FOR IV PUSH (FOR BLOOD PRESSURE SUPPORT)
PREFILLED_SYRINGE | INTRAVENOUS | Status: AC
  Filled 2021-04-23: qty 20

## 2021-04-23 MED ORDER — EPHEDRINE SULFATE-NACL 50-0.9 MG/10ML-% IV SOSY
PREFILLED_SYRINGE | INTRAVENOUS | Status: DC | PRN
  Administered 2021-04-23 (×3): 5 mg via INTRAVENOUS

## 2021-04-23 MED ORDER — PROPOFOL 10 MG/ML IV BOLUS
INTRAVENOUS | Status: AC
  Filled 2021-04-23: qty 20

## 2021-04-23 MED ORDER — PHENAZOPYRIDINE HCL 200 MG PO TABS: 200.0000 mg | ORAL_TABLET | Freq: Three times a day (TID) | ORAL | 0 refills | Status: DC | PRN

## 2021-04-23 SURGICAL SUPPLY — 22 items
BAG URO CATCHER STRL LF (MISCELLANEOUS) ×2 IMPLANT
BASKET ZERO TIP NITINOL 2.4FR (BASKET) ×1 IMPLANT
BSKT STON RTRVL ZERO TP 2.4FR (BASKET) ×1
CATH URET 5FR 28IN OPEN ENDED (CATHETERS) ×2 IMPLANT
CLOTH BEACON ORANGE TIMEOUT ST (SAFETY) ×2 IMPLANT
EXTRACTOR STONE NITINOL NGAGE (UROLOGICAL SUPPLIES) IMPLANT
GLOVE SURG ENC TEXT LTX SZ7.5 (GLOVE) ×2 IMPLANT
GOWN STRL REUS W/TWL XL LVL3 (GOWN DISPOSABLE) ×2 IMPLANT
GUIDEWIRE ANG ZIPWIRE 038X150 (WIRE) ×2 IMPLANT
GUIDEWIRE STR DUAL SENSOR (WIRE) IMPLANT
GUIDEWIRE ZIPWRE .038 STRAIGHT (WIRE) ×2 IMPLANT
KIT TURNOVER KIT A (KITS) ×2 IMPLANT
LASER FIB FLEXIVA PULSE ID 365 (Laser) IMPLANT
MANIFOLD NEPTUNE II (INSTRUMENTS) ×2 IMPLANT
PACK CYSTO (CUSTOM PROCEDURE TRAY) ×2 IMPLANT
SHEATH URETERAL 12FRX35CM (MISCELLANEOUS) IMPLANT
STENT URET 6FRX24 CONTOUR (STENTS) ×2 IMPLANT
STENT URET 6FRX26 CONTOUR (STENTS) IMPLANT
TRACTIP FLEXIVA PULS ID 200XHI (Laser) IMPLANT
TRACTIP FLEXIVA PULSE ID 200 (Laser) ×1 IMPLANT
TUBING CONNECTING 10 (TUBING) ×2 IMPLANT
TUBING UROLOGY SET (TUBING) ×2 IMPLANT

## 2021-04-23 NOTE — Discharge Summary (Signed)
Physician Discharge Summary  Linda Macdonald ZOX:[id number] DOB: 02/17/1941 DOA: 04/22/2021  PCP: Dois Davenportichter, Linda L, MD  Admit date: 04/22/2021   Discharge date: 04/23/2021  Admitted From: Home.  Disposition:  Home.  Recommendations for Outpatient Follow-up:  1. Follow up with PCP in 1-2 weeks. 2. Please obtain BMP/CBC in one week. 3. Advised to take Keflex twice daily for three days. 4. Advised Pyridium as needed. 5. Follow up Urology in one week:  Home Health:None.  Equipment/Devices:None  Discharge Condition: Stable CODE STATUS:Full code Diet recommendation: Heart Healthy   [facility]rief [facility]: This 80 y.o. female with medical history significant of anemia, essential tremor, prior renal calculi, history of DVT, hairloss, depression, GERD, hypothyroidism, CVA, RLS, OSA, CKD who presented in the ED with low back pain for last one day. She states that she had a flight back home from [place] this morning and she began to have nausea and vomiting during the flight.  She also started to have some pain later on.   Urinalysis showed leukocytes, white cells and blood concerning for early UTI. CT Renal study showed  1.2 cm calculus in the distal right ureter just proximal to the UVJ with resultant moderate to severe hydroureteronephrosis. Additional bilateral nonobstructive nephrolithiasis.  Patient started on ceftriaxone and given IV fluids as well as pain control in the ED.  Patient was admitted for UTI secondary to right renal calculi causing bilateral hydronephrosis.  Urology was consulted, patient underwent cystoscopy with right ureteroscopy, holmium laser lithotripsy and right JJ stent placement.  Patient tolerated procedure well,  she reports doing much better, reports pain is improved.  Patient is cleared from urology to be discharged.  Patient is being discharged home on Keflex and pain medications. Patient will follow up with urology in 1 week for removal of his  stent.  Discharge Diagnoses:  Principal Problem:   Hydronephrosis with renal calculous obstruction Active Problems:   Hypothyroidism   History of stroke   Gastroesophageal reflux disease   Hair loss   Acute lower UTI   Discharge Instructions  Discharge Instructions    Call MD for:  difficulty breathing, headache or visual disturbances   Complete by: As directed    Call MD for:  persistant dizziness or light-headedness   Complete by: As directed    Call MD for:  persistant nausea and vomiting   Complete by: As directed    Call MD for:  severe uncontrolled pain   Complete by: As directed    Diet - low sodium heart healthy   Complete by: As directed    Diet Carb Modified   Complete by: As directed    Discharge instructions   Complete by: As directed    Advised to follow up PCP in one week. Advised to take Keflex twice daily for three days. Advised Pyridium as needed. Follow up Urology in one week   Increase activity slowly   Complete by: As directed      Allergies as of 04/23/2021      Reactions   Erythromycin Swelling   Penicillins Swelling, Rash      Medication List    TAKE these medications   cephALEXin 500 MG capsule Commonly known as: KEFLEX Take 1 capsule (500 mg total) by mouth 2 (two) times daily for 3 days. Start taking the day before your follow-up appointment   clopidogrel 75 MG tablet Commonly known as: PLAVIX Take 1 tablet by mouth daily.   estradiol 0.1 MG/GM vaginal cream Commonly  known as: ESTRACE Place 1 Applicatorful vaginally 2 (two) times a week.   famotidine 40 MG tablet Commonly known as: PEPCID Take 40 mg by mouth 2 (two) times daily.   HAIR/SKIN/NAILS PO Take 1 packet by mouth at bedtime.   HYDROcodone-acetaminophen 5-325 MG tablet Commonly known as: Norco Take 1 tablet by mouth every 4 (four) hours as needed for moderate pain.   levothyroxine 25 MCG tablet Commonly known as: Levothroid Take 1 tablet (25 mcg total) by mouth  daily.   MULTIVITAMIN PO Take 1 tablet by mouth daily.   oxybutynin 5 MG tablet Commonly known as: DITROPAN Take 1 tablet (5 mg total) by mouth every 8 (eight) hours as needed for bladder spasms.   phenazopyridine 200 MG tablet Commonly known as: Pyridium Take 1 tablet (200 mg total) by mouth 3 (three) times daily as needed (for pain with urination).   spironolactone 50 MG tablet Commonly known as: ALDACTONE Take 50 mg by mouth daily.       Follow-up Information    [facility] In 1 week.   Why: Stent removal Contact information: [address] [place] [address] [phone]       Dois Davenport, MD Follow up in 1 week(s).   Specialty: Family Medicine Contact information: [address] [place] [address] [phone]              Allergies  Allergen Reactions  . Erythromycin Swelling  . Penicillins Swelling and Rash    Consultations:  Urology   Procedures/Studies: DG C-Arm 1-60 Min-No Report  Result Date: 04/23/2021 Fluoroscopy was utilized by the requesting physician.  No radiographic interpretation.   CT Renal Stone Study  Result Date: 04/22/2021 CLINICAL DATA:  Lower abdominal and back pain with vomiting. EXAM: CT ABDOMEN AND PELVIS WITHOUT CONTRAST TECHNIQUE: Multidetector CT imaging of the abdomen and pelvis was performed following the standard protocol without IV contrast. COMPARISON:  MRI abdomen dated August 05, 2021. CT abdomen pelvis dated Apr 09, 2017. FINDINGS: Lower chest: No acute abnormality. Bibasilar linear atelectasis/scarring. Hepatobiliary: No focal liver abnormality is seen. No gallstones, gallbladder wall thickening, or biliary dilatation. Pancreas: Unchanged 1.4 cm low-density lesion in the pancreatic head. No ductal dilatation or surrounding inflammatory changes. Spleen: Normal in size without focal abnormality. Adrenals/Urinary Tract: Adrenal glands are unremarkable. There are three  nonobstructive left renal calculi measuring up to 3 mm. Multiple, at least six, punctate calculi in the mid and lower poles of the right kidney. 1.2 cm calculus in the distal right ureter just proximal to the UVJ with resultant moderate to severe hydroureteronephrosis. Mild right perinephric fat stranding. Subcentimeter low-density lesion in the lower pole of the left kidney is too small to characterize. The bladder is decompressed. Stomach/Bowel: Stomach is within normal limits. Appendix appears normal. No evidence of bowel wall thickening, obstruction, or inflammatory changes. The cecum is distended with stool. Vascular/Lymphatic: Aortic atherosclerosis. No enlarged abdominal or pelvic lymph nodes. Reproductive: Uterus and bilateral adnexa are unremarkable. Pessary device in place. Other: Trace free fluid in the pelvis.  No pneumoperitoneum. Musculoskeletal: No acute or significant osseous findings. IMPRESSION: 1. 1.2 cm calculus in the distal right ureter just proximal to the UVJ with resultant moderate to severe hydroureteronephrosis. 2. Additional bilateral nonobstructive nephrolithiasis. 3. Unchanged 1.4 cm low-density lesion in the pancreatic head, stable since prior MRI from August 2020, favoring benignity. 4. Aortic Atherosclerosis (ICD10-I70.0). Electronically Signed   By: Obie Dredge M.D.   On: 04/22/2021  15:06   Cystoscopy, lithotripsy, stone removal.   Subjective: Patient was seen and examined after the procedure.  She reports doing much better after the procedure,  reports pain is improved.  urine is getting clear she feels better and wanst to be discharged.  Discharge Exam: Vitals:   04/23/21 0927 04/23/21 0946  BP: (!) 106/49 (!) 111/51  Pulse: 77 77  Resp: 14 18  Temp: 97.8 F (36.6 C) 97.7 F (36.5 C)  SpO2: 100% 98%   Vitals:   04/23/21 0914 04/23/21 0915 04/23/21 0927 04/23/21 0946  BP:  (!) 111/46 (!) 106/49 (!) 111/51  Pulse: 75 82 77 77  Resp: 17 17 14 18   Temp:    97.8 F (36.6 C) 97.7 F (36.5 C)  TempSrc:    Oral  SpO2: 100% 100% 100% 98%  Weight:      Height:        General: Pt is alert, awake, not in acute distress Cardiovascular: RRR, S1/S2 +, no rubs, no gallops Respiratory: CTA bilaterally, no wheezing, no rhonchi Abdominal: Soft, NT, ND, bowel sounds + Extremities: no edema, no cyanosis    The results of significant diagnostics from this hospitalization (including imaging, microbiology, ancillary and laboratory) are listed below for reference.     Microbiology: Recent Results (from the past 240 hour(s))  Resp Panel by RT-PCR (Flu A&B, Covid) Nasopharyngeal Swab     Status: None   Collection Time: 04/22/21  4:35 PM   Specimen: Nasopharyngeal Swab; Nasopharyngeal(NP) swabs in vial transport medium  Result Value Ref Range Status   SARS Coronavirus 2 by RT PCR NEGATIVE NEGATIVE Final    Comment: (NOTE) SARS-CoV-2 target nucleic acids are NOT DETECTED.  The SARS-CoV-2 RNA is generally detectable in upper respiratory specimens during the acute phase of infection. The lowest concentration of SARS-CoV-2 viral copies this assay can detect is 138 copies/mL. A negative result does not preclude SARS-Cov-2 infection and should not be used as the sole basis for treatment or other patient management decisions. A negative result may occur with  improper specimen collection/handling, submission of specimen other than nasopharyngeal swab, presence of viral mutation(s) within the areas targeted by this assay, and inadequate number of viral copies(<138 copies/mL). A negative result must be combined with clinical observations, patient history, and epidemiological information. The expected result is Negative.  Fact Sheet for Patients:  04/24/21  Fact Sheet for Healthcare Providers:  BloggerCourse.com  This test is no t yet approved or cleared by the [place] FDA and  has been  authorized for detection and/or diagnosis of SARS-CoV-2 by FDA under an Emergency Use Authorization (EUA). This EUA will remain  in effect (meaning this test can be used) for the duration of the COVID-19 declaration under Section 564(b)(1) of the Act, 21 U.S.C.section 360bbb-3(b)(1), unless the authorization is terminated  or revoked sooner.       Influenza A by PCR NEGATIVE NEGATIVE Final   Influenza B by PCR NEGATIVE NEGATIVE Final    Comment: (NOTE) The Xpert Xpress SARS-CoV-2/FLU/RSV plus assay is intended as an aid in the diagnosis of influenza from Nasopharyngeal swab specimens and should not be used as a sole basis for treatment. Nasal washings and aspirates are unacceptable for Xpert Xpress SARS-CoV-2/FLU/RSV testing.  Fact Sheet for Patients: [place]  Fact Sheet for Healthcare Providers: BloggerCourse.com  This test is not yet approved or cleared by the [place] FDA and has been authorized for detection and/or diagnosis of SARS-CoV-2 by FDA under an Emergency Use  Authorization (EUA). This EUA will remain in effect (meaning this test can be used) for the duration of the COVID-19 declaration under Section 564(b)(1) of the Act, 21 U.S.C. section 360bbb-3(b)(1), unless the authorization is terminated or revoked.  Performed at [facility], [address], [place], [place] [address]      Labs: BNP (last 3 results) No results for input(s): BNP in the last 8760 hours. Basic Metabolic Panel: Recent Labs  Lab 04/22/21 1355 04/23/21 0223  NA 136 135  K 4.1 3.9  CL 102 108  CO2 27 24  GLUCOSE 149* 105*  BUN 27* 23  CREATININE 0.98 0.86  CALCIUM 9.7 8.1*   Liver Function Tests: Recent Labs  Lab 04/22/21 1355 04/23/21 0223  AST 27 18  ALT 22 17  ALKPHOS 56 43  BILITOT 0.6 0.6  PROT 7.2 5.1*  ALBUMIN 4.6 3.2*   Recent Labs  Lab 04/22/21 1355  LIPASE 48   No results for  input(s): AMMONIA in the last 168 hours. CBC: Recent Labs  Lab 04/22/21 1355 04/23/21 0223  WBC 8.7 5.4  NEUTROABS 8.0*  --   HGB 14.9 12.6  HCT 42.8 37.0  MCV 96.6 98.4  PLT 205 184   Cardiac Enzymes: No results for input(s): CKTOTAL, CKMB, CKMBINDEX, TROPONINI in the last 168 hours. BNP: Invalid input(s): POCBNP CBG: No results for input(s): GLUCAP in the last 168 hours. D-Dimer No results for input(s): DDIMER in the last 72 hours. Hgb A1c No results for input(s): HGBA1C in the last 72 hours. Lipid Profile No results for input(s): CHOL, HDL, LDLCALC, TRIG, CHOLHDL, LDLDIRECT in the last 72 hours. Thyroid function studies No results for input(s): TSH, T4TOTAL, T3FREE, THYROIDAB in the last 72 hours.  Invalid input(s): FREET3 Anemia work up No results for input(s): VITAMINB12, FOLATE, FERRITIN, TIBC, IRON, RETICCTPCT in the last 72 hours. Urinalysis    Component Value Date/Time   COLORURINE YELLOW 04/22/2021 2207   APPEARANCEUR HAZY (A) 04/22/2021 2207   LABSPEC 1.015 04/22/2021 2207   PHURINE 6.0 04/22/2021 2207   GLUCOSEU NEGATIVE 04/22/2021 2207   GLUCOSEU NEGATIVE 05/04/2010 1058   HGBUR NEGATIVE 04/22/2021 2207   BILIRUBINUR NEGATIVE 04/22/2021 2207   KETONESUR 5 (A) 04/22/2021 2207   PROTEINUR NEGATIVE 04/22/2021 2207   UROBILINOGEN 0.2 05/04/2010 1058   NITRITE NEGATIVE 04/22/2021 2207   LEUKOCYTESUR LARGE (A) 04/22/2021 2207   Sepsis Labs Invalid input(s): PROCALCITONIN,  WBC,  LACTICIDVEN Microbiology Recent Results (from the past 240 hour(s))  Resp Panel by RT-PCR (Flu A&B, Covid) Nasopharyngeal Swab     Status: None   Collection Time: 04/22/21  4:35 PM   Specimen: Nasopharyngeal Swab; Nasopharyngeal(NP) swabs in vial transport medium  Result Value Ref Range Status   SARS Coronavirus 2 by RT PCR NEGATIVE NEGATIVE Final    Comment: (NOTE) SARS-CoV-2 target nucleic acids are NOT DETECTED.  The SARS-CoV-2 RNA is generally detectable in upper  respiratory specimens during the acute phase of infection. The lowest concentration of SARS-CoV-2 viral copies this assay can detect is 138 copies/mL. A negative result does not preclude SARS-Cov-2 infection and should not be used as the sole basis for treatment or other patient management decisions. A negative result may occur with  improper specimen collection/handling, submission of specimen other than nasopharyngeal swab, presence of viral mutation(s) within the areas targeted by this assay, and inadequate number of viral copies(<138 copies/mL). A negative result must be combined with clinical observations, patient history, and epidemiological information. The expected result  is Negative.  Fact Sheet for Patients:  BloggerCourse.com  Fact Sheet for Healthcare Providers:  [employer]  This test is no t yet approved or cleared by the [place] FDA and  has been authorized for detection and/or diagnosis of SARS-CoV-2 by FDA under an Emergency Use Authorization (EUA). This EUA will remain  in effect (meaning this test can be used) for the duration of the COVID-19 declaration under Section 564(b)(1) of the Act, 21 U.S.C.section 360bbb-3(b)(1), unless the authorization is terminated  or revoked sooner.       Influenza A by PCR NEGATIVE NEGATIVE Final   Influenza B by PCR NEGATIVE NEGATIVE Final    Comment: (NOTE) The Xpert Xpress SARS-CoV-2/FLU/RSV plus assay is intended as an aid in the diagnosis of influenza from Nasopharyngeal swab specimens and should not be used as a sole basis for treatment. Nasal washings and aspirates are unacceptable for Xpert Xpress SARS-CoV-2/FLU/RSV testing.  Fact Sheet for Patients: BloggerCourse.com  Fact Sheet for Healthcare Providers: [employer]  This test is not yet approved or cleared by the [place] FDA and has been  authorized for detection and/or diagnosis of SARS-CoV-2 by FDA under an Emergency Use Authorization (EUA). This EUA will remain in effect (meaning this test can be used) for the duration of the COVID-19 declaration under Section 564(b)(1) of the Act, 21 U.S.C. section 360bbb-3(b)(1), unless the authorization is terminated or revoked.  Performed at [facility], [address], [place], [place] [address]      Time coordinating discharge: Over 30 minutes  SIGNED:   Cipriano Bunker, MD  [facility] 04/23/2021, 3:18 PM Pager   If 7PM-7AM, please contact night-coverage [email or web address]

## 2021-04-23 NOTE — Progress Notes (Signed)
(  Sent page to [employer].) @ [phone]  [address] Pt would like to talk to the surgeon before her procedure. thanks Abby, Charity fundraiser

## 2021-04-23 NOTE — Anesthesia Preprocedure Evaluation (Addendum)
Anesthesia Evaluation  Patient identified by MRN, date of birth, ID band Patient awake    Reviewed: Allergy & Precautions, NPO status , Patient's Chart, lab work & pertinent test results  Airway Mallampati: I  TM Distance: >3 FB Neck ROM: Full    Dental no notable dental hx. (+) Teeth Intact, Dental Advisory Given   Pulmonary sleep apnea (does not use CPAP) ,    Pulmonary exam normal breath sounds clear to auscultation       Cardiovascular + DVT  Normal cardiovascular exam Rhythm:Regular Rate:Normal     Neuro/Psych  Headaches, PSYCHIATRIC DISORDERS Depression Essential tremor CVA (balance problems), Residual Symptoms    GI/Hepatic Neg liver ROS, GERD  Medicated and Controlled,  Endo/Other  Hypothyroidism   Renal/GU Renal InsufficiencyRenal disease (K 3.9)  negative genitourinary   Musculoskeletal negative musculoskeletal ROS (+)   Abdominal   Peds  Hematology  (+) Blood dyscrasia (on plavix), ,   Anesthesia Other Findings Right ureteral stone  Reproductive/Obstetrics                           Anesthesia Physical Anesthesia Plan  ASA: III  Anesthesia Plan: General   Post-op Pain Management:    Induction: Intravenous  PONV Risk Score and Plan: 3 and Ondansetron, Dexamethasone and Treatment may vary due to age or medical condition  Airway Management Planned: LMA  Additional Equipment:   Intra-op Plan:   Post-operative Plan: Extubation in OR  Informed Consent: I have reviewed the patients History and Physical, chart, labs and discussed the procedure including the risks, benefits and alternatives for the proposed anesthesia with the patient or authorized representative who has indicated his/her understanding and acceptance.     Dental advisory given  Plan Discussed with: CRNA  Anesthesia Plan Comments:         Anesthesia Quick Evaluation

## 2021-04-23 NOTE — Op Note (Signed)
Operative Note  Preoperative diagnosis:  1.  1.2 cm right UVJ calculus  Postoperative diagnosis: 1.  1.2 cm right UPJ calculus  Procedure(s): 1.  Cystoscopy with right ureteroscopy, holmium laser lithotripsy and right JJ stent placement 2.  Right retrograde pyelogram with intraoperative interpretation fluoroscopic imaging  Surgeon: Rhoderick Moody, MD  Assistants:  None  Anesthesia:  General  Complications:  None  EBL: Less than 5 mL  Specimens: 1.  Right ureteral stone fragments  Drains/Catheters: 1.  Right 6 French, 24 cm JJ stent without tether  Intraoperative findings:   1. Obstructing and impacted 1.2 cm right UVJ calculus 2. Right retrograde pyelogram revealed a large filling defect within the distal aspects of the right ureter that partially occluded contrast from opacifying the more proximal aspects of the ureter.  Indication:  Linda Macdonald is a 80 y.o. female with a 24-hour history of right renal colic secondary to a 1.2 cm right UVJ calculus.  She has been consented for the above procedures, voices understanding and wishes to proceed.  Description of procedure:  After informed consent was obtained, the patient was brought to the operating room and general LMA anesthesia was administered. The patient was then placed in the dorsolithotomy position and prepped and draped in the usual sterile fashion. A timeout was performed. A 23 French rigid cystoscope was then inserted into the urethral meatus and advanced into the bladder under direct vision. A complete bladder survey revealed no intravesical pathology.  A 5 French ureteral catheter was then inserted into the right ureteral orifice and a retrograde pyelogram was obtained, with the findings listed above.  A Glidewire was then used to intubate the lumen of the ureteral catheter and was advanced up to the right renal pelvis, under fluoroscopic guidance.  The catheter was then removed, leaving the wire in  place.  A semirigid ureteroscope was then advanced alongside the wire and into the distal aspects of the right ureter where her large stone was identified.  A 200 m holmium laser was then used to fracture the stone into numerous smaller pieces.  A 0 tip basket was then used to extract all stone fragments from the lumen of the right ureter.  A 6 French, 24 cm JJ stent was then placed over the wire and into good position within the right collecting system, confirming placement via fluoroscopy.  The patient's bladder was drained and all stone fragments were removed.  She tolerated the procedure well and was transferred to the postanesthesia in stable condition.  Plan: Okay for discharge if the patient remains afebrile and recovery.  She will need to follow-up in 1 week for stent removal in the office  CC: Dr. Retta Diones

## 2021-04-23 NOTE — Discharge Instructions (Signed)
Advised to follow up PCP in one week. Advised to take Keflex twice daily for three days. Advised Pyridium as needed. Follow up Urology in one week

## 2021-04-23 NOTE — Plan of Care (Signed)
  Problem: Clinical Measurements: Goal: Ability to maintain clinical measurements within normal limits will improve Outcome: Progressing   Problem: Coping: Goal: Level of anxiety will decrease Outcome: Progressing   Problem: Clinical Measurements: Goal: Respiratory complications will improve Outcome: Completed/Met

## 2021-04-23 NOTE — Transfer of Care (Signed)
Immediate Anesthesia Transfer of Care Note  Patient: Linda Macdonald  Procedure(s) Performed: CYSTOSCOPY/URETEROSCOPY/HOLMIUM LASER/STENT PLACEMENT (Right Ureter)  Patient Location: PACU  Anesthesia Type:General  Level of Consciousness: awake, alert  and oriented  Airway & Oxygen Therapy: Patient Spontanous Breathing and Patient connected to face mask  Post-op Assessment: Report given to RN and Post -op Vital signs reviewed and stable  Post vital signs: Reviewed and stable  Last Vitals:  Vitals Value Taken Time  BP 125/51 04/23/21 0855  Temp    Pulse 73 04/23/21 0857  Resp 13 04/23/21 0857  SpO2 100 % 04/23/21 0857  Vitals shown include unvalidated device data.  Last Pain:  Vitals:   04/23/21 0705  TempSrc:   PainSc: 0-No pain         Complications: No complications documented.

## 2021-04-23 NOTE — ED Notes (Signed)
ED TO INPATIENT HANDOFF REPORT  ED Nurse Name Zaylyn Bergdoll  S Name/Age/Gender Linda Macdonald 80 y.o. female Room/Bed: WA01/WA01  Code Status   Code Status: Full Code  Home/SNF/Other Home Patient oriented to: self, place, time and situation Is this baseline? Yes   Triage Complete: Triage complete  Chief Complaint Hydronephrosis with renal calculous obstruction [N13.2]  Triage Note Patient c/o bilateral lower back pain and emesis since 0900 today. Patient unsure about hematuria, but denies frequency. patient also c/o lower abdominal pain.    Allergies Allergies  Allergen Reactions  . Erythromycin Swelling  . Penicillins Swelling and Rash    Level of Care/Admitting Diagnosis ED Disposition    ED Disposition Condition Comment   Admit  [facility] [[id number]]  Level of Care: Telemetry [5]  Admit to tele based on following criteria: Other see comments  Comments: UTI with low blood pressure, closer monitoring  Covid Evaluation: Asymptomatic Screening Protocol (No Symptoms)  Diagnosis: Hydronephrosis with renal calculous obstruction [[id number]][[id number]]  Admitting Physician: Synetta FailMELVIN, ALEXANDER B [[id number]]  Attending Physician: Synetta FailMELVIN, ALEXANDER B [[id number]]       B Medical/Surgery History Past Medical History:  Diagnosis Date  . Arthritis   . Chronic kidney disease   . Depression   . Hair loss    d/t propranolol  . Headache   . Leukopenia 08/12/2012  . Thyroid disease   . Tremor    Past Surgical History:  Procedure Laterality Date  . EXTRACORPOREAL SHOCK WAVE LITHOTRIPSY Left 04/25/2017   Procedure: LEFT EXTRACORPOREAL SHOCK WAVE LITHOTRIPSY (ESWL);  Surgeon: Heloise PurpuraBorden, Lester, MD;  Location: [facility];  Service: Urology;  Laterality: Left;  . KNEE ARTHROSCOPY Left 2oyrs ago   L knee x2/ R knee x1  . LITHOTRIPSY  2012   difficulty voiding after procedure, d/c home with foley per pt  . TUBAL LIGATION    . WISDOM TOOTH EXTRACTION     teenager      A IV Location/Drains/Wounds Patient Lines/Drains/Airways Status    Active Line/Drains/Airways    Name Placement date Placement time Site Days   Peripheral IV 04/22/21 Anterior;Right;Upper Arm 04/22/21  1651  Arm  1          Intake/Output Last 24 hours No intake or output data in the 24 hours ending 04/23/21 0039  Labs/Imaging Results for orders placed or performed during the hospital encounter of 04/22/21 (from the past 48 hour(s))  CBC with Differential     Status: Abnormal   Collection Time: 04/22/21  1:55 PM  Result Value Ref Range   WBC 8.7 4.0 - 10.5 K/uL   RBC 4.43 3.87 - 5.11 MIL/uL   Hemoglobin 14.9 12.0 - 15.0 g/dL   HCT 95.642.8 21.336.0 - 08.646.0 %   MCV 96.6 80.0 - 100.0 fL   MCH 33.6 26.0 - 34.0 pg   MCHC 34.8 30.0 - 36.0 g/dL   RDW 57.812.0 46.911.5 - 62.915.5 %   Platelets 205 150 - 400 K/uL   nRBC 0.0 0.0 - 0.2 %   Neutrophils Relative % 92 %   Neutro Abs 8.0 (H) 1.7 - 7.7 K/uL   Lymphocytes Relative 5 %   Lymphs Abs 0.4 (L) 0.7 - 4.0 K/uL   Monocytes Relative 2 %   Monocytes Absolute 0.2 0.1 - 1.0 K/uL   Eosinophils Relative 0 %   Eosinophils Absolute 0.0 0.0 - 0.5 K/uL   Basophils Relative 0 %   Basophils Absolute 0.0 0.0 - 0.1 K/uL   Immature  Granulocytes 1 %   Abs Immature Granulocytes 0.05 0.00 - 0.07 K/uL    Comment: Performed at [facility], [address] [place], [place] [address]  Comprehensive metabolic panel     Status: Abnormal   Collection Time: 04/22/21  1:55 PM  Result Value Ref Range   Sodium 136 135 - 145 mmol/L   Potassium 4.1 3.5 - 5.1 mmol/L   Chloride 102 98 - 111 mmol/L   CO2 27 22 - 32 mmol/L   Glucose, Bld 149 (H) 70 - 99 mg/dL    Comment: Glucose reference range applies only to samples taken after fasting for at least 8 hours.   BUN 27 (H) 8 - 23 mg/dL   Creatinine, Ser 6.29 0.44 - 1.00 mg/dL   Calcium 9.7 8.9 - 47.6 mg/dL   Total Protein 7.2 6.5 - 8.1 g/dL   Albumin 4.6 3.5 - 5.0 g/dL   AST 27 15 - 41 U/L   ALT 22 0  - 44 U/L   Alkaline Phosphatase 56 38 - 126 U/L   Total Bilirubin 0.6 0.3 - 1.2 mg/dL   GFR, Estimated 59 (L) >60 mL/min    Comment: (NOTE) Calculated using the CKD-EPI Creatinine Equation (2021)    Anion gap 7 5 - 15    Comment: Performed at [facility], [address], [place], [place] [address]  Lipase, blood     Status: None   Collection Time: 04/22/21  1:55 PM  Result Value Ref Range   Lipase 48 11 - 51 U/L    Comment: Performed at [facility], [address], [place], [place] [address]  Resp Panel by RT-PCR (Flu A&B, Covid) Nasopharyngeal Swab     Status: None   Collection Time: 04/22/21  4:35 PM   Specimen: Nasopharyngeal Swab; Nasopharyngeal(NP) swabs in vial transport medium  Result Value Ref Range   SARS Coronavirus 2 by RT PCR NEGATIVE NEGATIVE    Comment: (NOTE) SARS-CoV-2 target nucleic acids are NOT DETECTED.  The SARS-CoV-2 RNA is generally detectable in upper respiratory specimens during the acute phase of infection. The lowest concentration of SARS-CoV-2 viral copies this assay can detect is 138 copies/mL. A negative result does not preclude SARS-Cov-2 infection and should not be used as the sole basis for treatment or other patient management decisions. A negative result may occur with  improper specimen collection/handling, submission of specimen other than nasopharyngeal swab, presence of viral mutation(s) within the areas targeted by this assay, and inadequate number of viral copies(<138 copies/mL). A negative result must be combined with clinical observations, patient history, and epidemiological information. The expected result is Negative.  Fact Sheet for Patients:  BloggerCourse.com  Fact Sheet for Healthcare Providers:  SeriousBroker.it  This test is no t yet approved or cleared by the [place] FDA and  has been authorized for detection and/or diagnosis of  SARS-CoV-2 by FDA under an Emergency Use Authorization (EUA). This EUA will remain  in effect (meaning this test can be used) for the duration of the COVID-19 declaration under Section 564(b)(1) of the Act, 21 U.S.C.section 360bbb-3(b)(1), unless the authorization is terminated  or revoked sooner.       Influenza A by PCR NEGATIVE NEGATIVE   Influenza B by PCR NEGATIVE NEGATIVE    Comment: (NOTE) The Xpert Xpress SARS-CoV-2/FLU/RSV plus assay is intended as an aid in the diagnosis of influenza from Nasopharyngeal swab specimens and should not be used as a sole basis for treatment.  Nasal washings and aspirates are unacceptable for Xpert Xpress SARS-CoV-2/FLU/RSV testing.  Fact Sheet for Patients: BloggerCourse.com  Fact Sheet for Healthcare Providers: SeriousBroker.it  This test is not yet approved or cleared by the [place] FDA and has been authorized for detection and/or diagnosis of SARS-CoV-2 by FDA under an Emergency Use Authorization (EUA). This EUA will remain in effect (meaning this test can be used) for the duration of the COVID-19 declaration under Section 564(b)(1) of the Act, 21 U.S.C. section 360bbb-3(b)(1), unless the authorization is terminated or revoked.  Performed at [facility], [address], [place], [place] [address]   Urinalysis, Routine w reflex microscopic     Status: Abnormal   Collection Time: 04/22/21 10:07 PM  Result Value Ref Range   Color, Urine YELLOW YELLOW   APPearance HAZY (A) CLEAR   Specific Gravity, Urine 1.015 1.005 - 1.030   pH 6.0 5.0 - 8.0   Glucose, UA NEGATIVE NEGATIVE mg/dL   Hgb urine dipstick NEGATIVE NEGATIVE   Bilirubin Urine NEGATIVE NEGATIVE   Ketones, ur 5 (A) NEGATIVE mg/dL   Protein, ur NEGATIVE NEGATIVE mg/dL   Nitrite NEGATIVE NEGATIVE   Leukocytes,Ua LARGE (A) NEGATIVE   RBC / HPF 11-20 0 - 5 RBC/hpf   WBC, UA >50 (H) 0 - 5 WBC/hpf    Bacteria, UA RARE (A) NONE SEEN   Squamous Epithelial / LPF 0-5 0 - 5   Mucus PRESENT     Comment: Performed at [facility], [address], [place], [place] [address]   CT Renal Stone Study  Result Date: 04/22/2021 CLINICAL DATA:  Lower abdominal and back pain with vomiting. EXAM: CT ABDOMEN AND PELVIS WITHOUT CONTRAST TECHNIQUE: Multidetector CT imaging of the abdomen and pelvis was performed following the standard protocol without IV contrast. COMPARISON:  MRI abdomen dated August 05, 2021. CT abdomen pelvis dated Apr 09, 2017. FINDINGS: Lower chest: No acute abnormality. Bibasilar linear atelectasis/scarring. Hepatobiliary: No focal liver abnormality is seen. No gallstones, gallbladder wall thickening, or biliary dilatation. Pancreas: Unchanged 1.4 cm low-density lesion in the pancreatic head. No ductal dilatation or surrounding inflammatory changes. Spleen: Normal in size without focal abnormality. Adrenals/Urinary Tract: Adrenal glands are unremarkable. There are three nonobstructive left renal calculi measuring up to 3 mm. Multiple, at least six, punctate calculi in the mid and lower poles of the right kidney. 1.2 cm calculus in the distal right ureter just proximal to the UVJ with resultant moderate to severe hydroureteronephrosis. Mild right perinephric fat stranding. Subcentimeter low-density lesion in the lower pole of the left kidney is too small to characterize. The bladder is decompressed. Stomach/Bowel: Stomach is within normal limits. Appendix appears normal. No evidence of bowel wall thickening, obstruction, or inflammatory changes. The cecum is distended with stool. Vascular/Lymphatic: Aortic atherosclerosis. No enlarged abdominal or pelvic lymph nodes. Reproductive: Uterus and bilateral adnexa are unremarkable. Pessary device in place. Other: Trace free fluid in the pelvis.  No pneumoperitoneum. Musculoskeletal: No acute or significant osseous findings. IMPRESSION: 1. 1.2  cm calculus in the distal right ureter just proximal to the UVJ with resultant moderate to severe hydroureteronephrosis. 2. Additional bilateral nonobstructive nephrolithiasis. 3. Unchanged 1.4 cm low-density lesion in the pancreatic head, stable since prior MRI from August 2020, favoring benignity. 4. Aortic Atherosclerosis (ICD10-I70.0). Electronically Signed   By: Obie Dredge M.D.   On: 04/22/2021 15:06    Pending Labs Unresulted Labs (From admission, onward)          Start  Ordered   04/23/21 0500  Comprehensive metabolic panel  Tomorrow morning,   R        04/22/21 2334   04/23/21 0500  CBC  Tomorrow morning,   R        04/22/21 2334   04/23/21 0500  Protime-INR  Tomorrow morning,   R        04/22/21 2334   04/22/21 2334  Urine culture  Add-on,   AD        04/22/21 2334          Vitals/Pain Today's Vitals   04/22/21 2000 04/22/21 2100 04/22/21 2206 04/23/21 0014  BP: (!) 119/52 (!) 105/51 124/65 (!) 108/47  Pulse: 72 71 74 70  Resp: 16 16 17 14   Temp:    97.7 F (36.5 C)  TempSrc:    Oral  SpO2: 98% 96% 97% 97%  Weight:      Height:      PainSc:    0-No pain    Isolation Precautions No active isolations  Medications Medications  cefTRIAXone (ROCEPHIN) 1 g in sodium chloride 0.9 % 100 mL IVPB (has no administration in time range)  0.9 %  sodium chloride infusion ( Intravenous New Bag/Given 04/23/21 0013)  levothyroxine (SYNTHROID) tablet 25 mcg (has no administration in time range)  sodium chloride flush (NS) 0.9 % injection 3 mL (3 mLs Intravenous Not Given 04/22/21 2357)  acetaminophen (TYLENOL) tablet 650 mg (has no administration in time range)    Or  acetaminophen (TYLENOL) suppository 650 mg (has no administration in time range)  ketorolac (TORADOL) 15 MG/ML injection 15 mg (has no administration in time range)  polyethylene glycol (MIRALAX / GLYCOLAX) packet 17 g (has no administration in time range)  sodium chloride 0.9 % bolus 1,000 mL (0 mLs  Intravenous Stopped 04/22/21 1810)  ondansetron (ZOFRAN) injection 4 mg (4 mg Intravenous Given 04/22/21 1715)  morphine 4 MG/ML injection 4 mg (4 mg Intravenous Given 04/22/21 1715)  ketorolac (TORADOL) 30 MG/ML injection 15 mg (15 mg Intravenous Given 04/22/21 1715)  0.9 %  sodium chloride infusion ( Intravenous Stopped 04/23/21 0012)  cefTRIAXone (ROCEPHIN) 1 g in sodium chloride 0.9 % 100 mL IVPB (0 g Intravenous Stopped 04/23/21 0013)    Mobility walks Low fall risk   Focused Assessments    R Recommendations: See Admitting Provider Note  Report given to:   Additional Notes:

## 2021-04-23 NOTE — Anesthesia Procedure Notes (Signed)
Procedure Name: LMA Insertion Performed by: Sudie Grumbling, CRNA Pre-anesthesia Checklist: Patient identified, Emergency Drugs available, Suction available and Patient being monitored Patient Re-evaluated:Patient Re-evaluated prior to induction Oxygen Delivery Method: Circle system utilized Preoxygenation: Pre-oxygenation with 100% oxygen Induction Type: IV induction LMA: LMA with gastric port inserted LMA Size: 4.0 Number of attempts: 1 Airway Equipment and Method: Stylet and Oral airway Placement Confirmation: positive ETCO2 and breath sounds checked- equal and bilateral Tube secured with: Tape Dental Injury: Teeth and Oropharynx as per pre-operative assessment

## 2021-04-23 NOTE — ED Notes (Signed)
Messaged floor RN to see if they are ready for pt

## 2021-04-23 NOTE — Consult Note (Signed)
Urology Consult   Physician requesting consult: Kristine Royal, MD  Reason for consult: Right ureteral calculus  History of Present Illness: Linda Macdonald is a 80 y.o. female with a 1.2 cm right UVJ stone seen on CT from 04/22/2021.  She has a prior history of nephrolithiasis and has required surgery 2 times in the past.  Currently, she reports intermittent episodes of sharp, nonradiating right lower quadrant pain associated with nausea.  Her symptoms began approximately 24 hours ago during a flight home.   Past Medical History:  Diagnosis Date  . Arthritis   . Chronic kidney disease   . Depression   . Hair loss    d/t propranolol  . Headache   . Leukopenia 08/12/2012  . Thyroid disease   . Tremor     Past Surgical History:  Procedure Laterality Date  . EXTRACORPOREAL SHOCK WAVE LITHOTRIPSY Left 04/25/2017   Procedure: LEFT EXTRACORPOREAL SHOCK WAVE LITHOTRIPSY (ESWL);  Surgeon: Heloise Purpura, MD;  Location: [facility];  Service: Urology;  Laterality: Left;  . KNEE ARTHROSCOPY Left 2oyrs ago   L knee x2/ R knee x1  . LITHOTRIPSY  2012   difficulty voiding after procedure, d/c home with foley per pt  . TUBAL LIGATION    . WISDOM TOOTH EXTRACTION     teenager    Current Hospital Medications:  Home Meds:  Current Meds  Medication Sig  . Biotin w/ Vitamins C & E (HAIR/SKIN/NAILS PO) Take 1 packet by mouth at bedtime.  . clopidogrel (PLAVIX) 75 MG tablet Take 1 tablet by mouth daily.  Marland Kitchen estradiol (ESTRACE) 0.1 MG/GM vaginal cream Place 1 Applicatorful vaginally 2 (two) times a week.  . famotidine (PEPCID) 40 MG tablet Take 40 mg by mouth 2 (two) times daily.  Marland Kitchen levothyroxine (LEVOTHROID) 25 MCG tablet Take 1 tablet (25 mcg total) by mouth daily.  . Multiple Vitamins-Minerals (MULTIVITAMIN PO) Take 1 tablet by mouth daily.  Marland Kitchen spironolactone (ALDACTONE) 50 MG tablet Take 50 mg by mouth daily.    Scheduled Meds: . levothyroxine  25 mcg Oral Daily  . sodium chloride flush  3 mL  Intravenous Q12H   Continuous Infusions: . sodium chloride 100 mL/hr at 04/23/21 0127  . cefTRIAXone (ROCEPHIN)  IV     PRN Meds:.acetaminophen **OR** acetaminophen, ketorolac, polyethylene glycol  Allergies:  Allergies  Allergen Reactions  . Erythromycin Swelling  . Penicillins Swelling and Rash    Family History  Problem Relation Age of Onset  . Dementia Father   . Colon cancer Maternal Grandmother   . Other Son        salivary gland "tumor"    Social History:  reports that she has never smoked. She has never used smokeless tobacco. She reports current alcohol use. She reports that she does not use drugs.  ROS: A complete review of systems was performed.  All systems are negative except for pertinent findings as noted.  Physical Exam:  Vital signs in last 24 hours: Temp:  [97.7 F (36.5 C)-98.5 F (36.9 C)] 98.2 F (36.8 C) (05/15 0547) Pulse Rate:  [64-81] 66 (05/15 0547) Resp:  [13-19] 18 (05/15 0547) BP: (104-149)/(47-89) 104/78 (05/15 0547) SpO2:  [96 %-100 %] 99 % (05/15 0547) Weight:  [62.6 kg-67.1 kg] 67.1 kg (05/15 0108) Constitutional:  Alert and oriented, No acute distress Cardiovascular: Regular rate and rhythm, No JVD Respiratory: Normal respiratory effort, Lungs clear bilaterally GI: Abdomen is soft, nontender, nondistended, no abdominal masses GU: No CVA tenderness Lymphatic: No lymphadenopathy  Neurologic: Grossly intact, no focal deficits Psychiatric: Normal mood and affect  Laboratory Data:  Recent Labs    04/22/21 1355 04/23/21 0223  WBC 8.7 5.4  HGB 14.9 12.6  HCT 42.8 37.0  PLT 205 184    Recent Labs    04/22/21 1355 04/23/21 0223  NA 136 135  K 4.1 3.9  CL 102 108  GLUCOSE 149* 105*  BUN 27* 23  CALCIUM 9.7 8.1*  CREATININE 0.98 0.86     Results for orders placed or performed during the hospital encounter of 04/22/21 (from the past 24 hour(s))  CBC with Differential     Status: Abnormal   Collection Time: 04/22/21  1:55  PM  Result Value Ref Range   WBC 8.7 4.0 - 10.5 K/uL   RBC 4.43 3.87 - 5.11 MIL/uL   Hemoglobin 14.9 12.0 - 15.0 g/dL   HCT 81.1 91.4 - 78.2 %   MCV 96.6 80.0 - 100.0 fL   MCH 33.6 26.0 - 34.0 pg   MCHC 34.8 30.0 - 36.0 g/dL   RDW 95.6 21.3 - 08.6 %   Platelets 205 150 - 400 K/uL   nRBC 0.0 0.0 - 0.2 %   Neutrophils Relative % 92 %   Neutro Abs 8.0 (H) 1.7 - 7.7 K/uL   Lymphocytes Relative 5 %   Lymphs Abs 0.4 (L) 0.7 - 4.0 K/uL   Monocytes Relative 2 %   Monocytes Absolute 0.2 0.1 - 1.0 K/uL   Eosinophils Relative 0 %   Eosinophils Absolute 0.0 0.0 - 0.5 K/uL   Basophils Relative 0 %   Basophils Absolute 0.0 0.0 - 0.1 K/uL   Immature Granulocytes 1 %   Abs Immature Granulocytes 0.05 0.00 - 0.07 K/uL  Comprehensive metabolic panel     Status: Abnormal   Collection Time: 04/22/21  1:55 PM  Result Value Ref Range   Sodium 136 135 - 145 mmol/L   Potassium 4.1 3.5 - 5.1 mmol/L   Chloride 102 98 - 111 mmol/L   CO2 27 22 - 32 mmol/L   Glucose, Bld 149 (H) 70 - 99 mg/dL   BUN 27 (H) 8 - 23 mg/dL   Creatinine, Ser 5.78 0.44 - 1.00 mg/dL   Calcium 9.7 8.9 - 46.9 mg/dL   Total Protein 7.2 6.5 - 8.1 g/dL   Albumin 4.6 3.5 - 5.0 g/dL   AST 27 15 - 41 U/L   ALT 22 0 - 44 U/L   Alkaline Phosphatase 56 38 - 126 U/L   Total Bilirubin 0.6 0.3 - 1.2 mg/dL   GFR, Estimated 59 (L) >60 mL/min   Anion gap 7 5 - 15  Lipase, blood     Status: None   Collection Time: 04/22/21  1:55 PM  Result Value Ref Range   Lipase 48 11 - 51 U/L  Resp Panel by RT-PCR (Flu A&B, Covid) Nasopharyngeal Swab     Status: None   Collection Time: 04/22/21  4:35 PM   Specimen: Nasopharyngeal Swab; Nasopharyngeal(NP) swabs in vial transport medium  Result Value Ref Range   SARS Coronavirus 2 by RT PCR NEGATIVE NEGATIVE   Influenza A by PCR NEGATIVE NEGATIVE   Influenza B by PCR NEGATIVE NEGATIVE  Urinalysis, Routine w reflex microscopic     Status: Abnormal   Collection Time: 04/22/21 10:07 PM  Result Value  Ref Range   Color, Urine YELLOW YELLOW   APPearance HAZY (A) CLEAR   Specific Gravity, Urine 1.015 1.005 - 1.030  pH 6.0 5.0 - 8.0   Glucose, UA NEGATIVE NEGATIVE mg/dL   Hgb urine dipstick NEGATIVE NEGATIVE   Bilirubin Urine NEGATIVE NEGATIVE   Ketones, ur 5 (A) NEGATIVE mg/dL   Protein, ur NEGATIVE NEGATIVE mg/dL   Nitrite NEGATIVE NEGATIVE   Leukocytes,Ua LARGE (A) NEGATIVE   RBC / HPF 11-20 0 - 5 RBC/hpf   WBC, UA >50 (H) 0 - 5 WBC/hpf   Bacteria, UA RARE (A) NONE SEEN   Squamous Epithelial / LPF 0-5 0 - 5   Mucus PRESENT   Comprehensive metabolic panel     Status: Abnormal   Collection Time: 04/23/21  2:23 AM  Result Value Ref Range   Sodium 135 135 - 145 mmol/L   Potassium 3.9 3.5 - 5.1 mmol/L   Chloride 108 98 - 111 mmol/L   CO2 24 22 - 32 mmol/L   Glucose, Bld 105 (H) 70 - 99 mg/dL   BUN 23 8 - 23 mg/dL   Creatinine, Ser 0.10 0.44 - 1.00 mg/dL   Calcium 8.1 (L) 8.9 - 10.3 mg/dL   Total Protein 5.1 (L) 6.5 - 8.1 g/dL   Albumin 3.2 (L) 3.5 - 5.0 g/dL   AST 18 15 - 41 U/L   ALT 17 0 - 44 U/L   Alkaline Phosphatase 43 38 - 126 U/L   Total Bilirubin 0.6 0.3 - 1.2 mg/dL   GFR, Estimated >27 >25 mL/min   Anion gap 3 (L) 5 - 15  CBC     Status: Abnormal   Collection Time: 04/23/21  2:23 AM  Result Value Ref Range   WBC 5.4 4.0 - 10.5 K/uL   RBC 3.76 (L) 3.87 - 5.11 MIL/uL   Hemoglobin 12.6 12.0 - 15.0 g/dL   HCT 36.6 44.0 - 34.7 %   MCV 98.4 80.0 - 100.0 fL   MCH 33.5 26.0 - 34.0 pg   MCHC 34.1 30.0 - 36.0 g/dL   RDW 42.5 95.6 - 38.7 %   Platelets 184 150 - 400 K/uL   nRBC 0.0 0.0 - 0.2 %  Protime-INR     Status: None   Collection Time: 04/23/21  2:23 AM  Result Value Ref Range   Prothrombin Time 13.2 11.4 - 15.2 seconds   INR 1.0 0.8 - 1.2   Recent Results (from the past 240 hour(s))  Resp Panel by RT-PCR (Flu A&B, Covid) Nasopharyngeal Swab     Status: None   Collection Time: 04/22/21  4:35 PM   Specimen: Nasopharyngeal Swab; Nasopharyngeal(NP) swabs in  vial transport medium  Result Value Ref Range Status   SARS Coronavirus 2 by RT PCR NEGATIVE NEGATIVE Final    Comment: (NOTE) SARS-CoV-2 target nucleic acids are NOT DETECTED.  The SARS-CoV-2 RNA is generally detectable in upper respiratory specimens during the acute phase of infection. The lowest concentration of SARS-CoV-2 viral copies this assay can detect is 138 copies/mL. A negative result does not preclude SARS-Cov-2 infection and should not be used as the sole basis for treatment or other patient management decisions. A negative result may occur with  improper specimen collection/handling, submission of specimen other than nasopharyngeal swab, presence of viral mutation(s) within the areas targeted by this assay, and inadequate number of viral copies(<138 copies/mL). A negative result must be combined with clinical observations, patient history, and epidemiological information. The expected result is Negative.  Fact Sheet for Patients:  BloggerCourse.com  Fact Sheet for Healthcare Providers:  SeriousBroker.it  This test is no t yet approved or  cleared by the [place] and  has been authorized for detection and/or diagnosis of SARS-CoV-2 by FDA under an Emergency Use Authorization (EUA). This EUA will remain  in effect (meaning this test can be used) for the duration of the COVID-19 declaration under Section 564(b)(1) of the Act, 21 U.S.C.section 360bbb-3(b)(1), unless the authorization is terminated  or revoked sooner.       Influenza A by PCR NEGATIVE NEGATIVE Final   Influenza B by PCR NEGATIVE NEGATIVE Final    Comment: (NOTE) The Xpert Xpress SARS-CoV-2/FLU/RSV plus assay is intended as an aid in the diagnosis of influenza from Nasopharyngeal swab specimens and should not be used as a sole basis for treatment. Nasal washings and aspirates are unacceptable for Xpert Xpress SARS-CoV-2/FLU/RSV testing.  Fact  Sheet for Patients: BloggerCourse.com  Fact Sheet for Healthcare Providers: SeriousBroker.it  This test is not yet approved or cleared by the [place] FDA and has been authorized for detection and/or diagnosis of SARS-CoV-2 by FDA under an Emergency Use Authorization (EUA). This EUA will remain in effect (meaning this test can be used) for the duration of the COVID-19 declaration under Section 564(b)(1) of the Act, 21 U.S.C. section 360bbb-3(b)(1), unless the authorization is terminated or revoked.  Performed at [facility], [address], [place], [place] [address]     Renal Function: Recent Labs    04/22/21 1355 04/23/21 0223  CREATININE 0.98 0.86   Estimated Creatinine Clearance: 50 mL/min (by C-G formula based on SCr of 0.86 mg/dL).  Radiologic Imaging: CT Renal Stone Study  Result Date: 04/22/2021 CLINICAL DATA:  Lower abdominal and back pain with vomiting. EXAM: CT ABDOMEN AND PELVIS WITHOUT CONTRAST TECHNIQUE: Multidetector CT imaging of the abdomen and pelvis was performed following the standard protocol without IV contrast. COMPARISON:  MRI abdomen dated August 05, 2021. CT abdomen pelvis dated Apr 09, 2017. FINDINGS: Lower chest: No acute abnormality. Bibasilar linear atelectasis/scarring. Hepatobiliary: No focal liver abnormality is seen. No gallstones, gallbladder wall thickening, or biliary dilatation. Pancreas: Unchanged 1.4 cm low-density lesion in the pancreatic head. No ductal dilatation or surrounding inflammatory changes. Spleen: Normal in size without focal abnormality. Adrenals/Urinary Tract: Adrenal glands are unremarkable. There are three nonobstructive left renal calculi measuring up to 3 mm. Multiple, at least six, punctate calculi in the mid and lower poles of the right kidney. 1.2 cm calculus in the distal right ureter just proximal to the UVJ with resultant moderate to severe  hydroureteronephrosis. Mild right perinephric fat stranding. Subcentimeter low-density lesion in the lower pole of the left kidney is too small to characterize. The bladder is decompressed. Stomach/Bowel: Stomach is within normal limits. Appendix appears normal. No evidence of bowel wall thickening, obstruction, or inflammatory changes. The cecum is distended with stool. Vascular/Lymphatic: Aortic atherosclerosis. No enlarged abdominal or pelvic lymph nodes. Reproductive: Uterus and bilateral adnexa are unremarkable. Pessary device in place. Other: Trace free fluid in the pelvis.  No pneumoperitoneum. Musculoskeletal: No acute or significant osseous findings. IMPRESSION: 1. 1.2 cm calculus in the distal right ureter just proximal to the UVJ with resultant moderate to severe hydroureteronephrosis. 2. Additional bilateral nonobstructive nephrolithiasis. 3. Unchanged 1.4 cm low-density lesion in the pancreatic head, stable since prior MRI from August 2020, favoring benignity. 4. Aortic Atherosclerosis (ICD10-I70.0). Electronically Signed   By: Obie Dredge M.D.   On: 04/22/2021 15:06    I independently reviewed the above imaging studies.  Impression/Recommendation 80 year old female with an obstructing 1.2 cm right UVJ calculus associated  with moderate to severe hydronephrosis  The risks, benefits and alternatives of cystoscopy with RIGHT ureteroscopy, laser lithotripsy and ureteral stent placement was discussed the patient.  Risks included, but are not limited to: bleeding, urinary tract infection, ureteral injury/avulsion, ureteral stricture formation, retained stone fragments, the possibility that multiple surgeries may be required to treat the stone(s), MI, stroke, PE and the inherent risks of general anesthesia.  The patient voices understanding and wishes to proceed.     Rhoderick Moodyhristopher Alix Lahmann, MD [facility] 04/23/2021, 7:26 AM

## 2021-04-24 ENCOUNTER — Encounter (HOSPITAL_COMMUNITY): Payer: Self-pay | Admitting: Urology

## 2021-04-24 NOTE — Anesthesia Postprocedure Evaluation (Signed)
Anesthesia Post Note  Patient: AUTYMN OMLOR  Procedure(s) Performed: CYSTOSCOPY/URETEROSCOPY/HOLMIUM LASER/STENT PLACEMENT (Right Ureter)     Patient location during evaluation: PACU Anesthesia Type: General Level of consciousness: awake and alert Pain management: pain level controlled Vital Signs Assessment: post-procedure vital signs reviewed and stable Respiratory status: spontaneous breathing, nonlabored ventilation, respiratory function stable and patient connected to nasal cannula oxygen Cardiovascular status: blood pressure returned to baseline and stable Postop Assessment: no apparent nausea or vomiting Anesthetic complications: no   No complications documented.  Last Vitals:  Vitals:   04/23/21 0927 04/23/21 0946  BP: (!) 106/49 (!) 111/51  Pulse: 77 77  Resp: 14 18  Temp: 36.6 C 36.5 C  SpO2: 100% 98%    Last Pain:  Vitals:   04/23/21 0946  TempSrc: Oral  PainSc:                  Aisa Schoeppner L Ileanna Gemmill

## 2021-08-25 ENCOUNTER — Other Ambulatory Visit: Payer: Self-pay | Admitting: Gastroenterology

## 2021-08-25 DIAGNOSIS — K869 Disease of pancreas, unspecified: Secondary | ICD-10-CM

## 2021-09-13 ENCOUNTER — Other Ambulatory Visit: Payer: Self-pay

## 2021-09-13 ENCOUNTER — Ambulatory Visit
Admission: RE | Admit: 2021-09-13 | Discharge: 2021-09-13 | Disposition: A | Discharge status: Home / Self Care | Payer: Medicare Other | Source: Ambulatory Visit | Attending: Gastroenterology | Admitting: Gastroenterology

## 2021-09-13 DIAGNOSIS — K869 Disease of pancreas, unspecified: Secondary | ICD-10-CM

## 2021-09-13 MED ORDER — GADOBENATE DIMEGLUMINE 529 MG/ML IV SOLN: 20.0000 mL | Freq: Once | INTRAVENOUS | Status: DC | PRN

## 2021-09-13 MED ORDER — GADOBENATE DIMEGLUMINE 529 MG/ML IV SOLN
12.0000 mL | Freq: Once | INTRAVENOUS | Status: AC | PRN
  Administered 2021-09-13: 12 mL via INTRAVENOUS

## 2021-11-27 ENCOUNTER — Telehealth: Payer: Self-pay | Admitting: Oncology

## 2021-11-27 NOTE — Telephone Encounter (Signed)
[facility] Scheduling Phone Note  I contacted Linda Macdonald regarding a referral from Dr.Mann for purpose of evaluation and work up of "abnormal MRI" from 09/13/2021.  Linda Macdonald was not aware of her referral and reason.  Information on reason for referral was necessary - from the scanned documents in Media tab, the referral is related to "cystic pancreatic lesions" and the patient also has elevated Iron and Ferritin levels that it was unclear if the referral was related to as well.  Linda Macdonald is going to contact Dr. Loreta Ave for further clarity.  Regardless, I did schedule her for a Diagnostic Clinic appointment with the understanding she can call to cancel or reschedule based on her discussion with Dr. Loreta Ave.  Patient was informed about the Diagnostic Clinic and the intent being to complete a diagnostic/prognostic work-up for her suspicious findings.  She is aware her appointment is with our Physician Assistant to identify a diagnostic plan of care and to arrange further oncologist or specialist care as indicated.  I confirmed with Linda Macdonald she has transportation to her Diagnostic Clinic appointment.  Patient is aware to bring a list of medications, as well as insurance cards.  Visitor policy and COVID protocol reviewed with patient as well, including what to expect on arrival to the [facility] regarding check-in process, visit, and potential for labs afterwards.  We look forward to meeting Linda Macdonald and completing her diagnostic work-up and arranging her subsequent appointment with our oncologist team.  Diagnostic Clinic Specific Info:  Best contact/way to contact patient:  [phone] [facility] updated to reflect?:  not applicable Is there a HC POA?:  No  Location of Diagnostic Clinic Appointment and Contact Info Provided to Patient:  [facility] at [facility] [address] [place], [place] [address] [phone]  Reason for Referral, Clinical Information:  09/13/2021 MRI Abdomen IMPRESSION: Profound hepatic iron deposition, associated splenic iron deposition, unchanged compared to most recent MR comparison from August of 2020.   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 Lesions within 1 mm of previous size without current high-risk features. Two year follow-up MRI/MRCP is suggested based on current guidelines.   There are also abnormal Iron and Ferritin labs in the Media Tab [facility] New Patient documents scanned on 11/24/2021 1006 that suggest there may be a hematology component to her referral as well.

## 2021-12-08 ENCOUNTER — Inpatient Hospital Stay: Payer: Medicare Other

## 2021-12-08 ENCOUNTER — Other Ambulatory Visit: Payer: Self-pay

## 2021-12-08 ENCOUNTER — Inpatient Hospital Stay: Payer: Medicare Other | Attending: Physician Assistant | Admitting: Physician Assistant

## 2021-12-08 ENCOUNTER — Encounter: Payer: Self-pay | Admitting: Physician Assistant

## 2021-12-08 DIAGNOSIS — Z800 Family history of malignant neoplasm of digestive organs: Secondary | ICD-10-CM | POA: Diagnosis not present

## 2021-12-08 DIAGNOSIS — K862 Cyst of pancreas: Secondary | ICD-10-CM | POA: Diagnosis not present

## 2021-12-08 DIAGNOSIS — Z808 Family history of malignant neoplasm of other organs or systems: Secondary | ICD-10-CM | POA: Insufficient documentation

## 2021-12-08 LAB — CBC WITH DIFFERENTIAL (CANCER CENTER ONLY)
Abs Immature Granulocytes: 0.02 10*3/uL (ref 0.00–0.07)
Basophils Absolute: 0 10*3/uL (ref 0.0–0.1)
Basophils Relative: 1 %
Eosinophils Absolute: 0.2 10*3/uL (ref 0.0–0.5)
Eosinophils Relative: 3 %
HCT: 41 % (ref 36.0–46.0)
Hemoglobin: 14.1 g/dL (ref 12.0–15.0)
Immature Granulocytes: 0 %
Lymphocytes Relative: 19 %
Lymphs Abs: 1.3 10*3/uL (ref 0.7–4.0)
MCH: 33.5 pg (ref 26.0–34.0)
MCHC: 34.4 g/dL (ref 30.0–36.0)
MCV: 97.4 fL (ref 80.0–100.0)
Monocytes Absolute: 0.6 10*3/uL (ref 0.1–1.0)
Monocytes Relative: 8 %
Neutro Abs: 5 10*3/uL (ref 1.7–7.7)
Neutrophils Relative %: 69 %
Platelet Count: 233 10*3/uL (ref 150–400)
RBC: 4.21 MIL/uL (ref 3.87–5.11)
RDW: 12.4 % (ref 11.5–15.5)
WBC Count: 7.1 10*3/uL (ref 4.0–10.5)
nRBC: 0 % (ref 0.0–0.2)

## 2021-12-08 LAB — CMP (CANCER CENTER ONLY)
ALT: 22 U/L (ref 0–44)
AST: 23 U/L (ref 15–41)
Albumin: 4.2 g/dL (ref 3.5–5.0)
Alkaline Phosphatase: 60 U/L (ref 38–126)
Anion gap: 7 (ref 5–15)
BUN: 17 mg/dL (ref 8–23)
CO2: 27 mmol/L (ref 22–32)
Calcium: 9.6 mg/dL (ref 8.9–10.3)
Chloride: 104 mmol/L (ref 98–111)
Creatinine: 0.85 mg/dL (ref 0.44–1.00)
GFR, Estimated: >60 mL/min (ref >60)
Glucose, Bld: 98 mg/dL (ref 70–99)
Potassium: 4 mmol/L (ref 3.5–5.1)
Sodium: 138 mmol/L (ref 135–145)
Total Bilirubin: 0.4 mg/dL (ref 0.3–1.2)
Total Protein: 7 g/dL (ref 6.5–8.1)

## 2021-12-08 LAB — IRON AND IRON BINDING CAPACITY (CC-WL,HP ONLY)
Iron: 67 ug/dL (ref 28–170)
Saturation Ratios: 25 % (ref 10.4–31.8)
TIBC: 270 ug/dL (ref 250–450)
UIBC: 203 ug/dL

## 2021-12-08 LAB — FERRITIN: Ferritin: 173 ng/mL (ref 11–307)

## 2021-12-08 NOTE — Progress Notes (Signed)
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 female with medical history significant for arthritis, chronic kidney disease, headaches, poor thyroidism and tremors.  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  On exam today, Linda Macdonald reports that her energy levels are unchanged in the last several months. She is able to complete all her daily activities on her own. She denies any recent appetite or weight changes. She denies nausea, vomiting or abdominal pain. Her bowel habits are regular without diarrhea or constipation. She denies any recent IV iron or blood transfusions. She is not taking any iron supplementations. She denies fevers, chills, night sweats, shortness of breath, chest pain or cough. She has no other complaints. Rest of the 10 point ROS is below.   MEDICAL HISTORY:  Past Medical History:  Diagnosis Date   Arthritis    Chronic kidney disease    Depression    Hair loss    d/t propranolol   Headache    Leukopenia 08/12/2012   Thyroid disease    Tremor     SURGICAL HISTORY: Past Surgical History:  Procedure Laterality Date   CYSTOSCOPY/URETEROSCOPY/HOLMIUM LASER/STENT PLACEMENT Right 04/23/2021   Procedure: CYSTOSCOPY/URETEROSCOPY/HOLMIUM LASER/STENT PLACEMENT;  Surgeon: Ceasar Mons, MD;  Location: [facility];  Service: Urology;  Laterality: Right;   EXTRACORPOREAL SHOCK WAVE LITHOTRIPSY Left 04/25/2017   Procedure: LEFT EXTRACORPOREAL SHOCK WAVE LITHOTRIPSY (ESWL);  Surgeon: Raynelle Bring, MD;  Location: [facility];  Service: Urology;  Laterality: Left;   KNEE ARTHROSCOPY Left 2oyrs ago   L knee x2/ R knee x1   LITHOTRIPSY  2012   difficulty voiding after procedure, d/c home with foley per pt   TUBAL LIGATION     WISDOM TOOTH EXTRACTION     teenager    SOCIAL HISTORY: Social History   Socioeconomic History   Marital status: Widowed    Spouse name: Not on file   Number of children: 3   Years of education: College   Highest education level: Not on file  Occupational History   Not on file  Tobacco Use   Smoking status: Never   Smokeless tobacco: Never  Vaping Use   Vaping Use: Never used  Substance and Sexual Activity   Alcohol use: Yes    Comment: socially   Drug use: No   Sexual activity: Not on file  Other Topics Concern   Not on file  Social History Narrative   Patient is a widow and lives alone with her dog.   Patient has three children.   Patient is retired.   Patient has a college education.   Patient is right-handed.   Caffeine: Patient drinks 1 cup of coffee 2-3 times per week      Social Determinants of Health   Financial Resource Strain: Not on file  Food Insecurity: Not on file  Transportation Needs: Not on file  Physical Activity: Not on file  Stress: Not on file  Social Connections: Not on file  Intimate Partner Violence: Not on file    FAMILY HISTORY: Family History  Problem Relation Age of Onset   Dementia Father    Colon cancer Maternal Grandmother    Other Son        salivary gland "tumor"    ALLERGIES:  is allergic to erythromycin and penicillins.  MEDICATIONS:  Current Outpatient Medications  Medication Sig Dispense Refill   Biotin w/ Vitamins C & E (HAIR/SKIN/NAILS PO) Take 1 packet by mouth at bedtime.     clopidogrel (PLAVIX) 75 MG tablet Take 1 tablet by mouth daily.     estradiol (ESTRACE) 0.1 MG/GM vaginal cream Place 1 Applicatorful vaginally 2 (two) times a week.     famotidine (PEPCID) 40 MG tablet Take  40 mg by mouth 2 (two) times daily.     HYDROcodone-acetaminophen (NORCO) 5-325 MG tablet Take 1 tablet by mouth every 4 (four) hours as needed for moderate pain. 20 tablet 0   levothyroxine (LEVOTHROID) 25 MCG tablet Take 1 tablet (25 mcg total) by mouth daily. 90 tablet 3   Multiple Vitamins-Minerals (MULTIVITAMIN PO) Take 1 tablet by mouth daily.     oxybutynin (DITROPAN) 5 MG tablet Take 1 tablet (5 mg total) by mouth every 8 (eight) hours as needed for bladder spasms. 30 tablet 1   phenazopyridine (PYRIDIUM) 200 MG tablet Take 1 tablet (200 mg total) by mouth 3 (three) times daily as needed (for pain with urination). 30 tablet 0   spironolactone (ALDACTONE) 50 MG tablet Take 50 mg by mouth daily.     No current facility-administered medications for this visit.    REVIEW OF SYSTEMS:   Constitutional: ( - ) fevers, ( - )  chills , ( - ) night sweats Eyes: ( - ) blurriness of vision, ( - ) double vision, ( - ) watery eyes Ears, nose, mouth, throat, and face: ( - ) mucositis, ( - ) sore throat Respiratory: ( - ) cough, ( - ) dyspnea, ( - ) wheezes Cardiovascular: ( - ) palpitation, ( - ) chest discomfort, ( - ) lower extremity swelling Gastrointestinal:  ( - ) nausea, ( - ) heartburn, ( - ) change in bowel habits Skin: ( - ) abnormal skin rashes Lymphatics: ( - ) new lymphadenopathy, ( - ) easy bruising Neurological: ( - ) numbness, ( - ) tingling, ( - ) new weaknesses Behavioral/Psych: ( - ) mood change, ( - ) new changes  All other systems were reviewed with the patient and are negative.  PHYSICAL EXAMINATION: ECOG PERFORMANCE STATUS: 0 - Asymptomatic  There were no vitals filed for this visit. There were no vitals filed for this visit.  GENERAL: well appearing female in NAD  SKIN: skin color, texture, turgor are normal, no rashes or significant lesions EYES: conjunctiva are pink and non-injected, sclera clear OROPHARYNX: no exudate, no erythema; lips, buccal mucosa, and tongue  normal  NECK: supple, non-tender LYMPH:  no palpable lymphadenopathy in the cervical or supraclavicular lymph nodes.  LUNGS: clear to auscultation and percussion with normal breathing effort HEART: regular rate & rhythm and no murmurs and no lower extremity edema ABDOMEN: soft, non-tender, non-distended, normal bowel sounds Musculoskeletal: no cyanosis of digits and no clubbing  PSYCH: alert & oriented x 3, fluent speech NEURO: no focal motor/sensory deficits  LABORATORY DATA:  I have reviewed the data as listed CBC Latest Ref Rng & Units 04/23/2021 04/22/2021 03/25/2018  WBC 4.0 - 10.5 K/uL 5.4 8.7 3.8(L)  Hemoglobin 12.0 - 15.0 g/dL 12.6 14.9 14.9  Hematocrit 36.0 - 46.0 % 37.0 42.8 42.3  Platelets 150 - 400 K/uL 184 205 189    CMP Latest Ref Rng & Units 04/23/2021 04/22/2021 03/25/2018  Glucose 70 - 99 mg/dL 701(X) 793(J) 030(S)  BUN 8 - 23 mg/dL 23 92(Z) 14  Creatinine 0.44 - 1.00 mg/dL 3.00 7.62 2.63  Sodium 135 - 145 mmol/L 135 136 136  Potassium 3.5 - 5.1 mmol/L 3.9 4.1 4.7  Chloride 98 - 111 mmol/L 108 102 102  CO2 22 - 32 mmol/L 24 27 25   Calcium 8.9 - 10.3 mg/dL 8.1(L) 9.7 9.6  Total Protein 6.5 - 8.1 g/dL 5.1(L) 7.2 -  Total Bilirubin 0.3 - 1.2 mg/dL 0.6 0.6 -  Alkaline Phos 38 - 126 U/L 43 56 -  AST 15 - 41 U/L 18 27 -  ALT 0 - 44 U/L 17 22 -   RADIOGRAPHIC STUDIES: I have personally reviewed the radiological images as listed and agreed with the findings in the report. No results found.  09/13/2021: MRCP: Profound hepatic iron deposition, associated splenic iron deposition, unchanged compared to most recent MR comparison from August of 2020. 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 Lesions within 1 mm of previous size without current high-risk features. Two year follow-up MRI/MRCP is suggested based on current guidelines. Sludge in the gallbladder.  ASSESSMENT & PLAN ZURRI RUDDEN is a 80 y.o. female who presents to the clinic for initial evaluation for iron overload involving the liver and spleen. Patient is not taking any iron supplementation and denies any history of IV blood or iron infusions.   We will proceed with serologic workup with CBC, CMP, iron and TIBC, ferritin and hemochromatosis panel. If workup is negative, we recommend a liver biopsy to evaluate extent of iron deposition and rule out liver disease.   #Iron overload: --Seen on most recent MRI on 09/13/2021. Etiology unknown.  --Labs today to check  CBC, CMP,iron and TIBC, ferritin and hemochromatosis panel. --Advised to minimize iron rich foods and avoid iron supplementation at this time.  --RTC based on above workup.   #Cystic pancreatic lesions: --Stable over several years.  --Continue to monitor with serial imaging.   No orders of the defined types were placed in this encounter.   All questions were answered. The patient knows to call the clinic with any problems, questions or concerns.  I have spent a total of 60 minutes minutes of face-to-face and non-face-to-face time, preparing to see the patient, obtaining and/or reviewing separately obtained history, performing a medically appropriate examination, counseling and educating the patient, ordering tests,documenting clinical information in the electronic health record, and care coordination.   11/13/2021, PA-C Department of Hematology/Oncology [facility] Phone: [phone]

## 2021-12-14 ENCOUNTER — Telehealth: Payer: Self-pay | Admitting: Physician Assistant

## 2021-12-14 LAB — HEMOCHROMATOSIS DNA-PCR(C282Y,H63D)

## 2021-12-14 NOTE — Telephone Encounter (Signed)
I spoke to Linda Macdonald by phone to review the lab results from 12/08/2021. 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  CBC and CMP were unremarkable.  Ferritin levels were mildly elevated at 173 ng/mL.   Due to marked iron deposition in the liver and spleen from recent MRI, the recommendation is to proceed with therapeutic phlebotomy with a frequency of once every other week.  Explained the goal of phlebotomy is to reach ferritin levels less than 50.  We will need additional labs to check thyroid and pancreatic functions.  Additionally, we will need baseline echocardiogram to rule out iron overload in the heart.  Patient will return on Monday, 12/18/2021 for labs and we will arrange for echocardiogram next week.  Patient expressed understanding and satisfaction with the plan provided.

## 2021-12-15 ENCOUNTER — Telehealth: Payer: Self-pay | Admitting: Physician Assistant

## 2021-12-15 ENCOUNTER — Encounter: Payer: Self-pay | Admitting: Physician Assistant

## 2021-12-15 ENCOUNTER — Other Ambulatory Visit: Payer: Self-pay | Admitting: Physician Assistant

## 2021-12-15 NOTE — Telephone Encounter (Signed)
Scheduled per 1/6 secure chat, message was left with pt

## 2021-12-18 ENCOUNTER — Other Ambulatory Visit: Payer: Self-pay

## 2021-12-18 ENCOUNTER — Other Ambulatory Visit: Payer: Medicare Other

## 2021-12-18 ENCOUNTER — Inpatient Hospital Stay: Payer: Medicare Other | Attending: Physician Assistant

## 2021-12-18 ENCOUNTER — Other Ambulatory Visit (HOSPITAL_COMMUNITY): Payer: Medicare Other

## 2021-12-18 DIAGNOSIS — Z800 Family history of malignant neoplasm of digestive organs: Secondary | ICD-10-CM | POA: Diagnosis not present

## 2021-12-18 DIAGNOSIS — Z79899 Other long term (current) drug therapy: Secondary | ICD-10-CM | POA: Insufficient documentation

## 2021-12-18 LAB — TSH: TSH: 1.637 u[IU]/mL (ref 0.308–3.960)

## 2021-12-18 LAB — CBC WITH DIFFERENTIAL (CANCER CENTER ONLY)
Abs Immature Granulocytes: 0.01 10*3/uL (ref 0.00–0.07)
Basophils Absolute: 0 10*3/uL (ref 0.0–0.1)
Basophils Relative: 1 %
Eosinophils Absolute: 0.1 10*3/uL (ref 0.0–0.5)
Eosinophils Relative: 2 %
HCT: 39.4 % (ref 36.0–46.0)
Hemoglobin: 13.7 g/dL (ref 12.0–15.0)
Immature Granulocytes: 0 %
Lymphocytes Relative: 26 %
Lymphs Abs: 1.4 10*3/uL (ref 0.7–4.0)
MCH: 33.4 pg (ref 26.0–34.0)
MCHC: 34.8 g/dL (ref 30.0–36.0)
MCV: 96.1 fL (ref 80.0–100.0)
Monocytes Absolute: 0.3 10*3/uL (ref 0.1–1.0)
Monocytes Relative: 6 %
Neutro Abs: 3.4 10*3/uL (ref 1.7–7.7)
Neutrophils Relative %: 65 %
Platelet Count: 265 10*3/uL (ref 150–400)
RBC: 4.1 MIL/uL (ref 3.87–5.11)
RDW: 11.9 % (ref 11.5–15.5)
WBC Count: 5.3 10*3/uL (ref 4.0–10.5)
nRBC: 0 % (ref 0.0–0.2)

## 2021-12-18 LAB — FERRITIN: Ferritin: 207 ng/mL (ref 11–307)

## 2021-12-18 LAB — HEMOGLOBIN A1C
Hgb A1c MFr Bld: 4.9 % (ref 4.8–5.6)
Mean Plasma Glucose: 93.93 mg/dL

## 2021-12-18 LAB — C-REACTIVE PROTEIN: CRP: 0.8 mg/dL (ref <1.0)

## 2021-12-18 LAB — SEDIMENTATION RATE: Sed Rate: 10 mm/hr (ref 0–22)

## 2021-12-19 ENCOUNTER — Telehealth: Payer: Self-pay

## 2021-12-19 NOTE — Telephone Encounter (Signed)
Pt advised with understanding.  She wanted to add that she has been cooking everything in a cast iron skillet and has been taking an extra amt of Vitamin C in her B complex vitamins and her hair serum

## 2021-12-19 NOTE — Telephone Encounter (Signed)
-----   Message from Lincoln Brigham, PA-C sent at 12/19/2021  2:31 PM EST ----- Please notify patient that labs from 12/18/2021 require not further intervention. TSH, Hemolgobin A1C and inflammatory markers were normal. Plan to proceed with therapeutic phlebotomy on Friday as scheduled.

## 2021-12-21 ENCOUNTER — Telehealth: Payer: Self-pay | Admitting: Physician Assistant

## 2021-12-21 ENCOUNTER — Telehealth: Payer: Self-pay

## 2021-12-21 ENCOUNTER — Encounter: Payer: Self-pay | Admitting: Physician Assistant

## 2021-12-21 NOTE — Telephone Encounter (Signed)
I called back Linda Macdonald today to review our conversation from 12/14/2021 which detailed the diagnosis of hereditary hemochromatosis and treatment plan for therapeutic phlebotomy every 2 weeks.  Additionally, I reviewed the labs from this Monday that show TSH level, hemoglobin A1c and inflammatory markers were unremarkable.  We will await baseline echocardiogram on 12/29/2021 to evaluate for iron deposition in the heart.  Additionally, we faxed our lab results and recommendations to patient's care team including Dr. Darron Doom (PCP), Dr. Collene Mares (GI), and Dr. Benjie Karvonen (OB/GYN).   Patient is scheduled for her first therapeutic phlebotomy tomorrow, 12/22/2021 with a goal of ferritin less than 50.  Patient will return for a follow-up visit with Dr. Lorenso Courier, labs and another phlebotomy session on 01/05/2022.  Patient requested for our recommendations to be sent through [place] so she can review with her family members.  Patient has no other questions or concerns and expressed understanding of the plan provided

## 2021-12-21 NOTE — Telephone Encounter (Signed)
T/C from pt requesting a call regarding questions about the phlebotomy scheduled for 12/22/21 and a dx of hemochromatosis that she said no one has discussed with her.  She can be reached at [phone]

## 2021-12-22 ENCOUNTER — Inpatient Hospital Stay: Payer: Medicare Other

## 2021-12-22 ENCOUNTER — Other Ambulatory Visit: Payer: Self-pay

## 2021-12-22 NOTE — Progress Notes (Signed)
Zenda Herskowitz [place] presents today for phlebotomy per MD orders. 16 g. Kit to L antecubital.  Phlebotomy procedure started at 1532 and ended at 1544. 530 grams removed. Patient observed for 30 minutes after procedure without any incident. Patient tolerated procedure well. IV needle removed intact.

## 2021-12-22 NOTE — Progress Notes (Signed)
Pt post 30 min observation. Tolerated well, VSS and discharged in stable condition. All questions answered and AVS reviewed.

## 2021-12-22 NOTE — Patient Instructions (Signed)
Therapeutic Phlebotomy °Therapeutic phlebotomy is the planned removal of blood from a person's body for the purpose of treating a medical condition. The procedure is lot like donating blood. Usually, about a pint (470 mL, or 0.47 L) of blood is removed. The average adult has 9-12 pints (4.3-5.7 L) of blood in his or her body. °Therapeutic phlebotomy may be used to treat the following medical conditions: °Hemochromatosis. This is a condition in which the blood contains too much iron. °Polycythemia vera. This is a condition in which the blood contains too many red blood cells. °Porphyria cutanea tarda. This is a disease in which an important part of hemoglobin is not made properly. It results in the buildup of abnormal amounts of porphyrins in the body. °Sickle cell disease. This is a condition in which the red blood cells form an abnormal crescent shape rather than a round shape. °Tell a health care provider about: °Any allergies you have. °All medicines you are taking, including vitamins, herbs, eye drops, creams, and over-the-counter medicines. °Any bleeding problems you have. °Any surgeries you have had. °Any medical conditions you have. °Whether you are pregnant or may be pregnant. °What are the risks? °Generally, this is a safe procedure. However, problems may occur, including: °Nausea or light-headedness. °Low blood pressure (hypotension). °Soreness, bleeding, swelling, or bruising at the needle insertion site. °Infection. °What happens before the procedure? °Ask your health care provider about: °Changing or stopping your regular medicines. This is especially important if you are taking diabetes medicines or blood thinners. °Taking medicines such as aspirin and ibuprofen. These medicines can thin your blood. Do not take these medicines unless your health care provider tells you to take them. °Taking over-the-counter medicines, vitamins, herbs, and supplements. °Wear clothing with sleeves that can be raised  above the elbow. °You may have a blood sample taken. °Your blood pressure, pulse rate, and breathing rate will be measured. °What happens during the procedure? ° °You may be given a medicine to numb the area (local anesthetic). °A tourniquet will be placed on your arm. °A needle will be put into one of your veins. °Tubing and a collection bag will be attached to the needle. °Blood will flow through the needle and tubing into the collection bag. °The collection bag will be placed lower than your arm so gravity can help the blood flow into the bag. °You may be asked to open and close your hand slowly and continually during the entire collection. °After the specified amount of blood has been removed from your body, the collection bag and tubing will be clamped. °The needle will be removed from your vein. °Pressure will be held on the needle site to stop the bleeding. °A bandage (dressing) will be placed over the needle insertion site. °The procedure may vary among health care providers and hospitals. °What happens after the procedure? °Your blood pressure, pulse rate, and breathing rate will be measured after the procedure. °You will be encouraged to drink fluids. °You will be encouraged to eat a snack to prevent a low blood sugar level. °Your recovery will be assessed and monitored. °Return to your normal activities as told by your health care provider. °Summary °Therapeutic phlebotomy is the planned removal of blood from a person's body for the purpose of treating a medical condition. °Therapeutic phlebotomy may be used to treat hemochromatosis, polycythemia vera, porphyria cutanea tarda, or sickle cell disease. °In the procedure, a needle is inserted and about a pint (470 mL, or 0.47 L) of blood is   removed. The average adult has 9-12 pints (4.3-5.7 L) of blood in the body. °This is generally a safe procedure, but it can sometimes cause problems such as nausea, light-headedness, or low blood pressure  (hypotension). °This information is not intended to replace advice given to you by your health care provider. Make sure you discuss any questions you have with your health care provider. °Document Revised: 05/24/2021 Document Reviewed: 05/24/2021 °Elsevier Patient Education © 2022 [employer] ° °

## 2021-12-29 ENCOUNTER — Other Ambulatory Visit: Payer: Self-pay

## 2021-12-29 ENCOUNTER — Ambulatory Visit (HOSPITAL_COMMUNITY)
Admission: RE | Admit: 2021-12-29 | Discharge: 2021-12-29 | Disposition: A | Discharge status: Home / Self Care | Payer: Medicare Other | Source: Ambulatory Visit | Attending: Physician Assistant | Admitting: Physician Assistant

## 2021-12-29 DIAGNOSIS — E039 Hypothyroidism, unspecified: Secondary | ICD-10-CM | POA: Diagnosis not present

## 2021-12-29 DIAGNOSIS — N189 Chronic kidney disease, unspecified: Secondary | ICD-10-CM | POA: Insufficient documentation

## 2021-12-29 LAB — ECHOCARDIOGRAM COMPLETE
Area-P 1/2: 3.99 cm2
S' Lateral: 2.8 cm

## 2021-12-29 NOTE — Progress Notes (Signed)
Echocardiogram 2D Echocardiogram has been performed.  Linda Macdonald 12/29/2021, 9:52 AM

## 2022-01-01 ENCOUNTER — Telehealth: Payer: Self-pay

## 2022-01-01 NOTE — Telephone Encounter (Signed)
Pt advised and verbalized understanding.

## 2022-01-01 NOTE — Telephone Encounter (Signed)
-----   Message from Lincoln Brigham, PA-C sent at 01/01/2022 10:12 AM EST ----- Please notify patient that echo does not show evidence of iron deposition to the heart.

## 2022-01-05 ENCOUNTER — Other Ambulatory Visit: Payer: Self-pay

## 2022-01-05 ENCOUNTER — Inpatient Hospital Stay: Payer: Medicare Other | Admitting: Hematology and Oncology

## 2022-01-05 ENCOUNTER — Inpatient Hospital Stay: Payer: Medicare Other

## 2022-01-05 LAB — CBC WITH DIFFERENTIAL (CANCER CENTER ONLY)
Abs Immature Granulocytes: 0.02 10*3/uL (ref 0.00–0.07)
Basophils Absolute: 0 10*3/uL (ref 0.0–0.1)
Basophils Relative: 1 %
Eosinophils Absolute: 0.1 10*3/uL (ref 0.0–0.5)
Eosinophils Relative: 4 %
HCT: 37.8 % (ref 36.0–46.0)
Hemoglobin: 13.1 g/dL (ref 12.0–15.0)
Immature Granulocytes: 1 %
Lymphocytes Relative: 30 %
Lymphs Abs: 1.1 10*3/uL (ref 0.7–4.0)
MCH: 33.5 pg (ref 26.0–34.0)
MCHC: 34.7 g/dL (ref 30.0–36.0)
MCV: 96.7 fL (ref 80.0–100.0)
Monocytes Absolute: 0.3 10*3/uL (ref 0.1–1.0)
Monocytes Relative: 8 %
Neutro Abs: 2.1 10*3/uL (ref 1.7–7.7)
Neutrophils Relative %: 56 %
Platelet Count: 212 10*3/uL (ref 150–400)
RBC: 3.91 MIL/uL (ref 3.87–5.11)
RDW: 12.3 % (ref 11.5–15.5)
WBC Count: 3.7 10*3/uL — ABNORMAL LOW (ref 4.0–10.5)
nRBC: 0 % (ref 0.0–0.2)

## 2022-01-05 LAB — FERRITIN: Ferritin: 144 ng/mL (ref 11–307)

## 2022-01-05 NOTE — Progress Notes (Signed)
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 Testing showed ferritin 173 and genetic testing consistent with  Homozygous C282Y 12/22/2021: Started phlebotomy. Treatment #1  Interval History:  Linda Macdonald 81 y.o. female with medical history significant for hereditary hemochromatosis presents for a follow up visit. The patient's last visit was on 12/08/2021 at which time she established care. In the interim since the last visit she has started phlebotomy.  On exam today Linda Macdonald reports that she tolerated her first phlebotomy 2 weeks ago very well.  She notes did not have any lightheadedness, dizziness, or fatigue.  She notes that she remains quite active and typically walks 2 half hour walks per day and does 45-minute exercise sessions.  She reports that she is not having any issues with fevers, chills, sweats, nausea, ming or diarrhea.  Overall she feels well and has no questions comments or concerns.  The bulk of our discussion focused on the diagnosis of hereditary hemochromatosis and the treatment moving forward.  She voiced understanding of the treatment plan and the reasoning behind it.  MEDICAL HISTORY:  Past Medical History:  Diagnosis Date   Arthritis    Chronic kidney disease    Depression    Hair loss    d/t propranolol   hypothyroidism    Leukopenia 08/12/2012    SURGICAL HISTORY: Past Surgical History:  Procedure Laterality Date   CYSTOSCOPY/URETEROSCOPY/HOLMIUM LASER/STENT PLACEMENT Right  04/23/2021   Procedure: CYSTOSCOPY/URETEROSCOPY/HOLMIUM LASER/STENT PLACEMENT;  Surgeon: Ceasar Mons, MD;  Location: [facility];  Service: Urology;  Laterality: Right;   EXTRACORPOREAL SHOCK WAVE LITHOTRIPSY Left 04/25/2017   Procedure: LEFT EXTRACORPOREAL SHOCK WAVE LITHOTRIPSY (ESWL);  Surgeon: Raynelle Bring, MD;  Location: [facility];  Service: Urology;  Laterality: Left;   KNEE ARTHROSCOPY Left 2oyrs ago   L knee x2/ R knee x1   LITHOTRIPSY  2012   difficulty voiding after procedure, d/c home with foley per pt   TUBAL LIGATION     WISDOM TOOTH EXTRACTION     teenager    SOCIAL HISTORY: Social History   Socioeconomic History   Marital status: Widowed    Spouse name: Not on file   Number of children: 3   Years of education: College   Highest education level: Not on file  Occupational History   Not on file  Tobacco Use   Smoking status: Never   Smokeless tobacco: Never  Vaping Use   Vaping Use: Never used  Substance and Sexual Activity   Alcohol use: Yes    Comment: socially   Drug use: No   Sexual activity: Not on file  Other Topics Concern   Not on file  Social History Narrative   Patient is a widow and lives alone with her dog.   Patient has three children.   Patient is retired.   Patient has a college education.   Patient is right-handed.   Caffeine: Patient drinks 1 cup of coffee 2-3 times per week      Social Determinants of Health   Financial Resource Strain: Not on file  Food Insecurity: Not on file  Transportation Needs: Not on  file  Physical Activity: Not on file  Stress: Not on file  Social Connections: Not on file  Intimate Partner Violence: Not on file    FAMILY HISTORY: Family History  Problem Relation Age of Onset   Dementia Father    Cancer Brother    Colon cancer Maternal Grandmother    Other Son        salivary gland "tumor"    ALLERGIES:  is allergic to erythromycin and penicillins.  MEDICATIONS:  Current Outpatient  Medications  Medication Sig Dispense Refill   B Complex Vitamins (VITAMIN B COMPLEX PO) Vitamin B complex     clopidogrel (PLAVIX) 75 MG tablet Take 1 tablet by mouth daily.     estradiol (ESTRACE) 0.1 MG/GM vaginal cream Place 1 Applicatorful vaginally 2 (two) times a week. As needed     famotidine (PEPCID) 40 MG tablet Take 40 mg by mouth 2 (two) times daily.     levothyroxine (LEVOTHROID) 25 MCG tablet Take 1 tablet (25 mcg total) by mouth daily. 90 tablet 3   Multiple Vitamins-Minerals (MULTIVITAMIN PO) Take 1 tablet by mouth daily.     spironolactone (ALDACTONE) 50 MG tablet Take 50 mg by mouth daily.     UNABLE TO FIND Med Name: Nutrafol-oral vitamins for hair loss     UNABLE TO FIND Med Name: collagen peptides powder     No current facility-administered medications for this visit.    REVIEW OF SYSTEMS:   Constitutional: ( - ) fevers, ( - )  chills , ( - ) night sweats Eyes: ( - ) blurriness of vision, ( - ) double vision, ( - ) watery eyes Ears, nose, mouth, throat, and face: ( - ) mucositis, ( - ) sore throat Respiratory: ( - ) cough, ( - ) dyspnea, ( - ) wheezes Cardiovascular: ( - ) palpitation, ( - ) chest discomfort, ( - ) lower extremity swelling Gastrointestinal:  ( - ) nausea, ( - ) heartburn, ( - ) change in bowel habits Skin: ( - ) abnormal skin rashes Lymphatics: ( - ) new lymphadenopathy, ( - ) easy bruising Neurological: ( - ) numbness, ( - ) tingling, ( - ) new weaknesses Behavioral/Psych: ( - ) mood change, ( - ) new changes  All other systems were reviewed with the patient and are negative.  PHYSICAL EXAMINATION:  Vitals:   01/05/22 0925  BP: 134/65  Pulse: 65  Resp: 17  Temp: 98.4 F (36.9 C)  SpO2: 100%   Filed Weights   01/05/22 0925  Weight: 143 lb 6.4 oz (65 kg)    GENERAL: Well-appearing elderly Caucasian female, alert, no distress and comfortable SKIN: skin color, texture, turgor are normal, no rashes or significant lesions EYES: conjunctiva  are pink and non-injected, sclera clear LUNGS: clear to auscultation and percussion with normal breathing effort HEART: regular rate & rhythm and no murmurs and no lower extremity edema Musculoskeletal: no cyanosis of digits and no clubbing  PSYCH: alert & oriented x 3, fluent speech NEURO: no focal motor/sensory deficits  LABORATORY DATA:  I have reviewed the data as listed CBC Latest Ref Rng & Units 01/05/2022 12/18/2021 12/08/2021  WBC 4.0 - 10.5 K/uL 3.7(L) 5.3 7.1  Hemoglobin 12.0 - 15.0 g/dL 13.1 13.7 14.1  Hematocrit 36.0 - 46.0 % 37.8 39.4 41.0  Platelets 150 - 400 K/uL 212 265 233    CMP Latest Ref Rng & Units 12/08/2021 04/23/2021 04/22/2021  Glucose 70 - 99 mg/dL 98 105(H) 149(H)  BUN 8 - 23 mg/dL 17 23 27(H)  Creatinine 0.44 - 1.00 mg/dL 0.85 0.86 0.98  Sodium 135 - 145 mmol/L 138 135 136  Potassium 3.5 - 5.1 mmol/L 4.0 3.9 4.1  Chloride 98 - 111 mmol/L 104 108 102  CO2 22 - 32 mmol/L 27 24 27   Calcium 8.9 - 10.3 mg/dL 9.6 8.1(L) 9.7  Total Protein 6.5 - 8.1 g/dL 7.0 5.1(L) 7.2  Total Bilirubin 0.3 - 1.2 mg/dL 0.4 0.6 0.6  Alkaline Phos 38 - 126 U/L 60 43 56  AST 15 - 41 U/L 23 18 27   ALT 0 - 44 U/L 22 17 22    RADIOGRAPHIC STUDIES: ECHOCARDIOGRAM COMPLETE  Result Date: 12/29/2021    ECHOCARDIOGRAM REPORT   Patient Name:   Linda Macdonald Date of Exam: 12/29/2021 Medical Rec #:  [id number]         Height:       64.0 in Accession #:    [id number]        Weight:       144.8 lb Date of Birth:  [birth year]          BSA:          1.705 m Patient Age:    5 years          BP:           112/55 mmHg Patient Gender: F                 HR:           64 bpm. Exam Location:  Outpatient Procedure: 2D Echo Indications:    Hemochromatosis  History:        Patient has no prior history of Echocardiogram examinations.                 Hypothyroidism                 Chronic kidney disease.  Sonographer:    Arlyss Gandy Referring Phys: [id number] [place]  1. Left ventricular ejection  fraction, by estimation, is 60 to 65%. The left ventricle has normal function. The left ventricle has no regional wall motion abnormalities. Left ventricular diastolic parameters were normal.  2. Right ventricular systolic function is normal. The right ventricular size is normal.  3. The mitral valve is normal in structure. No evidence of mitral valve regurgitation. No evidence of mitral stenosis.  4. The aortic valve has an indeterminant number of cusps. Aortic valve regurgitation is not visualized. No aortic stenosis is present.  5. The inferior vena cava is normal in size with greater than 50% respiratory variability, suggesting right atrial pressure of 3 mmHg. Comparison(s): No prior Echocardiogram. FINDINGS  Left Ventricle: Left ventricular ejection fraction, by estimation, is 60 to 65%. The left ventricle has normal function. The left ventricle has no regional wall motion abnormalities. The left ventricular internal cavity size was normal in size. There is  no left ventricular hypertrophy. Left ventricular diastolic parameters were normal. Right Ventricle: The right ventricular size is normal. Right ventricular systolic function is normal. Left Atrium: Left atrial size was normal in size. Right Atrium: Right atrial size was normal in size. Pericardium: There is no evidence of pericardial effusion. Mitral Valve: The mitral valve is normal in structure. Mild mitral annular calcification. No evidence of mitral valve regurgitation. No evidence of mitral valve stenosis. Tricuspid Valve: The tricuspid valve is normal in structure. Tricuspid valve regurgitation is trivial. No evidence of tricuspid  stenosis. Aortic Valve: The aortic valve has an indeterminant number of cusps. Aortic valve regurgitation is not visualized. No aortic stenosis is present. Pulmonic Valve: The pulmonic valve was not well visualized. Pulmonic valve regurgitation is not visualized. No evidence of pulmonic stenosis. Aorta: The aortic root is  normal in size and structure. Venous: The inferior vena cava is normal in size with greater than 50% respiratory variability, suggesting right atrial pressure of 3 mmHg. IAS/Shunts: No atrial level shunt detected by color flow Doppler.  LEFT VENTRICLE PLAX 2D LVIDd:         4.10 cm   Diastology LVIDs:         2.80 cm   LV e' medial:    11.00 cm/s LV PW:         0.70 cm   LV E/e' medial:  9.6 LV IVS:        0.70 cm   LV e' lateral:   10.40 cm/s LVOT diam:     1.80 cm   LV E/e' lateral: 10.2 LVOT Area:     2.54 cm  RIGHT VENTRICLE             IVC RV Basal diam:  3.00 cm     IVC diam: 1.50 cm RV Mid diam:    2.30 cm RV S prime:     12.10 cm/s TAPSE (M-mode): 2.1 cm LEFT ATRIUM             Index        RIGHT ATRIUM           Index LA diam:        3.80 cm 2.23 cm/m   RA Area:     10.60 cm LA Vol (A2C):   32.4 ml 19.00 ml/m  RA Volume:   20.20 ml  11.84 ml/m LA Vol (A4C):   27.9 ml 16.36 ml/m LA Biplane Vol: 30.8 ml 18.06 ml/m   AORTA Ao Root diam: 2.70 cm MITRAL VALVE MV Area (PHT): 3.99 cm     SHUNTS MV Decel Time: 190 msec     Systemic Diam: 1.80 cm MV E velocity: 106.00 cm/s MV A velocity: 73.70 cm/s MV E/A ratio:  1.44 Kirk Ruths MD Electronically signed by Kirk Ruths MD Signature Date/Time: 12/29/2021/12:16:41 PM    Final     ASSESSMENT & PLAN Linda Macdonald 81 y.o. female with medical history significant for hereditary hemochromatosis presents for a follow up visit.   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 --Baseline labs including hemoglobin A1c, TSH, and echocardiogram showed no evidence of damage to organs from iron overload.  MRI previously confirmed heavy iron deposition in the liver and spleen. -- Continue phlebotomies every 2 weeks with a target ferritin of less than 50 --Labs today show a hemoglobin of 13.1, MCV of 96.7, and ferritin 144 -- Plan to see patient back in 4 weeks time with  interval every 2 weekly phlebotomies.  No orders of the defined types were placed in this encounter.   All questions were answered. The patient knows to call the clinic with any problems, questions or concerns.  A total of more than 30 minutes were spent on this encounter with face-to-face time and non-face-to-face time, including preparing to see the patient, ordering tests and/or medications, counseling the patient and coordination of care as outlined above.   Ledell Peoples, MD Department of Hematology/Oncology [facility]  Phone: [phone] Pager: [phone] Email: [email or web address]  01/05/2022 2:20 PM

## 2022-01-05 NOTE — Progress Notes (Signed)
Linda Macdonald presents today for phlebotomy per MD orders. Phlebotomy procedure started at 0954 and ended at 1001. 500 mls removed Patient observed for 30 minutes after procedure without any incident. Patient tolerated procedure well. IV needle removed intact.  Vitals stable and discharged home from clinic ambulatory. Follow up as scheduled.

## 2022-01-05 NOTE — Patient Instructions (Signed)
[facility] MEDICAL ONCOLOGY  Discharge Instructions: °Thank you for choosing [facility] to provide your oncology and hematology care.  ° °If you have a lab appointment with the Cancer Center, please go directly to the Cancer Center and check in at the registration area. °  ° ° °We strive to give you quality time with your provider. You may need to reschedule your appointment if you arrive late (15 or more minutes).  Arriving late affects you and other patients whose appointments are after yours.  Also, if you miss three or more appointments without notifying the office, you may be dismissed from the clinic at the provider’s discretion.    °  °For prescription refill requests, have your pharmacy contact our office and allow 72 hours for refills to be completed.   ° ° °  °To help prevent nausea and vomiting after your treatment, we encourage you to take your nausea medication as directed. ° °BELOW ARE SYMPTOMS THAT SHOULD BE REPORTED IMMEDIATELY: °*FEVER GREATER THAN 100.4 F (38 °C) OR HIGHER °*CHILLS OR SWEATING °*NAUSEA AND VOMITING THAT IS NOT CONTROLLED WITH YOUR NAUSEA MEDICATION °*UNUSUAL SHORTNESS OF BREATH °*UNUSUAL BRUISING OR BLEEDING °*URINARY PROBLEMS (pain or burning when urinating, or frequent urination) °*BOWEL PROBLEMS (unusual diarrhea, constipation, pain near the anus) °TENDERNESS IN MOUTH AND THROAT WITH OR WITHOUT PRESENCE OF ULCERS (sore throat, sores in mouth, or a toothache) °UNUSUAL RASH, SWELLING OR PAIN  °UNUSUAL VAGINAL DISCHARGE OR ITCHING  ° °Items with * indicate a potential emergency and should be followed up as soon as possible or go to the Emergency Department if any problems should occur. ° °Please show the CHEMOTHERAPY ALERT CARD or IMMUNOTHERAPY ALERT CARD at check-in to the Emergency Department and triage nurse. ° °Should you have questions after your visit or need to cancel or reschedule your appointment, please contact [facility]  MEDICAL ONCOLOGY  Dept: [phone]  and follow the prompts.  Office hours are 8:00 a.m. to 4:30 p.m. Monday - Friday. Please note that voicemails left after 4:00 p.m. may not be returned until the following business day.  We are closed weekends and major holidays. You have access to a nurse at all times for urgent questions. Please call the main number to the clinic Dept: [phone] and follow the prompts. ° ° °For any non-urgent questions, you may also contact your provider using MyChart. We now offer e-Visits for anyone 18 and older to request care online for non-urgent symptoms. For details visit [email or web address]. °  °Also download the MyChart app! Go to the app store, search "MyChart", open the app, select [facility], and log in with your MyChart username and password. ° °Due to Covid, a mask is required upon entering the hospital/clinic. If you do not have a mask, one will be given to you upon arrival. For doctor visits, patients may have 1 support person aged 18 or older with them. For treatment visits, patients cannot have anyone with them due to current Covid guidelines and our immunocompromised population.  ° °

## 2022-01-19 ENCOUNTER — Inpatient Hospital Stay: Payer: Medicare Other

## 2022-01-19 ENCOUNTER — Inpatient Hospital Stay: Payer: Medicare Other | Attending: Physician Assistant

## 2022-01-19 ENCOUNTER — Other Ambulatory Visit: Payer: Self-pay

## 2022-01-19 DIAGNOSIS — Z800 Family history of malignant neoplasm of digestive organs: Secondary | ICD-10-CM | POA: Diagnosis not present

## 2022-01-19 LAB — CBC WITH DIFFERENTIAL (CANCER CENTER ONLY)
Abs Immature Granulocytes: 0.01 10*3/uL (ref 0.00–0.07)
Basophils Absolute: 0 10*3/uL (ref 0.0–0.1)
Basophils Relative: 1 %
Eosinophils Absolute: 0.2 10*3/uL (ref 0.0–0.5)
Eosinophils Relative: 5 %
HCT: 38.5 % (ref 36.0–46.0)
Hemoglobin: 13.7 g/dL (ref 12.0–15.0)
Immature Granulocytes: 0 %
Lymphocytes Relative: 35 %
Lymphs Abs: 1.2 10*3/uL (ref 0.7–4.0)
MCH: 33.8 pg (ref 26.0–34.0)
MCHC: 35.6 g/dL (ref 30.0–36.0)
MCV: 95.1 fL (ref 80.0–100.0)
Monocytes Absolute: 0.3 10*3/uL (ref 0.1–1.0)
Monocytes Relative: 10 %
Neutro Abs: 1.7 10*3/uL (ref 1.7–7.7)
Neutrophils Relative %: 49 %
Platelet Count: 243 10*3/uL (ref 150–400)
RBC: 4.05 MIL/uL (ref 3.87–5.11)
RDW: 12.1 % (ref 11.5–15.5)
WBC Count: 3.4 10*3/uL — ABNORMAL LOW (ref 4.0–10.5)
nRBC: 0 % (ref 0.0–0.2)

## 2022-01-19 LAB — FERRITIN: Ferritin: 102 ng/mL (ref 11–307)

## 2022-01-19 NOTE — Progress Notes (Signed)
Therapeutic phlebotomy started today per MD order at [phone] and ended at 1000 due to patient feeling faint and decrease in response to verbal stimuli. 148g were removed utilizing a 16G phlebotomy kit in LAC. 22G IV started in RAC and 1L of fluids given. Dr. Lorenso Courier called to chairside for evaluation. Patient ambulated in the hallway at 11:20 after 1L of fluids finished. No lightheadedness or dizziness, stable for discharge at 1125, ambulatory to lobby.  Vitals at 1004 BP: 85/49  HR: 61 O2: 98% on 2L RR: 25  Vitals at 1012 BP: 103/48 HR: 60 O2: 98% on 2L RR: 20  Vitals at 11:20  BP: 138/63 HR: 66 O2: 100% on RA RR:18

## 2022-01-19 NOTE — Patient Instructions (Signed)
Therapeutic Phlebotomy °Therapeutic phlebotomy is the planned removal of blood from a person's body for the purpose of treating a medical condition. The procedure is lot like donating blood. Usually, about a pint (470 mL, or 0.47 L) of blood is removed. The average adult has 9-12 pints (4.3-5.7 L) of blood in his or her body. °Therapeutic phlebotomy may be used to treat the following medical conditions: °Hemochromatosis. This is a condition in which the blood contains too much iron. °Polycythemia vera. This is a condition in which the blood contains too many red blood cells. °Porphyria cutanea tarda. This is a disease in which an important part of hemoglobin is not made properly. It results in the buildup of abnormal amounts of porphyrins in the body. °Sickle cell disease. This is a condition in which the red blood cells form an abnormal crescent shape rather than a round shape. °Tell a health care provider about: °Any allergies you have. °All medicines you are taking, including vitamins, herbs, eye drops, creams, and over-the-counter medicines. °Any bleeding problems you have. °Any surgeries you have had. °Any medical conditions you have. °Whether you are pregnant or may be pregnant. °What are the risks? °Generally, this is a safe procedure. However, problems may occur, including: °Nausea or light-headedness. °Low blood pressure (hypotension). °Soreness, bleeding, swelling, or bruising at the needle insertion site. °Infection. °What happens before the procedure? °Ask your health care provider about: °Changing or stopping your regular medicines. This is especially important if you are taking diabetes medicines or blood thinners. °Taking medicines such as aspirin and ibuprofen. These medicines can thin your blood. Do not take these medicines unless your health care provider tells you to take them. °Taking over-the-counter medicines, vitamins, herbs, and supplements. °Wear clothing with sleeves that can be raised  above the elbow. °You may have a blood sample taken. °Your blood pressure, pulse rate, and breathing rate will be measured. °What happens during the procedure? ° °You may be given a medicine to numb the area (local anesthetic). °A tourniquet will be placed on your arm. °A needle will be put into one of your veins. °Tubing and a collection bag will be attached to the needle. °Blood will flow through the needle and tubing into the collection bag. °The collection bag will be placed lower than your arm so gravity can help the blood flow into the bag. °You may be asked to open and close your hand slowly and continually during the entire collection. °After the specified amount of blood has been removed from your body, the collection bag and tubing will be clamped. °The needle will be removed from your vein. °Pressure will be held on the needle site to stop the bleeding. °A bandage (dressing) will be placed over the needle insertion site. °The procedure may vary among health care providers and hospitals. °What happens after the procedure? °Your blood pressure, pulse rate, and breathing rate will be measured after the procedure. °You will be encouraged to drink fluids. °You will be encouraged to eat a snack to prevent a low blood sugar level. °Your recovery will be assessed and monitored. °Return to your normal activities as told by your health care provider. °Summary °Therapeutic phlebotomy is the planned removal of blood from a person's body for the purpose of treating a medical condition. °Therapeutic phlebotomy may be used to treat hemochromatosis, polycythemia vera, porphyria cutanea tarda, or sickle cell disease. °In the procedure, a needle is inserted and about a pint (470 mL, or 0.47 L) of blood is   removed. The average adult has 9-12 pints (4.3-5.7 L) of blood in the body. °This is generally a safe procedure, but it can sometimes cause problems such as nausea, light-headedness, or low blood pressure  (hypotension). °This information is not intended to replace advice given to you by your health care provider. Make sure you discuss any questions you have with your health care provider. °Document Revised: 05/24/2021 Document Reviewed: 05/24/2021 °Elsevier Patient Education © 2022 [employer] ° °

## 2022-01-19 NOTE — Addendum Note (Signed)
Addended by: Jory Sims B on: 01/19/2022 11:41 AM   Modules accepted: Orders

## 2022-02-02 ENCOUNTER — Inpatient Hospital Stay: Payer: Medicare Other

## 2022-02-02 ENCOUNTER — Inpatient Hospital Stay: Payer: Medicare Other | Admitting: Physician Assistant

## 2022-02-02 ENCOUNTER — Other Ambulatory Visit: Payer: Self-pay

## 2022-02-02 LAB — CBC WITH DIFFERENTIAL (CANCER CENTER ONLY)
Abs Immature Granulocytes: 0 10*3/uL (ref 0.00–0.07)
Basophils Absolute: 0 10*3/uL (ref 0.0–0.1)
Basophils Relative: 1 %
Eosinophils Absolute: 0.1 10*3/uL (ref 0.0–0.5)
Eosinophils Relative: 3 %
HCT: 39.8 % (ref 36.0–46.0)
Hemoglobin: 14.1 g/dL (ref 12.0–15.0)
Immature Granulocytes: 0 %
Lymphocytes Relative: 41 %
Lymphs Abs: 1.4 10*3/uL (ref 0.7–4.0)
MCH: 33.4 pg (ref 26.0–34.0)
MCHC: 35.4 g/dL (ref 30.0–36.0)
MCV: 94.3 fL (ref 80.0–100.0)
Monocytes Absolute: 0.4 10*3/uL (ref 0.1–1.0)
Monocytes Relative: 10 %
Neutro Abs: 1.5 10*3/uL — ABNORMAL LOW (ref 1.7–7.7)
Neutrophils Relative %: 45 %
Platelet Count: 222 10*3/uL (ref 150–400)
RBC: 4.22 MIL/uL (ref 3.87–5.11)
RDW: 11.6 % (ref 11.5–15.5)
WBC Count: 3.4 10*3/uL — ABNORMAL LOW (ref 4.0–10.5)
nRBC: 0 % (ref 0.0–0.2)

## 2022-02-02 LAB — FERRITIN: Ferritin: 88 ng/mL (ref 11–307)

## 2022-02-02 NOTE — Patient Instructions (Signed)

## 2022-02-02 NOTE — Progress Notes (Signed)
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 Testing showed ferritin 173 and genetic testing consistent with  Homozygous C282Y 12/22/2021: Started phlebotomy q 2 weeks.   Interval History:  Linda Macdonald 81 y.o. female with medical history significant for hereditary hemochromatosis presents for a follow up visit. The patient's last visit was on 01/05/2022 with Dr.Dorsey. In the interim since the last visit she has continued therapeutic phlebotomy.  On exam today Linda Macdonald reports she continues to tolerate the phlebotomies without any significant limitations.  She experienced some dizziness after the last phlebotomy which improved with IV hydration.  She has increased her intake of oral fluids to prevent future episodes of dizziness.  She continues to stay active and complete all her daily activities on her own.  She denies any appetite changes or weight loss.  She denies nausea, vomiting or abdominal pain.  Her bowel habits are regular without any diarrhea or constipation.  She denies easy bruising or signs of active bleeding.  She denies fevers, chills, night sweats, shortness of breath, chest pain or cough.  She has no other complaints.  Rest of the 10 point ROS is below.   MEDICAL HISTORY:  Past Medical History:  Diagnosis Date   Arthritis    Chronic kidney disease    Depression    Hair loss    d/t propranolol   hypothyroidism    Leukopenia 08/12/2012    SURGICAL HISTORY: Past Surgical History:  Procedure  Laterality Date   CYSTOSCOPY/URETEROSCOPY/HOLMIUM LASER/STENT PLACEMENT Right 04/23/2021   Procedure: CYSTOSCOPY/URETEROSCOPY/HOLMIUM LASER/STENT PLACEMENT;  Surgeon: Ceasar Mons, MD;  Location: [facility];  Service: Urology;  Laterality: Right;   EXTRACORPOREAL SHOCK WAVE LITHOTRIPSY Left 04/25/2017   Procedure: LEFT EXTRACORPOREAL SHOCK WAVE LITHOTRIPSY (ESWL);  Surgeon: Raynelle Bring, MD;  Location: [facility];  Service: Urology;  Laterality: Left;   KNEE ARTHROSCOPY Left 2oyrs ago   L knee x2/ R knee x1   LITHOTRIPSY  2012   difficulty voiding after procedure, d/c home with foley per pt   TUBAL LIGATION     WISDOM TOOTH EXTRACTION     teenager    SOCIAL HISTORY: Social History   Socioeconomic History   Marital status: Widowed    Spouse name: Not on file   Number of children: 3   Years of education: College   Highest education level: Not on file  Occupational History   Not on file  Tobacco Use   Smoking status: Never   Smokeless tobacco: Never  Vaping Use   Vaping Use: Never used  Substance and Sexual Activity   Alcohol use: Yes    Comment: socially   Drug use: No   Sexual activity: Not on file  Other Topics Concern   Not on file  Social History Narrative   Patient is a widow and lives alone with her dog.   Patient has three children.   Patient is retired.   Patient has a college education.   Patient is right-handed.   Caffeine: Patient drinks 1 cup of coffee 2-3 times per week      Social Determinants of Health   Financial  Resource Strain: Not on file  Food Insecurity: Not on file  Transportation Needs: Not on file  Physical Activity: Not on file  Stress: Not on file  Social Connections: Not on file  Intimate Partner Violence: Not on file    FAMILY HISTORY: Family History  Problem Relation Age of Onset   Dementia Father    Cancer Brother    Colon cancer Maternal Grandmother    Other Son        salivary gland "tumor"    ALLERGIES:  is  allergic to erythromycin and penicillins.  MEDICATIONS:  Current Outpatient Medications  Medication Sig Dispense Refill   clopidogrel (PLAVIX) 75 MG tablet Take 1 tablet by mouth daily.     estradiol (ESTRACE) 0.1 MG/GM vaginal cream Place 1 Applicatorful vaginally 2 (two) times a week. As needed     famotidine (PEPCID) 40 MG tablet Take 40 mg by mouth 2 (two) times daily.     levothyroxine (LEVOTHROID) 25 MCG tablet Take 1 tablet (25 mcg total) by mouth daily. 90 tablet 3   spironolactone (ALDACTONE) 50 MG tablet Take 50 mg by mouth daily.     UNABLE TO FIND Med Name: collagen peptides powder     B Complex Vitamins (VITAMIN B COMPLEX PO) Vitamin B complex (Patient not taking: Reported on 02/02/2022)     Multiple Vitamins-Minerals (MULTIVITAMIN PO) Take 1 tablet by mouth daily. (Patient not taking: Reported on 02/02/2022)     UNABLE TO FIND Med Name: Nutrafol-oral vitamins for hair loss (Patient not taking: Reported on 02/02/2022)     No current facility-administered medications for this visit.    REVIEW OF SYSTEMS:   Constitutional: ( - ) fevers, ( - )  chills , ( - ) night sweats Eyes: ( - ) blurriness of vision, ( - ) double vision, ( - ) watery eyes Ears, nose, mouth, throat, and face: ( - ) mucositis, ( - ) sore throat Respiratory: ( - ) cough, ( - ) dyspnea, ( - ) wheezes Cardiovascular: ( - ) palpitation, ( - ) chest discomfort, ( - ) lower extremity swelling Gastrointestinal:  ( - ) nausea, ( - ) heartburn, ( - ) change in bowel habits Skin: ( - ) abnormal skin rashes Lymphatics: ( - ) new lymphadenopathy, ( - ) easy bruising Neurological: ( - ) numbness, ( - ) tingling, ( - ) new weaknesses Behavioral/Psych: ( - ) mood change, ( - ) new changes  All other systems were reviewed with the patient and are negative.  PHYSICAL EXAMINATION:  Vitals:   02/02/22 0831  BP: 128/66  Pulse: 72  Resp: 17  Temp: 97.8 F (36.6 C)  SpO2: 100%   Filed Weights   02/02/22 0831  Weight:  143 lb 9.6 oz (65.1 kg)    GENERAL: Well-appearing elderly Caucasian female, alert, no distress and comfortable SKIN: skin color, texture, turgor are normal, no rashes or significant lesions EYES: conjunctiva are pink and non-injected, sclera clear LUNGS: clear to auscultation and percussion with normal breathing effort HEART: regular rate & rhythm and no murmurs and no lower extremity edema Musculoskeletal: no cyanosis of digits and no clubbing  PSYCH: alert & oriented x 3, fluent speech NEURO: no focal motor/sensory deficits  LABORATORY DATA:  I have reviewed the data as listed CBC Latest Ref Rng & Units 02/02/2022 01/19/2022 01/05/2022  WBC 4.0 - 10.5 K/uL 3.4(L) 3.4(L) 3.7(L)  Hemoglobin 12.0 - 15.0 g/dL 14.1 13.7 13.1  Hematocrit 36.0 - 46.0 %  39.8 38.5 37.8  Platelets 150 - 400 K/uL 222 243 212    CMP Latest Ref Rng & Units 12/08/2021 04/23/2021 04/22/2021  Glucose 70 - 99 mg/dL 98 105(H) 149(H)  BUN 8 - 23 mg/dL 17 23 27(H)  Creatinine 0.44 - 1.00 mg/dL 0.85 0.86 0.98  Sodium 135 - 145 mmol/L 138 135 136  Potassium 3.5 - 5.1 mmol/L 4.0 3.9 4.1  Chloride 98 - 111 mmol/L 104 108 102  CO2 22 - 32 mmol/L 27 24 27   Calcium 8.9 - 10.3 mg/dL 9.6 8.1(L) 9.7  Total Protein 6.5 - 8.1 g/dL 7.0 5.1(L) 7.2  Total Bilirubin 0.3 - 1.2 mg/dL 0.4 0.6 0.6  Alkaline Phos 38 - 126 U/L 60 43 56  AST 15 - 41 U/L 23 18 27   ALT 0 - 44 U/L 22 17 22    RADIOGRAPHIC STUDIES: No results found.  ASSESSMENT & PLAN Linda Macdonald 81 y.o. female with medical history significant for hereditary hemochromatosis presents for a follow up visit.   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 --Baseline labs including hemoglobin A1c, TSH, and echocardiogram showed no evidence of damage to organs from iron overload.  MRI previously confirmed heavy iron deposition in the liver and spleen. -- Continue phlebotomies every 2  weeks with a target ferritin of less than 50 -- Labs today show a hemoglobin of 14.1, MCV of 94.3, and ferritin 88.  -- Plan to see patient back in 4 weeks time with interval every 2 weekly phlebotomies.  No orders of the defined types were placed in this encounter.   All questions were answered. The patient knows to call the clinic with any problems, questions or concerns.  I have spent a total of 25 minutes minutes of face-to-face and non-face-to-face time, preparing to see the patient, performing a medically appropriate examination, counseling and educating the patient, ordering tests/procedures, documenting clinical information in the electronic health record, and care coordination.    Dede Query PA-C Dept of Hematology and [place] at [facility] Phone: [phone]

## 2022-02-02 NOTE — Progress Notes (Signed)
Linda Macdonald presents today for phlebotomy per MD orders. Phlebotomy procedure started at 0914 and ended at 0924. 511 grams removed via 16 G IV needle to LAC. IV needle removed intact.  Patient observed for 30 minutes after procedure without any incident. Patient tolerated procedure well. VS retaken and remained stable.

## 2022-02-05 ENCOUNTER — Telehealth: Payer: Self-pay | Admitting: Physician Assistant

## 2022-02-05 NOTE — Telephone Encounter (Signed)
Scheduled per 2/24 los, message has been left with pt

## 2022-02-16 ENCOUNTER — Inpatient Hospital Stay: Payer: Medicare Other

## 2022-02-16 ENCOUNTER — Other Ambulatory Visit: Payer: Self-pay

## 2022-02-16 ENCOUNTER — Inpatient Hospital Stay: Payer: Medicare Other | Attending: Physician Assistant

## 2022-02-16 DIAGNOSIS — N189 Chronic kidney disease, unspecified: Secondary | ICD-10-CM | POA: Insufficient documentation

## 2022-02-16 DIAGNOSIS — Z800 Family history of malignant neoplasm of digestive organs: Secondary | ICD-10-CM | POA: Insufficient documentation

## 2022-02-16 LAB — CBC WITH DIFFERENTIAL (CANCER CENTER ONLY)
Abs Immature Granulocytes: 0.01 10*3/uL (ref 0.00–0.07)
Basophils Absolute: 0 10*3/uL (ref 0.0–0.1)
Basophils Relative: 1 %
Eosinophils Absolute: 0.1 10*3/uL (ref 0.0–0.5)
Eosinophils Relative: 2 %
HCT: 38.1 % (ref 36.0–46.0)
Hemoglobin: 13.7 g/dL (ref 12.0–15.0)
Immature Granulocytes: 0 %
Lymphocytes Relative: 27 %
Lymphs Abs: 1.1 10*3/uL (ref 0.7–4.0)
MCH: 34 pg (ref 26.0–34.0)
MCHC: 36 g/dL (ref 30.0–36.0)
MCV: 94.5 fL (ref 80.0–100.0)
Monocytes Absolute: 0.3 10*3/uL (ref 0.1–1.0)
Monocytes Relative: 8 %
Neutro Abs: 2.6 10*3/uL (ref 1.7–7.7)
Neutrophils Relative %: 62 %
Platelet Count: 238 10*3/uL (ref 150–400)
RBC: 4.03 MIL/uL (ref 3.87–5.11)
RDW: 11.7 % (ref 11.5–15.5)
WBC Count: 4.1 10*3/uL (ref 4.0–10.5)
nRBC: 0 % (ref 0.0–0.2)

## 2022-02-16 LAB — FERRITIN: Ferritin: 70 ng/mL (ref 11–307)

## 2022-02-16 NOTE — Patient Instructions (Signed)

## 2022-02-16 NOTE — Progress Notes (Signed)
Savreen Gebhardt Dutch presents today for phlebotomy per MD orders.  ?16 G phlebotomy set used in Left AC. ?Phlebotomy procedure started at 1017 and ended at 87. ?526 grams removed. ?Patient observed for 30 minutes after procedure without any incident. ?Patient tolerated procedure well. ?IV needle removed intact. ? ? ?

## 2022-02-28 ENCOUNTER — Inpatient Hospital Stay: Payer: Medicare Other | Admitting: Physician Assistant

## 2022-02-28 ENCOUNTER — Inpatient Hospital Stay: Payer: Medicare Other

## 2022-02-28 ENCOUNTER — Other Ambulatory Visit: Payer: Medicare Other

## 2022-02-28 ENCOUNTER — Other Ambulatory Visit: Payer: Self-pay

## 2022-02-28 LAB — CBC WITH DIFFERENTIAL (CANCER CENTER ONLY)
Abs Immature Granulocytes: 0.01 10*3/uL (ref 0.00–0.07)
Basophils Absolute: 0 10*3/uL (ref 0.0–0.1)
Basophils Relative: 1 %
Eosinophils Absolute: 0.1 10*3/uL (ref 0.0–0.5)
Eosinophils Relative: 2 %
HCT: 35.9 % — ABNORMAL LOW (ref 36.0–46.0)
Hemoglobin: 12.9 g/dL (ref 12.0–15.0)
Immature Granulocytes: 0 %
Lymphocytes Relative: 26 %
Lymphs Abs: 1.1 10*3/uL (ref 0.7–4.0)
MCH: 33.5 pg (ref 26.0–34.0)
MCHC: 35.9 g/dL (ref 30.0–36.0)
MCV: 93.2 fL (ref 80.0–100.0)
Monocytes Absolute: 0.4 10*3/uL (ref 0.1–1.0)
Monocytes Relative: 10 %
Neutro Abs: 2.7 10*3/uL (ref 1.7–7.7)
Neutrophils Relative %: 61 %
Platelet Count: 252 10*3/uL (ref 150–400)
RBC: 3.85 MIL/uL — ABNORMAL LOW (ref 3.87–5.11)
RDW: 11.7 % (ref 11.5–15.5)
WBC Count: 4.3 10*3/uL (ref 4.0–10.5)
nRBC: 0 % (ref 0.0–0.2)

## 2022-02-28 LAB — FERRITIN: Ferritin: 54 ng/mL (ref 11–307)

## 2022-02-28 NOTE — Progress Notes (Signed)
?[facility] ?Telephone:(336) [phone]   Fax:(336) [phone] ? ?PROGRESS NOTE ? ?Patient Care Team: ?Dois Davenportichter, Karen L, MD as PCP - General (Family Medicine) ?Charna ElizabethMann, Jyothi, MD as Consulting Physician (Gastroenterology) ? 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 Testing showed ferritin 173 and genetic testing consistent with  Homozygous C282Y ?12/22/2021: Started phlebotomy q 2 weeks.  ? ?Interval History:  ?Linda Macdonald 81 y.o. female with medical history significant for hereditary hemochromatosis presents for a follow up visit. Patient was last seen on 02/02/2022. In the interim since the last visit she has continued therapeutic phlebotomy. ? ?On exam today Linda Macdonald reports she continues to tolerate the phlebotomies without any significant limitations. She is staying hydrated to prevent post procedural dizziness. Her energy levels and appetite are stable. She denies any nausea, vomiting or abdominal pain.  Her bowel habits are regular without any diarrhea or constipation.  She denies easy bruising or signs of active bleeding.  She continues to have styes affecting both eyes. She is applying warm compresses with improvement of symptoms. She denies fevers, chills, night sweats, shortness of breath, chest pain or cough.  She has no other complaints.  Rest of the 10 point ROS is below. ? ? ?MEDICAL HISTORY:  ?Past Medical History:  ?Diagnosis Date  ? Arthritis   ? Chronic kidney disease   ? Depression   ? Hair loss   ? d/t propranolol  ? hypothyroidism   ? Leukopenia 08/12/2012  ? ? ?SURGICAL HISTORY: ?Past Surgical History:  ?Procedure Laterality Date  ? CYSTOSCOPY/URETEROSCOPY/HOLMIUM LASER/STENT PLACEMENT Right 04/23/2021  ? Procedure:  CYSTOSCOPY/URETEROSCOPY/HOLMIUM LASER/STENT PLACEMENT;  Surgeon: Rene PaciWinter, Christopher Aaron, MD;  Location: [facility];  Service: Urology;  Laterality: Right;  ? EXTRACORPOREAL SHOCK WAVE LITHOTRIPSY Left 04/25/2017  ? Procedure: LEFT EXTRACORPOREAL SHOCK WAVE LITHOTRIPSY (ESWL);  Surgeon: Heloise PurpuraBorden, Lester, MD;  Location: [facility];  Service: Urology;  Laterality: Left;  ? KNEE ARTHROSCOPY Left 2oyrs ago  ? L knee x2/ R knee x1  ? LITHOTRIPSY  2012  ? difficulty voiding after procedure, d/c home with foley per pt  ? TUBAL LIGATION    ? WISDOM TOOTH EXTRACTION    ? teenager  ? ? ?SOCIAL HISTORY: ?Social History  ? ?Socioeconomic History  ? Marital status: Widowed  ?  Spouse name: Not on file  ? Number of children: 3  ? Years of education: College  ? Highest education level: Not on file  ?Occupational History  ? Not on file  ?Tobacco Use  ? Smoking status: Never  ? Smokeless tobacco: Never  ?Vaping Use  ? Vaping Use: Never used  ?Substance and Sexual Activity  ? Alcohol use: Yes  ?  Comment: socially  ? Drug use: No  ? Sexual activity: Not on file  ?Other Topics Concern  ? Not on file  ?Social History Narrative  ? Patient is a widow and lives alone with her dog.  ? Patient has three children.  ? Patient is retired.  ? Patient has a college education.  ? Patient is right-handed.  ? Caffeine: Patient drinks 1 cup of coffee 2-3 times per week  ?   ? ?Social Determinants of Health  ? ?Financial Resource Strain: Not on file  ?Food Insecurity: Not on file  ?Transportation Needs: Not on file  ?Physical Activity: Not on file  ?  Stress: Not on file  ?Social Connections: Not on file  ?Intimate Partner Violence: Not on file  ? ? ?FAMILY HISTORY: ?Family History  ?Problem Relation Age of Onset  ? Dementia Father   ? Cancer Brother   ? Colon cancer Maternal Grandmother   ? Other Son   ?     salivary gland "tumor"  ? ? ?ALLERGIES:  is allergic to erythromycin and penicillins. ? ?MEDICATIONS:  ?Current Outpatient Medications  ?Medication Sig  Dispense Refill  ? clopidogrel (PLAVIX) 75 MG tablet Take 1 tablet by mouth daily.    ? estradiol (ESTRACE) 0.1 MG/GM vaginal cream Place 1 Applicatorful vaginally 2 (two) times a week. As needed    ? famotidine (PEPCID) 40 MG tablet Take 40 mg by mouth 2 (two) times daily.    ? levothyroxine (LEVOTHROID) 25 MCG tablet Take 1 tablet (25 mcg total) by mouth daily. 90 tablet 3  ? spironolactone (ALDACTONE) 50 MG tablet Take 50 mg by mouth daily.    ? UNABLE TO FIND Med Name: collagen peptides powder    ? B Complex Vitamins (VITAMIN B COMPLEX PO) Vitamin B complex (Patient not taking: Reported on 02/02/2022)    ? Multiple Vitamins-Minerals (MULTIVITAMIN PO) Take 1 tablet by mouth daily. (Patient not taking: Reported on 02/02/2022)    ? UNABLE TO FIND Med Name: Nutrafol-oral vitamins for hair loss (Patient not taking: Reported on 02/02/2022)    ? ?No current facility-administered medications for this visit.  ? ? ?REVIEW OF SYSTEMS:   ?Constitutional: ( - ) fevers, ( - )  chills , ( - ) night sweats ?Eyes: ( - ) blurriness of vision, ( - ) double vision, ( - ) watery eyes ?Ears, nose, mouth, throat, and face: ( - ) mucositis, ( - ) sore throat ?Respiratory: ( - ) cough, ( - ) dyspnea, ( - ) wheezes ?Cardiovascular: ( - ) palpitation, ( - ) chest discomfort, ( - ) lower extremity swelling ?Gastrointestinal:  ( - ) nausea, ( - ) heartburn, ( - ) change in bowel habits ?Skin: ( - ) abnormal skin rashes ?Lymphatics: ( - ) new lymphadenopathy, ( - ) easy bruising ?Neurological: ( - ) numbness, ( - ) tingling, ( - ) new weaknesses ?Behavioral/Psych: ( - ) mood change, ( - ) new changes  ?All other systems were reviewed with the patient and are negative. ? ?PHYSICAL EXAMINATION: ? ?Vitals:  ? 02/28/22 1038  ?BP: (!) 110/59  ?Pulse: 63  ?Resp: 16  ?Temp: (!) 97.5 ?F (36.4 ?C)  ?SpO2: 100%  ? ?Filed Weights  ? 02/28/22 1038  ?Weight: 140 lb 3.2 oz (63.6 kg)  ? ? ?GENERAL: Well-appearing elderly Caucasian female, alert, no distress  and comfortable ?SKIN: skin color, texture, turgor are normal, no rashes or significant lesions ?EYES: conjunctiva are pink and non-injected, sclera clear. Stye affecting right lower lid with erythema.  ?LUNGS: clear to auscultation and percussion with normal breathing effort ?HEART: regular rate & rhythm and no murmurs and no lower extremity edema ?Musculoskeletal: no cyanosis of digits and no clubbing  ?PSYCH: alert & oriented x 3, fluent speech ?NEURO: no focal motor/sensory deficits ? ?LABORATORY DATA:  ?I have reviewed the data as listed ? ?  Latest Ref Rng & Units 02/28/2022  ? 10:19 AM 02/16/2022  ?  8:40 AM 02/02/2022  ?  8:12 AM  ?CBC  ?WBC 4.0 - 10.5 K/uL 4.3   4.1   3.4    ?Hemoglobin 12.0 - 15.0  g/dL 23.5   36.1   44.3    ?Hematocrit 36.0 - 46.0 % 35.9   38.1   39.8    ?Platelets 150 - 400 K/uL 252   238   222    ? ? ? ?  Latest Ref Rng & Units 12/08/2021  ?  2:16 PM 04/23/2021  ?  2:23 AM 04/22/2021  ?  1:55 PM  ?CMP  ?Glucose 70 - 99 mg/dL 98   154   008    ?BUN 8 - 23 mg/dL 17   23   27     ?Creatinine 0.44 - 1.00 mg/dL   6.76   1.95    ?Sodium 135 - 145 mmol/L 138   135   136    ?Potassium 3.5 - 5.1 mmol/L 4.0   3.9   4.1    ?Chloride 98 - 111 mmol/L 104   108   102    ?CO2 22 - 32 mmol/L 27   24   27     ?Calcium 8.9 - 10.3 mg/dL 9.6   8.1   9.7    ?Total Protein 6.5 - 8.1 g/dL 7.0   5.1   7.2    ?Total Bilirubin 0.3 - 1.2 mg/dL 0.4   0.6   0.6    ?Alkaline Phos 38 - 126 U/L 60   43   56    ?AST 15 - 41 U/L 23   18   27     ?ALT 0 - 44 U/L 22   17   22     ? ?RADIOGRAPHIC STUDIES: ?No results found. ? ?ASSESSMENT & PLAN ?Jamesyn Moorefield Richins 81 y.o. female with medical history significant for hereditary hemochromatosis presents for a follow up visit.  ? 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 ?--Baseline labs including hemoglobin A1c, TSH, and echocardiogram showed no evidence of damage to organs from iron  overload.  MRI previously confirmed heavy iron deposition in the liver and spleen. ?-- Goal with phlebotomies is to reach target ferritin of less than 50 ?-- Labs today show a hemoglobin of 12.9, MCV of 93.2, and ferritin

## 2022-02-28 NOTE — Progress Notes (Signed)
Linda Macdonald presents today for phlebotomy per MD orders. ?Phlebotomy procedure started at 1140 and ended at 1151. ?506 grams removed via 16 G IV needle to RAC. IV needle removed intact.  ?Patient observed for 30 minutes after procedure without any incident. Pt provided with food and drink. ?Patient tolerated procedure well. VSS throughout visit.  ?

## 2022-03-16 ENCOUNTER — Other Ambulatory Visit: Payer: Self-pay

## 2022-03-16 ENCOUNTER — Inpatient Hospital Stay: Payer: Medicare Other | Attending: Physician Assistant

## 2022-03-16 ENCOUNTER — Encounter: Payer: Self-pay | Admitting: Physician Assistant

## 2022-03-16 DIAGNOSIS — N189 Chronic kidney disease, unspecified: Secondary | ICD-10-CM | POA: Diagnosis not present

## 2022-03-16 DIAGNOSIS — Z800 Family history of malignant neoplasm of digestive organs: Secondary | ICD-10-CM | POA: Insufficient documentation

## 2022-03-16 LAB — CBC WITH DIFFERENTIAL (CANCER CENTER ONLY)
Abs Immature Granulocytes: 0.01 10*3/uL (ref 0.00–0.07)
Basophils Absolute: 0 10*3/uL (ref 0.0–0.1)
Basophils Relative: 1 %
Eosinophils Absolute: 0.1 10*3/uL (ref 0.0–0.5)
Eosinophils Relative: 3 %
HCT: 37.2 % (ref 36.0–46.0)
Hemoglobin: 12.8 g/dL (ref 12.0–15.0)
Immature Granulocytes: 0 %
Lymphocytes Relative: 40 %
Lymphs Abs: 1.3 10*3/uL (ref 0.7–4.0)
MCH: 32.6 pg (ref 26.0–34.0)
MCHC: 34.4 g/dL (ref 30.0–36.0)
MCV: 94.7 fL (ref 80.0–100.0)
Monocytes Absolute: 0.3 10*3/uL (ref 0.1–1.0)
Monocytes Relative: 11 %
Neutro Abs: 1.4 10*3/uL — ABNORMAL LOW (ref 1.7–7.7)
Neutrophils Relative %: 45 %
Platelet Count: 269 10*3/uL (ref 150–400)
RBC: 3.93 MIL/uL (ref 3.87–5.11)
RDW: 11.7 % (ref 11.5–15.5)
WBC Count: 3.2 10*3/uL — ABNORMAL LOW (ref 4.0–10.5)
nRBC: 0 % (ref 0.0–0.2)

## 2022-03-16 LAB — FERRITIN: Ferritin: 27 ng/mL (ref 11–307)

## 2022-03-30 ENCOUNTER — Inpatient Hospital Stay: Payer: Medicare Other

## 2022-03-30 ENCOUNTER — Other Ambulatory Visit: Payer: Self-pay

## 2022-03-30 ENCOUNTER — Inpatient Hospital Stay: Payer: Medicare Other | Admitting: Hematology and Oncology

## 2022-03-30 ENCOUNTER — Ambulatory Visit: Payer: Medicare Other | Admitting: Physician Assistant

## 2022-03-30 LAB — CBC WITH DIFFERENTIAL (CANCER CENTER ONLY)
Abs Immature Granulocytes: 0.01 10*3/uL (ref 0.00–0.07)
Basophils Absolute: 0 10*3/uL (ref 0.0–0.1)
Basophils Relative: 1 %
Eosinophils Absolute: 0.1 10*3/uL (ref 0.0–0.5)
Eosinophils Relative: 4 %
HCT: 40.1 % (ref 36.0–46.0)
Hemoglobin: 13.5 g/dL (ref 12.0–15.0)
Immature Granulocytes: 0 %
Lymphocytes Relative: 31 %
Lymphs Abs: 1 10*3/uL (ref 0.7–4.0)
MCH: 31.8 pg (ref 26.0–34.0)
MCHC: 33.7 g/dL (ref 30.0–36.0)
MCV: 94.4 fL (ref 80.0–100.0)
Monocytes Absolute: 0.3 10*3/uL (ref 0.1–1.0)
Monocytes Relative: 10 %
Neutro Abs: 1.8 10*3/uL (ref 1.7–7.7)
Neutrophils Relative %: 54 %
Platelet Count: 229 10*3/uL (ref 150–400)
RBC: 4.25 MIL/uL (ref 3.87–5.11)
RDW: 11.9 % (ref 11.5–15.5)
WBC Count: 3.3 10*3/uL — ABNORMAL LOW (ref 4.0–10.5)
nRBC: 0 % (ref 0.0–0.2)

## 2022-03-30 LAB — FERRITIN: Ferritin: 20 ng/mL (ref 11–307)

## 2022-03-30 NOTE — Progress Notes (Signed)
?[name] ?[phone]   [phone] ? ?PROGRESS NOTE ? ?Patient Care Team: ?Dois Davenport, MD as PCP - General (Family Medicine) ?[name], MD as Consulting Physician (Gastroenterology) ? 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 Testing showed ferritin 173 and genetic testing consistent with  Homozygous C282Y ?12/22/2021: Started phlebotomy q 2 weeks.  ? ?Interval History:  ?Linda Macdonald 81 y.o. female with medical history significant for hereditary hemochromatosis presents for a follow up visit. Patient was last seen on 02/28/2022. In the interim since the last visit she has continued therapeutic phlebotomy q 2 weeks. ? ?On exam today Linda Macdonald reports she has been well in the interim since her last visit.  She tolerated her subsequent iron phlebotomies well without any difficulty.  She did have a single episode early in her phlebotomy course where she had a near syncopal episode but subsequently has been rehydrating and has not had any further difficulty.  She notes that she does have more fatigue than usual and has been taking about 1 nap per day.  Overall she is well and has no other major changes in her health in the interim since her last visit.  She denies fevers, chills, night sweats, shortness of breath, chest pain or cough.  She has no other complaints.  Rest of the 10 point ROS is below. ? ? ?MEDICAL HISTORY:  ?Past Medical History:  ?Diagnosis Date  ? Arthritis   ? Chronic kidney disease   ? Depression   ? Hair loss   ? d/t propranolol  ? hypothyroidism   ? Leukopenia 08/12/2012  ? ? ?SURGICAL HISTORY: ?Past Surgical History:  ?Procedure Laterality Date  ? CYSTOSCOPY/URETEROSCOPY/HOLMIUM LASER/STENT  PLACEMENT Right 04/23/2021  ? Procedure: CYSTOSCOPY/URETEROSCOPY/HOLMIUM LASER/STENT PLACEMENT;  Surgeon: Rene Paci, MD;  Location: [facility];  Service: Urology;  Laterality: Right;  ? EXTRACORPOREAL SHOCK WAVE LITHOTRIPSY Left 04/25/2017  ? Procedure: LEFT EXTRACORPOREAL SHOCK WAVE LITHOTRIPSY (ESWL);  Surgeon: Heloise Purpura, MD;  Location: [facility];  Service: Urology;  Laterality: Left;  ? KNEE ARTHROSCOPY Left 2oyrs ago  ? L knee x2/ R knee x1  ? LITHOTRIPSY  2012  ? difficulty voiding after procedure, d/c home with foley per pt  ? TUBAL LIGATION    ? WISDOM TOOTH EXTRACTION    ? teenager  ? ? ?SOCIAL HISTORY: ?Social History  ? ?Socioeconomic History  ? Marital status: Widowed  ?  Spouse name: Not on file  ? Number of children: 3  ? Years of education: College  ? Highest education level: Not on file  ?Occupational History  ? Not on file  ?Tobacco Use  ? Smoking status: Never  ? Smokeless tobacco: Never  ?Vaping Use  ? Vaping Use: Never used  ?Substance and Sexual Activity  ? Alcohol use: Yes  ?  Comment: socially  ? Drug use: No  ? Sexual activity: Not on file  ?Other Topics Concern  ? Not on file  ?Social History Narrative  ? Patient is a widow and lives alone with her dog.  ? Patient has three children.  ? Patient is retired.  ? Patient has a college education.  ? Patient is right-handed.  ? Caffeine: Patient drinks 1 cup of coffee 2-3 times per week  ?   ? ?Social Determinants of Health  ? ?  Financial Resource Strain: Not on file  ?Food Insecurity: Not on file  ?Transportation Needs: Not on file  ?Physical Activity: Not on file  ?Stress: Not on file  ?Social Connections: Not on file  ?Intimate Partner Violence: Not on file  ? ? ?FAMILY HISTORY: ?Family History  ?Problem Relation Age of Onset  ? Dementia Father   ? Cancer Brother   ? Colon cancer Maternal Grandmother   ? Other Son   ?     salivary gland "tumor"  ? ? ?ALLERGIES:  is allergic to erythromycin and penicillins. ? ?MEDICATIONS:  ?Current  Outpatient Medications  ?Medication Sig Dispense Refill  ? clopidogrel (PLAVIX) 75 MG tablet Take 1 tablet by mouth daily.    ? estradiol (ESTRACE) 0.1 MG/GM vaginal cream Place 1 Applicatorful vaginally 2 (two) times a week. As needed    ? famotidine (PEPCID) 40 MG tablet Take 40 mg by mouth 2 (two) times daily.    ? levothyroxine (LEVOTHROID) 25 MCG tablet Take 1 tablet (25 mcg total) by mouth daily. 90 tablet 3  ? spironolactone (ALDACTONE) 50 MG tablet Take 50 mg by mouth daily.    ? UNABLE TO FIND Med Name: collagen peptides powder    ? ?No current facility-administered medications for this visit.  ? ? ?REVIEW OF SYSTEMS:   ?Constitutional: ( - ) fevers, ( - )  chills , ( - ) night sweats ?Eyes: ( - ) blurriness of vision, ( - ) double vision, ( - ) watery eyes ?Ears, nose, mouth, throat, and face: ( - ) mucositis, ( - ) sore throat ?Respiratory: ( - ) cough, ( - ) dyspnea, ( - ) wheezes ?Cardiovascular: ( - ) palpitation, ( - ) chest discomfort, ( - ) lower extremity swelling ?Gastrointestinal:  ( - ) nausea, ( - ) heartburn, ( - ) change in bowel habits ?Skin: ( - ) abnormal skin rashes ?Lymphatics: ( - ) new lymphadenopathy, ( - ) easy bruising ?Neurological: ( - ) numbness, ( - ) tingling, ( - ) new weaknesses ?Behavioral/Psych: ( - ) mood change, ( - ) new changes  ?All other systems were reviewed with the patient and are negative. ? ?PHYSICAL EXAMINATION: ? ?Vitals:  ? 03/30/22 0836  ?BP: 123/62  ?Pulse: 66  ?Resp: 16  ?Temp: 98 ?F (36.7 ?C)  ?SpO2: 100%  ? ?Filed Weights  ? 03/30/22 0836  ?Weight: 139 lb 4.8 oz (63.2 kg)  ? ? ?GENERAL: Well-appearing elderly Caucasian female, alert, no distress and comfortable ?SKIN: skin color, texture, turgor are normal, no rashes or significant lesions ?EYES: conjunctiva are pink and non-injected, sclera clear.  ?LUNGS: clear to auscultation and percussion with normal breathing effort ?HEART: regular rate & rhythm and no murmurs and no lower extremity  edema ?Musculoskeletal: no cyanosis of digits and no clubbing  ?PSYCH: alert & oriented x 3, fluent speech ?NEURO: no focal motor/sensory deficits ? ?LABORATORY DATA:  ?I have reviewed the data as listed ? ?  Latest Ref Rng & Units 03/30/2022  ?  8:20 AM 03/16/2022  ?  8:31 AM 02/28/2022  ? 10:19 AM  ?CBC  ?WBC 4.0 - 10.5 K/uL 3.3   3.2   4.3    ?Hemoglobin 12.0 - 15.0 g/dL 16.113.5   09.612.8   04.512.9    ?Hematocrit 36.0 - 46.0 % 40.1   37.2   35.9    ?Platelets 150 - 400 K/uL 229   269   252    ? ? ? ?  Latest Ref Rng & Units 12/08/2021  ?  2:16 PM 04/23/2021  ?  2:23 AM 04/22/2021  ?  1:55 PM  ?CMP  ?Glucose 70 - 99 mg/dL 98   332   951    ?BUN 8 - 23 mg/dL 17   23   27     ?Creatinine 0.44 - 1.00 mg/dL   8.84   1.66    ?Sodium 135 - 145 mmol/L 138   135   136    ?Potassium 3.5 - 5.1 mmol/L 4.0   3.9   4.1    ?Chloride 98 - 111 mmol/L 104   108   102    ?CO2 22 - 32 mmol/L 27   24   27     ?Calcium 8.9 - 10.3 mg/dL 9.6   8.1   9.7    ?Total Protein 6.5 - 8.1 g/dL 7.0   5.1   7.2    ?Total Bilirubin 0.3 - 1.2 mg/dL 0.4   0.6   0.6    ?Alkaline Phos 38 - 126 U/L 60   43   56    ?AST 15 - 41 U/L 23   18   27     ?ALT 0 - 44 U/L 22   17   22     ? ?RADIOGRAPHIC STUDIES: ?No results found. ? ?ASSESSMENT & PLAN ?Linda Macdonald 81 y.o. female with medical history significant for hereditary hemochromatosis presents for a follow up visit.  ? 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 ?--Baseline labs including hemoglobin A1c, TSH, and echocardiogram showed no evidence of damage to organs from iron overload.  MRI previously confirmed heavy iron deposition in the liver and spleen. ?-- Labs today show a hemoglobin of 13.5, MCV of 94.4, and ferritin 27 ?-- HOLD phlebotomies while patient is in the 50-150 range. If >150 can restart q 2 week phlebotomies with goal of ferritin <50 ?-- Lab check in 8 weeks to determine if additional phlebotomies are  needed.  ?-- RTC in 16 weeks to see Dr. . ? ?No orders of the defined types were placed in this encounter. ? ? ?All questions were answered. The patient knows to call the clinic with any problems, questions or concern

## 2022-04-02 ENCOUNTER — Telehealth: Payer: Self-pay | Admitting: Hematology and Oncology

## 2022-04-02 NOTE — Telephone Encounter (Signed)
Per 4/21 los called and left pt a message about appointment.  Call back number left if changes are needed ?

## 2022-04-23 ENCOUNTER — Telehealth: Payer: Self-pay | Admitting: Hematology and Oncology

## 2022-04-23 NOTE — Telephone Encounter (Signed)
Rescheduled 06/19 to 06/26 per patient's request, called to confirm new date. ?

## 2022-05-22 ENCOUNTER — Other Ambulatory Visit: Payer: Self-pay | Admitting: Family Medicine

## 2022-05-22 DIAGNOSIS — R270 Ataxia, unspecified: Secondary | ICD-10-CM

## 2022-05-28 ENCOUNTER — Other Ambulatory Visit: Payer: Medicare Other

## 2022-06-03 ENCOUNTER — Ambulatory Visit
Admission: RE | Admit: 2022-06-03 | Discharge: 2022-06-03 | Disposition: A | Discharge status: Home / Self Care | Payer: Medicare Other | Source: Ambulatory Visit | Attending: Family Medicine | Admitting: Family Medicine

## 2022-06-03 DIAGNOSIS — R270 Ataxia, unspecified: Secondary | ICD-10-CM

## 2022-06-04 ENCOUNTER — Inpatient Hospital Stay: Payer: Medicare Other | Attending: Physician Assistant

## 2022-06-04 ENCOUNTER — Other Ambulatory Visit: Payer: Self-pay

## 2022-06-04 LAB — CBC WITH DIFFERENTIAL (CANCER CENTER ONLY)
Abs Immature Granulocytes: 0.01 10*3/uL (ref 0.00–0.07)
Basophils Absolute: 0 10*3/uL (ref 0.0–0.1)
Basophils Relative: 0 %
Eosinophils Absolute: 0.1 10*3/uL (ref 0.0–0.5)
Eosinophils Relative: 2 %
HCT: 42 % (ref 36.0–46.0)
Hemoglobin: 14.7 g/dL (ref 12.0–15.0)
Immature Granulocytes: 0 %
Lymphocytes Relative: 26 %
Lymphs Abs: 1.5 10*3/uL (ref 0.7–4.0)
MCH: 31.9 pg (ref 26.0–34.0)
MCHC: 35 g/dL (ref 30.0–36.0)
MCV: 91.1 fL (ref 80.0–100.0)
Monocytes Absolute: 0.5 10*3/uL (ref 0.1–1.0)
Monocytes Relative: 8 %
Neutro Abs: 3.6 10*3/uL (ref 1.7–7.7)
Neutrophils Relative %: 64 %
Platelet Count: 224 10*3/uL (ref 150–400)
RBC: 4.61 MIL/uL (ref 3.87–5.11)
RDW: 12.9 % (ref 11.5–15.5)
WBC Count: 5.7 10*3/uL (ref 4.0–10.5)
nRBC: 0 % (ref 0.0–0.2)

## 2022-06-04 LAB — FERRITIN: Ferritin: 32 ng/mL (ref 11–307)

## 2022-06-05 ENCOUNTER — Telehealth: Payer: Self-pay

## 2022-06-05 NOTE — Telephone Encounter (Signed)
Pt advised with VU 

## 2022-06-22 ENCOUNTER — Ambulatory Visit: Payer: Medicare Other | Attending: Family Medicine | Admitting: Physical Therapy

## 2022-06-22 ENCOUNTER — Ambulatory Visit: Payer: Medicare Other | Admitting: Speech Pathology

## 2022-06-22 ENCOUNTER — Ambulatory Visit: Payer: Medicare Other | Admitting: Occupational Therapy

## 2022-06-22 ENCOUNTER — Encounter: Payer: Self-pay | Admitting: Physical Therapy

## 2022-06-22 VITALS — BP 108/56 | HR 60

## 2022-06-22 DIAGNOSIS — R260 Ataxic gait: Secondary | ICD-10-CM | POA: Insufficient documentation

## 2022-06-22 DIAGNOSIS — R41840 Attention and concentration deficit: Secondary | ICD-10-CM | POA: Insufficient documentation

## 2022-06-22 DIAGNOSIS — M6281 Muscle weakness (generalized): Secondary | ICD-10-CM

## 2022-06-22 DIAGNOSIS — R2681 Unsteadiness on feet: Secondary | ICD-10-CM | POA: Diagnosis present

## 2022-06-22 DIAGNOSIS — R1310 Dysphagia, unspecified: Secondary | ICD-10-CM | POA: Diagnosis present

## 2022-06-22 NOTE — Therapy (Signed)
OUTPATIENT OCCUPATIONAL THERAPY NEURO EVALUATION  Patient Name: Linda Macdonald MRN: [id number] DOB:[birth year], 81 y.o., female Today's Date: 06/22/2022  PCP: Dr. Hal Hope REFERRING PROVIDER: Dr. Hal Hope   OT End of Session - 06/22/22 0857     Visit Number 1    Number of Visits 1    Authorization Type U[employer]    OT Start Time 0803    OT Stop Time 0843    OT Time Calculation (min) 40 min             Past Medical History:  Diagnosis Date   Arthritis    Chronic kidney disease    Depression    Hair loss    d/t propranolol   hypothyroidism    Leukopenia 08/12/2012   Past Surgical History:  Procedure Laterality Date   CYSTOSCOPY/URETEROSCOPY/HOLMIUM LASER/STENT PLACEMENT Right 04/23/2021   Procedure: CYSTOSCOPY/URETEROSCOPY/HOLMIUM LASER/STENT PLACEMENT;  Surgeon: Rene Paci, MD;  Location: [facility];  Service: Urology;  Laterality: Right;   EXTRACORPOREAL SHOCK WAVE LITHOTRIPSY Left 04/25/2017   Procedure: LEFT EXTRACORPOREAL SHOCK WAVE LITHOTRIPSY (ESWL);  Surgeon: Heloise Purpura, MD;  Location: [facility];  Service: Urology;  Laterality: Left;   KNEE ARTHROSCOPY Left 2oyrs ago   L knee x2/ R knee x1   LITHOTRIPSY  2012   difficulty voiding after procedure, d/c home with foley per pt   TUBAL LIGATION     WISDOM TOOTH EXTRACTION     teenager   Patient Active Problem List   Diagnosis Date Noted   Hereditary hemochromatosis (HCC) 12/15/2021   Iron overload 12/11/2021   Hair loss 04/23/2021   Acute lower UTI 04/23/2021   Hydronephrosis with renal calculous obstruction 04/22/2021   Abnormal finding on GI tract imaging 01/17/2021   Colon cancer screening 01/17/2021   Dysphagia 01/17/2021   Family history of malignant neoplasm of gastrointestinal tract 01/17/2021   Gastroesophageal reflux disease 01/17/2021   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   Personal history of colonic polyps 01/17/2021   Slow transit constipation 01/17/2021   Other headache syndrome 01/18/2019    FHx: dementia 01/18/2019   Headache    Tremor    Complaints of memory disturbance 08/20/2018   Dizziness 03/10/2018   Benign essential tremor 03/10/2018   Disequilibrium 03/10/2018   History of stroke 02/18/2018   Dysesthesia of multiple sites 03/19/2014   Restless legs syndrome (RLS) 03/19/2014   Anemia associated with nutritional deficiency 03/19/2014   Hypersomnia with sleep apnea, unspecified 03/19/2014   Deep venous thrombosis of distal leg (HCC) 10/22/2012   Medial meniscus tear 10/22/2012   Pseudogout 10/22/2012   Acute medial meniscus tear of left knee 10/16/2012   Leukopenia 08/12/2012   Iritis, primary, acute/subacute 06/16/2012   Posterior vitreous detachment, right eye 06/09/2012   CALCULUS OF KIDNEY 10/06/2010   WEIGHT LOSS 08/10/2010   DIARRHEA 08/10/2010   ABDOMINAL PAIN RIGHT LOWER QUADRANT 08/10/2010   ERUCTATION 04/18/2010   HYPERSOMNIA 01/24/2010   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   VENOUS INSUFFICIENCY, LEGS 08/29/2009   Hypothyroidism 04/21/2008   DEPRESSION 04/21/2008   SLEEP APNEA, OBSTRUCTIVE 04/21/2008   DEEP VENOUS THROMBOPHLEBITIS, LEFT, LEG, HX OF 04/21/2008   WISDOM TEETH EXTRACTION, HX OF 04/21/2008    ONSET DATE: 05/25/22 referral date  REFERRING DIAG: ataxia  THERAPY DIAG:  Muscle weakness (generalized)  Attention and concentration deficit  Rationale for Evaluation and Treatment Rehabilitation  SUBJECTIVE:   SUBJECTIVE STATEMENT: Pt reports she is not sure why she is here Pt accompanied by: self  PERTINENT HISTORY:anemia, essential tremor,  prior renal calculi, history of DVT, , depression, GERD, hypothyroidism, CVA, RLS, OSA, CKD,  hereditary hemochromatosis  PRECAUTIONS: Fall  WEIGHT BEARING RESTRICTIONS No  PAIN:  Are you having pain? No  FALLS: Has patient fallen in last 6 months? No  LIVING ENVIRONMENT: Lives with: lives alone Lives in: House/apartment   PLOF: Independent  PATIENT GOALS: improve  grip  OBJECTIVE:   HAND DOMINANCE: Right  ADLs: Overall ADLs: modified independent with all basic ADLS Transfers/ambulation related to ADLs: Eating: independent Grooming: independent UB Dressing: independent LB Dressing: independent Toileting: independent Bathing: independent Tub Shower transfers: independent    IADLs: Shopping: independent Light housekeeping: independent Meal Prep: independent, had burned rice but pt reports she is now setting a timer as a reminder or using rice cooker  Medication management: independent Financial management: independent Handwriting: 100% legible  MOBILITY STATUS: Independent     UPPER EXTREMITY ROM   A/ROM WFLS  \d)   UPPER EXTREMITY MMT:   bilateral UE strength grossly 4+/5    HAND FUNCTION: Grip strength: Right: 37.2 lbs; Left: 39 lbs  COORDINATION: 9 Hole Peg test: Right: 25.59 sec; Left: 24.88 sec  SENSATION: WFL      COGNITION: Overall cognitive status: impulsive, difficulty following instructions for task, tangential during discussion. Pt recalls 3/3 words following a delay. Pt was able to spell WORLD backwards  VISION: Subjective report: Pt reports some double vision that is corrected by glasses Baseline vision: Wears glasses all the time Visual history:  hx of diplopia  VISION ASSESSMENT: Ocular ROM: WFL Diplopia assessment: objects splint on top of each, pt has corrective lenses that correct double vision, pt reports      OBSERVATIONS: a little impulsive and tangential   TODAY'S TREATMENT:  Pt was educated in red putty HEP for grip and pinch as well as cognitive tips(ie:use of timers for cooking and avoiding distractions when driving). See pt instructions.   PATIENT EDUCATION: Education details: HEP red putty, cognitive tips Person educated: Patient Education method: Programmer, multimedia, Demonstration, Verbal cues, and Handouts Education comprehension: verbalized understanding, returned  demonstration, and verbal cues required   HOME EXERCISE PROGRAM: Red putty    GOALS:N/A- 1 x visit, treatment performed on day of eval  ASSESSMENT:  CLINICAL IMPRESSION: Patient is a 81 y.o. female who was seen today for occupational therapy evaluation for ataxia.  medical history significant of anemia, essential tremor, prior renal calculi, history of DVT, , depression, GERD, hypothyroidism, CVA, RLS, OSA, CKD ,  hereditary hemochromatosis  PERFORMANCE DEFICITS in functional skills including strength, mobility, and balance, cognitive skills including attention, memory, problem solving, safety awareness, thought, and understand, and psychosocial skills including coping strategies, habits, and routines and behaviors.   IMPAIRMENTS are limiting patient from IADLs.   COMORBIDITIES may have co-morbidities  that affects occupational performance. Patient will benefit from skilled OT to address above impairments and improve overall function.  MODIFICATION OR ASSISTANCE TO COMPLETE EVALUATION: No modification of tasks or assist necessary to complete an evaluation.  OT OCCUPATIONAL PROFILE AND HISTORY: Problem focused assessment: Including review of records relating to presenting problem.  CLINICAL DECISION MAKING: LOW - limited treatment options, no task modification necessary  REHAB POTENTIAL: Good  EVALUATION COMPLEXITY: Low    PLAN: OT FREQUENCY: one time visit  OT DURATION: 4 weeks  PLANNED INTERVENTIONS: self care/ADL training, therapeutic exercise, patient/family education, cognitive remediation/compensation, and visual/perceptual remediation/compensation  RECOMMENDED OTHER SERVICES: PT  CONSULTED AND AGREED WITH PLAN OF CARE: Patient  PLAN FOR NEXT SESSION: n/s 1x  visit, pt was provided with HEP to address grip strength and cognitive tips. No additional OT recommended at this time.   Broly Hatfield, OT 06/22/2022, 9:08 AM

## 2022-06-22 NOTE — Patient Instructions (Addendum)
[place]-- stick to one thing at a time  Use your external aids  Timers for cooking  Recipe, mark off what step your on for more complex recipes  To-do lists to ensure you are managing your tasks  Write down things you need to remember  Internal Memory Strategies:  Repeat it out loud Associate it with something else (when I take my meds, I need to remember to call the pharmacy)  We are here to help if you find that your thinking changes are getting worse or are impacting your ability to participate safely in desired activities. For now, keep using the strategies you have implemented at home.   The best activities to improve cognition are functional, real life activities that are important to you:  Plan a menu Managing finances  Plan a party, trip or tailgate with all of the details (even if you aren't really going to carry it out) Participate in your hobby, try and learn something new about it Manage your texts, emails Google search for items (even if you're not really going to buy anything) and compare prices and features Socialize Listen to and discuss [employer] or Podcasts Read and discuss short articles of interest to you- Take notes on these if memory is a challenge Discuss social media posts Look and discuss photo albums  Apps:  [employer]

## 2022-06-22 NOTE — Patient Instructions (Addendum)
       Cognitive tips-  -when performing tasks such as driving, minimize all distractions so that you can focus on driving, double check before changing lanes.  -When cooking set a timer to remind you to check on items  -Use a calendar or reminder in your cell phone to keep up with appointments                  1. Grip Strengthening (Resistive Putty)   Squeeze putty using thumb and all fingers. Repeat _20___ times. Do __2__ sessions per day.   2. Roll putty into tube on table and pinch between each finger and thumb x 10 reps each. (can do ring and small finger together)     Copyright  VHI. All rights reserved.

## 2022-06-22 NOTE — Therapy (Signed)
OUTPATIENT PHYSICAL THERAPY NEURO EVALUATION   Patient Name: Linda Macdonald MRN: [id number] DOB:1941/07/12, 81 y.o., female Today's Date: 06/22/2022   PCP: Dois Davenport., MD REFERRING PROVIDER: Dois Davenport, MD    PT End of Session - 06/22/22 0948     Visit Number 1    Authorization Type [employer] MEDICARE    Progress Note Due on Visit 10    PT Start Time [phone]   PT ran over with last eval.   PT Stop Time 1022    PT Time Calculation (min) 45 min    Activity Tolerance Patient tolerated treatment well    Behavior During Therapy [facility] for tasks assessed/performed             Past Medical History:  Diagnosis Date   Arthritis    Chronic kidney disease    Depression    Hair loss    d/t propranolol   hypothyroidism    Leukopenia 08/12/2012   Past Surgical History:  Procedure Laterality Date   CYSTOSCOPY/URETEROSCOPY/HOLMIUM LASER/STENT PLACEMENT Right 04/23/2021   Procedure: CYSTOSCOPY/URETEROSCOPY/HOLMIUM LASER/STENT PLACEMENT;  Surgeon: Rene Paci, MD;  Location: [facility];  Service: Urology;  Laterality: Right;   EXTRACORPOREAL SHOCK WAVE LITHOTRIPSY Left 04/25/2017   Procedure: LEFT EXTRACORPOREAL SHOCK WAVE LITHOTRIPSY (ESWL);  Surgeon: Heloise Purpura, MD;  Location: [facility];  Service: Urology;  Laterality: Left;   KNEE ARTHROSCOPY Left 2oyrs ago   L knee x2/ R knee x1   LITHOTRIPSY  2012   difficulty voiding after procedure, d/c home with foley per pt   TUBAL LIGATION     WISDOM TOOTH EXTRACTION     teenager   Patient Active Problem List   Diagnosis Date Noted   Hereditary hemochromatosis (HCC) 12/15/2021   Iron overload 12/11/2021   Hair loss 04/23/2021   Acute lower UTI 04/23/2021   Hydronephrosis with renal calculous obstruction 04/22/2021   Abnormal finding on GI tract imaging 01/17/2021   Colon cancer screening 01/17/2021   Dysphagia 01/17/2021   Family history of malignant neoplasm of gastrointestinal tract 01/17/2021   Gastroesophageal  reflux disease 01/17/2021   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   Personal history of colonic polyps 01/17/2021   Slow transit constipation 01/17/2021   Other headache syndrome 01/18/2019   FHx: dementia 01/18/2019   Headache    Tremor    Complaints of memory disturbance 08/20/2018   Dizziness 03/10/2018   Benign essential tremor 03/10/2018   Disequilibrium 03/10/2018   History of stroke 02/18/2018   Dysesthesia of multiple sites 03/19/2014   Restless legs syndrome (RLS) 03/19/2014   Anemia associated with nutritional deficiency 03/19/2014   Hypersomnia with sleep apnea, unspecified 03/19/2014   Deep venous thrombosis of distal leg (HCC) 10/22/2012   Medial meniscus tear 10/22/2012   Pseudogout 10/22/2012   Acute medial meniscus tear of left knee 10/16/2012   Leukopenia 08/12/2012   Iritis, primary, acute/subacute 06/16/2012   Posterior vitreous detachment, right eye 06/09/2012   CALCULUS OF KIDNEY 10/06/2010   WEIGHT LOSS 08/10/2010   DIARRHEA 08/10/2010   ABDOMINAL PAIN RIGHT LOWER QUADRANT 08/10/2010   ERUCTATION 04/18/2010   HYPERSOMNIA 01/24/2010   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   VENOUS INSUFFICIENCY, LEGS 08/29/2009   Hypothyroidism 04/21/2008   DEPRESSION 04/21/2008   SLEEP APNEA, OBSTRUCTIVE 04/21/2008   DEEP VENOUS THROMBOPHLEBITIS, LEFT, LEG, HX OF 04/21/2008   WISDOM TEETH EXTRACTION, HX OF 04/21/2008    ONSET DATE: 06/14/2022 (referral date)  REFERRING DIAG: R27.0 (ICD-10-CM) - Ataxia   THERAPY DIAG:  Ataxic gait  Unsteadiness on feet  Rationale for Evaluation and Treatment Rehabilitation  SUBJECTIVE:                                                                                                                                                                                              SUBJECTIVE STATEMENT: "It's just that it seems that when I'm walking I have to concentrate on walking a straight line.  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 No  PRECAUTIONS: Fall  WEIGHT BEARING RESTRICTIONS No  FALLS: Has patient fallen in last 6 months? No  LIVING ENVIRONMENT: Lives with: lives alone Lives in: House/apartment Stairs: Yes: Internal: 13 steps; on left going up and External: 3 steps; on left going up-pt does not use stairs inside unless she wants to go to lower guest level, she enters on the level she lives on. Has following equipment at home: Grab bars  PLOF: Independent-pt takes cares of dog, goes for walks frequently, and performs strength training 2x per week.  PATIENT GOALS "Evaluation."  OBJECTIVE:   DIAGNOSTIC FINDINGS: MRI of brain from 06/03/2022: No evidence of acute intracranial abnormality.   COGNITION: Overall cognitive status: No family/caregiver present to determine baseline cognitive functioning and pt states multi-tasking is hard in the kitchen so she uses timer.   SENSATION: Light touch: WFL  COORDINATION: Heel-to-shin:  WNL BLE LE RAMPS:   WNL BLE  EDEMA:  None noted bilaterally.  MUSCLE TONE: None noted bilaterally.  POSTURE: No Significant postural limitations  LOWER EXTREMITY ROM:     Active  Right Eval Left Eval  Hip flexion WNL WNL  Hip extension    Hip abduction    Hip adduction    Hip internal rotation    Hip external rotation    Knee flexion    Knee extension    Ankle dorsiflexion    Ankle plantarflexion    Ankle inversion    Ankle eversion     (Blank rows = not tested)  LOWER EXTREMITY MMT:    MMT Right Eval Left Eval  Hip flexion Symmetrical bilaterally; grossly 4+/5  Hip extension    Hip abduction   Hip adduction   Hip internal rotation   Hip external rotation   Knee flexion  Knee extension   Ankle dorsiflexion   Ankle plantarflexion   Ankle inversion    Ankle eversion    (Blank rows = not tested)  BED MOBILITY:  Pt states independence.  TRANSFERS: Assistive device utilized: None  Sit to stand: Complete Independence Stand to sit: Complete Independence Chair to chair: Complete Independence  STAIRS:  Level of Assistance: Complete Independence  Stair Negotiation Technique: Alternating Pattern  Forwards with No Rails  Number of Stairs: 4   Height of Stairs: 6"   GAIT: Gait pattern: antalgic Distance walked: 230' Assistive device utilized: None Level of assistance: Complete Independence Comments: Pt states she intermittently experiences mild left hip soreness and mild left foot slap during ambulation, can intermittently hear Bilateral foot slap, likely due to flat foot wear impacting heel strike.  FUNCTIONAL TESTs:  5 times sit to stand: 9.78 sec w/o UE support 10 meter walk test: 9.78 sec no AD = 1.02 m/sec OR 3.37 ft/sec Functional gait assessment: 28/30  PATIENT SURVEYS:  FOTO not applicable.  TODAY'S TREATMENT:  N/A   PATIENT EDUCATION: Education details: PT POC, assessments used, and HEP established. Person educated: Patient Education method: Explanation, Demonstration, and Handouts Education comprehension: verbalized understanding and returned demonstration   HOME EXERCISE PROGRAM: Access Code: [email or web address] URL: [email or web address] Date: 06/22/2022 Prepared by: Camille Bal  Exercises - Supine Bridge  - 1 x daily - 5 x weekly - 3 sets - 10 reps - Clamshell with Resistance  - 1 x daily - 5 x weekly - 3 sets - 10 reps - Walking Tandem Stance  - 1 x daily - 5 x weekly - 3 sets - 10 reps - Backward Tandem Walking with Counter Support  - 1 x daily - 5 x weekly - 3 sets - 10 reps - Walking with Head Rotation  - 1 x  daily - 5 x weekly - 3 sets - 10 reps  ASSESSMENT:  CLINICAL IMPRESSION: Patient is a 81 y.o. female who was seen today for physical therapy evaluation and treatment for recent worsening of ataxia.  Pt has a significant PMH of hypothyroidism, depression, OSA, periodic limb movement disorder, LE venous insufficiency, left LE DVT, Medial meniscus tear of left knee-2013, CVA, and dementia.  Identified impairments include mild difficulty multi-tasking and intermittent left hip discomfort.  Evaluation via the following assessment tools: 5xSTS, , and FGA did not indicate fall risk.  At this time she would not benefit from skilled PT and is in agreement to manage noted deficits with HEP established this visit with education to return if functional change occurs.  CLINICAL DECISION MAKING: Stable/uncomplicated  EVALUATION COMPLEXITY: Low  PLAN: PT FREQUENCY: one time visit  PT DURATION: other: 1x visit  Sadie Haber, PT, DPT 06/22/2022, 10:22 AM

## 2022-06-22 NOTE — Therapy (Signed)
OUTPATIENT SPEECH LANGUAGE PATHOLOGY EVALUATION   Patient Name: Linda Macdonald MRN: [id number] DOB:[birth year], 81 y.o., female Today's Date: 06/22/2022  PCP: Dois Davenport, MD  REFERRING PROVIDER: Dois Davenport, MD    End of Session - 06/22/22 0957     Visit Number 1    Number of Visits 1    SLP Start Time 0845    SLP Stop Time  0931    SLP Time Calculation (min) 46 min    Activity Tolerance Patient tolerated treatment well             Past Medical History:  Diagnosis Date   Arthritis    Chronic kidney disease    Depression    Hair loss    d/t propranolol   hypothyroidism    Leukopenia 08/12/2012   Past Surgical History:  Procedure Laterality Date   CYSTOSCOPY/URETEROSCOPY/HOLMIUM LASER/STENT PLACEMENT Right 04/23/2021   Procedure: CYSTOSCOPY/URETEROSCOPY/HOLMIUM LASER/STENT PLACEMENT;  Surgeon: Rene Paci, MD;  Location: [facility];  Service: Urology;  Laterality: Right;   EXTRACORPOREAL SHOCK WAVE LITHOTRIPSY Left 04/25/2017   Procedure: LEFT EXTRACORPOREAL SHOCK WAVE LITHOTRIPSY (ESWL);  Surgeon: Heloise Purpura, MD;  Location: [facility];  Service: Urology;  Laterality: Left;   KNEE ARTHROSCOPY Left 2oyrs ago   L knee x2/ R knee x1   LITHOTRIPSY  2012   difficulty voiding after procedure, d/c home with foley per pt   TUBAL LIGATION     WISDOM TOOTH EXTRACTION     teenager   Patient Active Problem List   Diagnosis Date Noted   Hereditary hemochromatosis (HCC) 12/15/2021   Iron overload 12/11/2021   Hair loss 04/23/2021   Acute lower UTI 04/23/2021   Hydronephrosis with renal calculous obstruction 04/22/2021   Abnormal finding on GI tract imaging 01/17/2021   Colon cancer screening 01/17/2021   Dysphagia 01/17/2021   Family history of malignant neoplasm of gastrointestinal tract 01/17/2021   Gastroesophageal reflux disease 01/17/2021   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   Personal history of colonic polyps 01/17/2021   Slow transit constipation  01/17/2021   Other headache syndrome 01/18/2019   FHx: dementia 01/18/2019   Headache    Tremor    Complaints of memory disturbance 08/20/2018   Dizziness 03/10/2018   Benign essential tremor 03/10/2018   Disequilibrium 03/10/2018   History of stroke 02/18/2018   Dysesthesia of multiple sites 03/19/2014   Restless legs syndrome (RLS) 03/19/2014   Anemia associated with nutritional deficiency 03/19/2014   Hypersomnia with sleep apnea, unspecified 03/19/2014   Deep venous thrombosis of distal leg (HCC) 10/22/2012   Medial meniscus tear 10/22/2012   Pseudogout 10/22/2012   Acute medial meniscus tear of left knee 10/16/2012   Leukopenia 08/12/2012   Iritis, primary, acute/subacute 06/16/2012   Posterior vitreous detachment, right eye 06/09/2012   CALCULUS OF KIDNEY 10/06/2010   WEIGHT LOSS 08/10/2010   DIARRHEA 08/10/2010   ABDOMINAL PAIN RIGHT LOWER QUADRANT 08/10/2010   ERUCTATION 04/18/2010   HYPERSOMNIA 01/24/2010   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   VENOUS INSUFFICIENCY, LEGS 08/29/2009   Hypothyroidism 04/21/2008   DEPRESSION 04/21/2008   SLEEP APNEA, OBSTRUCTIVE 04/21/2008   DEEP VENOUS THROMBOPHLEBITIS, LEFT, LEG, HX OF 04/21/2008   WISDOM TEETH EXTRACTION, HX OF 04/21/2008    ONSET DATE: 05/25/22 referral date   REFERRING DIAG: R27.0 (ICD-10-CM) - Ataxia   THERAPY DIAG:  Dysphagia, unspecified type  Rationale for Evaluation and Treatment Rehabilitation  SUBJECTIVE:   SUBJECTIVE STATEMENT: "I was saying I feel a little off" Pt  accompanied by: self  PERTINENT HISTORY: "small stroke" 4-5 years ago per pt. Lives independently, reports to feeling "off," primarily characterized by deficits in attention.   PAIN:  Are you having pain? No   FALLS: Has patient fallen in last 6 months?  No  LIVING ENVIRONMENT: Lives with: lives alone Lives in: House/apartment  PLOF:  Level of assistance: Independent with IADLs Employment: Retired   PATIENT GOALS  "to be self-sufficient as possible"  OBJECTIVE:   DIAGNOSTIC FINDINGS: 06/03/2022 MR BRAIN WO CONTRAST IMPRESSION: No evidence of acute intracranial abnormality.   Prominent perivascular spaces within the left thalamus and inferior basal ganglia.   Otherwise unremarkable non-contrast MRI appearance of the brain for age.  COGNITION: Overall cognitive status: No family/caregiver present to determine baseline cognitive functioning Areas of impairment:  Attention: Impaired: Focused, Sustained, Selective, Alternating, Divided Memory: Impaired: Short term Prospective Behavior: Impulsive Functional deficits: not currently impacting IADLs; is implementing self-determined strategies and compensations with success per pt report  AUDITORY COMPREHENSION: Overall auditory comprehension: Appears intact YES/NO questions: Appears intact Following directions: Appears intact Conversation: Moderately Complex Interfering components: attention and processing speed Effective technique: extra processing time  EXPRESSION: verbal  VERBAL EXPRESSION: Level of generative/spontaneous verbalization: conversation Automatic speech: social response: intact and month of year: intact  Repetition: Appears intact Naming: Divergent: 76-100% 23 animals; 10 letter "m" Pragmatics: Impaired: abnormal effect, topic appropriateness, and topic maintenance Interfering components: attention Effective technique: open ended questions Non-verbal means of communication: N/A Comments: distractibility evidenced throughout conversation, demonstrates awareness and ability to re-orient to conversation, completing original thought. Overall demonstrates ability to express herself within functional limits.   MOTOR SPEECH: Overall motor speech: Appears intact Respiration: thoracic breathing Phonation: normal Resonance: WFL Articulation: Appears intact Intelligibility: Intelligible Motor planning: Appears intact   ORAL  MOTOR EXAMINATION Overall status: WFL  CLINICAL SWALLOW ASSESSMENT:   Current diet: regular Dentition: adequate natural dentition Patient directly observed with POs: No Feeding: able to feed self Liquids provided by: n/a Oral phase signs and symptoms:  n/a Pharyngeal phase signs and symptoms:  n/a Comments: reports to eating regular diet, w/o issues    STANDARDIZED ASSESSMENTS: Deferred at this time.   TODAY'S TREATMENT:  SLP provides education on attention and memory compensations and strategies patient can implement to persist in independent living. Given a scenario, pt able to ID appropriate strategy in 3/4 trials with mod-I. Education on evaluation results and SLP observations. Pt provided opportunity to ask all question, all answered. Pt verbalizes understanding of all presented material.    PATIENT EDUCATION: Education details: see above Person educated: Patient Education method: Explanation, Demonstration, and Handouts Education comprehension: verbalized understanding and returned demonstration  ASSESSMENT:  CLINICAL IMPRESSION: Patient is a 81 y.o. F who was seen today for cognitive linguistic assessment. Pt attends session independently, reports small change in cognition baseline, characterized by increased distractibility. This observed throughout evaluation with pt demonstrating overt deviations in attention x7 over 20 minute period. Is aware of deviations and able to redirect attention back to SLP in all opportunities. Attention impacting verbal expression as pt demonstrating mazing throughout connected speech. Again, demonstrates awareness and is able to self-redirect, resulting in ultimately cohesive relaying of thoughts and ideas. Auditory comprehension challenged in conversation and complex yes/no questions with pt demonstrating abilities [facility]. Despite change in baseline re: cognitive function, I do not recommend skilled ST at this time. Pt has awareness of deficits and  has identified and implemented compensations/strategies to enable successful participation in desired activities. SLP provided education  to patient on returning for subsequent evaluation should cogntitive decline worsen or patient finds cognitive changes impacting participation and safety during daily tasks.   OBJECTIVE IMPAIRMENTS include attention, memory, and expressive language. These impairments are limiting patient from  none . Factors affecting potential to achieve goals and functional outcome are  no overt factors . Skilled SLP services are not recommended at this time.   REHAB POTENTIAL: Good  PLAN: SLP FREQUENCY: one time visit  SLP DURATION: other: eval + treat  PLANNED INTERVENTIONS:  n/a    Maia Breslow, CCC-SLP 06/22/2022, 9:58 AM

## 2022-07-20 ENCOUNTER — Other Ambulatory Visit: Payer: Self-pay

## 2022-07-20 ENCOUNTER — Inpatient Hospital Stay: Payer: Medicare Other | Admitting: Hematology and Oncology

## 2022-07-20 ENCOUNTER — Inpatient Hospital Stay: Payer: Medicare Other | Attending: Physician Assistant

## 2022-07-20 ENCOUNTER — Other Ambulatory Visit: Payer: Self-pay | Admitting: Hematology and Oncology

## 2022-07-20 ENCOUNTER — Telehealth: Payer: Self-pay | Admitting: Hematology and Oncology

## 2022-07-20 DIAGNOSIS — Z800 Family history of malignant neoplasm of digestive organs: Secondary | ICD-10-CM | POA: Diagnosis not present

## 2022-07-20 DIAGNOSIS — N189 Chronic kidney disease, unspecified: Secondary | ICD-10-CM | POA: Insufficient documentation

## 2022-07-20 LAB — CMP (CANCER CENTER ONLY)
ALT: 17 U/L (ref 0–44)
AST: 22 U/L (ref 15–41)
Albumin: 4.4 g/dL (ref 3.5–5.0)
Alkaline Phosphatase: 64 U/L (ref 38–126)
Anion gap: 5 (ref 5–15)
BUN: 17 mg/dL (ref 8–23)
CO2: 28 mmol/L (ref 22–32)
Calcium: 9.5 mg/dL (ref 8.9–10.3)
Chloride: 100 mmol/L (ref 98–111)
Creatinine: 0.93 mg/dL (ref 0.44–1.00)
GFR, Estimated: >60 mL/min (ref >60)
Glucose, Bld: 86 mg/dL (ref 70–99)
Potassium: 4.2 mmol/L (ref 3.5–5.1)
Sodium: 133 mmol/L — ABNORMAL LOW (ref 135–145)
Total Bilirubin: 0.6 mg/dL (ref 0.3–1.2)
Total Protein: 6.6 g/dL (ref 6.5–8.1)

## 2022-07-20 LAB — CBC WITH DIFFERENTIAL (CANCER CENTER ONLY)
Abs Immature Granulocytes: 0.01 10*3/uL (ref 0.00–0.07)
Basophils Absolute: 0 10*3/uL (ref 0.0–0.1)
Basophils Relative: 1 %
Eosinophils Absolute: 0.1 10*3/uL (ref 0.0–0.5)
Eosinophils Relative: 2 %
HCT: 41 % (ref 36.0–46.0)
Hemoglobin: 14.9 g/dL (ref 12.0–15.0)
Immature Granulocytes: 0 %
Lymphocytes Relative: 28 %
Lymphs Abs: 1.2 10*3/uL (ref 0.7–4.0)
MCH: 32.5 pg (ref 26.0–34.0)
MCHC: 36.3 g/dL — ABNORMAL HIGH (ref 30.0–36.0)
MCV: 89.5 fL (ref 80.0–100.0)
Monocytes Absolute: 0.4 10*3/uL (ref 0.1–1.0)
Monocytes Relative: 8 %
Neutro Abs: 2.6 10*3/uL (ref 1.7–7.7)
Neutrophils Relative %: 61 %
Platelet Count: 226 10*3/uL (ref 150–400)
RBC: 4.58 MIL/uL (ref 3.87–5.11)
RDW: 13.2 % (ref 11.5–15.5)
WBC Count: 4.3 10*3/uL (ref 4.0–10.5)
nRBC: 0 % (ref 0.0–0.2)

## 2022-07-20 LAB — RETIC PANEL
Immature Retic Fract: 5.6 % (ref 2.3–15.9)
RBC.: 4.61 MIL/uL (ref 3.87–5.11)
Retic Count, Absolute: 58.5 10*3/uL (ref 19.0–186.0)
Retic Ct Pct: 1.3 % (ref 0.4–3.1)
Reticulocyte Hemoglobin: 37.3 pg (ref >27.9)

## 2022-07-20 LAB — IRON AND IRON BINDING CAPACITY (CC-WL,HP ONLY)
Iron: 221 ug/dL — ABNORMAL HIGH (ref 28–170)
Saturation Ratios: 75 % — ABNORMAL HIGH (ref 10.4–31.8)
TIBC: 297 ug/dL (ref 250–450)
UIBC: 76 ug/dL — ABNORMAL LOW (ref 148–442)

## 2022-07-20 LAB — FERRITIN: Ferritin: 40 ng/mL (ref 11–307)

## 2022-07-20 NOTE — Telephone Encounter (Signed)
Per 8/11 los called and spoke to pt about appointment pt confirmed appointments

## 2022-07-20 NOTE — Progress Notes (Signed)
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 Testing showed ferritin 173 and genetic testing consistent with  Homozygous C282Y 12/22/2021: Started phlebotomy q 2 weeks.  02/28/2022: last phlebotomy performed.   Interval History:  Linda Macdonald 81 y.o. female with medical history significant for hereditary hemochromatosis presents for a follow up visit. Patient was last seen on 03/30/2022. In the interim since the last visit she has not required any further therapeutic phlebotomy.  On exam today Mrs. Degroff reports she has been well overall in the interim since her last visit.  She reports that she realize she was cooking with a cast iron skillet and recently stopped using it.  She been using it every day, even cut her toes.  She reports that she is not using a titanium skillet.  She notes that she has been well interim since her last vomiting.  Her energy levels have been good though she does take about a 20-minute nap per day.  She is doing her best to hydrate well and she reports "I take care of myself best I can".  Overall there have been no other major changes in her health.  No hospitalizations, services, or new medications.  She denies fevers, chills, night sweats, shortness of breath, chest pain or cough.  She has no other complaints.  Rest of the 10 point ROS is below.   MEDICAL HISTORY:  Past Medical History:  Diagnosis Date   Arthritis    Chronic kidney disease    Depression    Hair loss    d/t propranolol    hypothyroidism    Leukopenia 08/12/2012    SURGICAL HISTORY: Past Surgical History:  Procedure Laterality Date   CYSTOSCOPY/URETEROSCOPY/HOLMIUM LASER/STENT PLACEMENT Right 04/23/2021   Procedure: CYSTOSCOPY/URETEROSCOPY/HOLMIUM LASER/STENT PLACEMENT;  Surgeon: Rene Paci, MD;  Location: [facility];  Service: Urology;  Laterality: Right;   EXTRACORPOREAL SHOCK WAVE LITHOTRIPSY Left 04/25/2017   Procedure: LEFT EXTRACORPOREAL SHOCK WAVE LITHOTRIPSY (ESWL);  Surgeon: Heloise Purpura, MD;  Location: [facility];  Service: Urology;  Laterality: Left;   KNEE ARTHROSCOPY Left 2oyrs ago   L knee x2/ R knee x1   LITHOTRIPSY  2012   difficulty voiding after procedure, d/c home with foley per pt   TUBAL LIGATION     WISDOM TOOTH EXTRACTION     teenager    SOCIAL HISTORY: Social History   Socioeconomic History   Marital status: Widowed    Spouse name: Not on file   Number of children: 3   Years of education: College   Highest education level: Not on file  Occupational History   Not on file  Tobacco Use   Smoking status: Never   Smokeless tobacco: Never  Vaping Use   Vaping Use: Never used  Substance and Sexual Activity   Alcohol use: Yes    Comment: socially   Drug use: No   Sexual activity: Not on file  Other Topics Concern   Not on file  Social History Narrative   Patient is a widow and lives alone with her dog.   Patient has three children.   Patient is retired.  Patient has a college education.   Patient is right-handed.   Caffeine: Patient drinks 1 cup of coffee 2-3 times per week      Social Determinants of Health   Financial Resource Strain: Not on file  Food Insecurity: Not on file  Transportation Needs: Not on file  Physical Activity: Not on file  Stress: Not on file  Social Connections: Not on file  Intimate Partner Violence: Not on file    FAMILY HISTORY: Family History  Problem Relation Age of Onset   Dementia Father    Cancer Brother    Colon  cancer Maternal Grandmother    Other Son        salivary gland "tumor"    ALLERGIES:  is allergic to erythromycin and penicillins.  MEDICATIONS:  Current Outpatient Medications  Medication Sig Dispense Refill   clopidogrel (PLAVIX) 75 MG tablet Take 1 tablet by mouth daily.     estradiol (ESTRACE) 0.1 MG/GM vaginal cream Place 1 Applicatorful vaginally 2 (two) times a week. As needed     famotidine (PEPCID) 40 MG tablet Take 40 mg by mouth 2 (two) times daily.     levothyroxine (LEVOTHROID) 25 MCG tablet Take 1 tablet (25 mcg total) by mouth daily. 90 tablet 3   spironolactone (ALDACTONE) 50 MG tablet Take 50 mg by mouth daily.     UNABLE TO FIND Med Name: collagen peptides powder     No current facility-administered medications for this visit.    REVIEW OF SYSTEMS:   Constitutional: ( - ) fevers, ( - )  chills , ( - ) night sweats Eyes: ( - ) blurriness of vision, ( - ) double vision, ( - ) watery eyes Ears, nose, mouth, throat, and face: ( - ) mucositis, ( - ) sore throat Respiratory: ( - ) cough, ( - ) dyspnea, ( - ) wheezes Cardiovascular: ( - ) palpitation, ( - ) chest discomfort, ( - ) lower extremity swelling Gastrointestinal:  ( - ) nausea, ( - ) heartburn, ( - ) change in bowel habits Skin: ( - ) abnormal skin rashes Lymphatics: ( - ) new lymphadenopathy, ( - ) easy bruising Neurological: ( - ) numbness, ( - ) tingling, ( - ) new weaknesses Behavioral/Psych: ( - ) mood change, ( - ) new changes  All other systems were reviewed with the patient and are negative.  PHYSICAL EXAMINATION:  Vitals:   07/20/22 1116  BP: 120/63  Pulse: 63  Resp: 14  Temp: 97.9 F (36.6 C)  SpO2: 99%    Filed Weights   07/20/22 1116  Weight: 138 lb 8 oz (62.8 kg)     GENERAL: Well-appearing elderly Caucasian female, alert, no distress and comfortable SKIN: skin color, texture, turgor are normal, no rashes or significant lesions EYES: conjunctiva are pink and non-injected, sclera  clear.  LUNGS: clear to auscultation and percussion with normal breathing effort HEART: regular rate & rhythm and no murmurs and no lower extremity edema Musculoskeletal: no cyanosis of digits and no clubbing  PSYCH: alert & oriented x 3, fluent speech NEURO: no focal motor/sensory deficits  LABORATORY DATA:  I have reviewed the data as listed    Latest Ref Rng & Units 07/20/2022   10:21 AM 06/04/2022    2:19 PM 03/30/2022    8:20 AM  CBC  WBC 4.0 - 10.5 K/uL 4.3  5.7  3.3   Hemoglobin 12.0 - 15.0 g/dL 74.9  44.9  67.5   Hematocrit 36.0 -  46.0 % 41.0  42.0  40.1   Platelets 150 - 400 K/uL 226  224  229        Latest Ref Rng & Units 07/20/2022   10:21 AM 12/08/2021    2:16 PM 04/23/2021    2:23 AM  CMP  Glucose 70 - 99 mg/dL 86  98  756   BUN 8 - 23 mg/dL 17  17  23    Creatinine 0.44 - 1.00 mg/dL  4.33  2.95   Sodium 135 - 145 mmol/L 133  138  135   Potassium 3.5 - 5.1 mmol/L 4.2  4.0  3.9   Chloride 98 - 111 mmol/L 100  104  108   CO2 22 - 32 mmol/L 28  27  24    Calcium 8.9 - 10.3 mg/dL 9.5  9.6  8.1   Total Protein 6.5 - 8.1 g/dL 6.6  7.0  5.1   Total Bilirubin 0.3 - 1.2 mg/dL 0.6  0.4  0.6   Alkaline Phos 38 - 126 U/L 64  60  43   AST 15 - 41 U/L 22  23  18    ALT 0 - 44 U/L 17  22  17     RADIOGRAPHIC STUDIES: No results found.  ASSESSMENT & PLAN Linda Macdonald 81 y.o. female with medical history significant for hereditary hemochromatosis presents for a follow up visit.   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 --Baseline labs including hemoglobin A1c, TSH, and echocardiogram showed no evidence of damage to organs from iron overload.  MRI previously confirmed heavy iron deposition in the liver and spleen. -- Labs today show a hemoglobin 14.9, white blood cell count 4.3, MCV 89.5, and platelets of 226 -- HOLD phlebotomies while patient is in the 50-150 range. If >150 can  restart q 2 week phlebotomies with goal of ferritin <50 -- Lab check in 12 weeks to determine if additional phlebotomies are needed.  -- RTC in 6 months to see Dr. with interval 21-month labs..  Orders Placed This Encounter  Procedures   CBC with Differential (Cancer Center Only)    Standing Status:   Standing    Number of Occurrences:   4    Standing Expiration Date:   07/21/2023   Ferritin    Standing Status:   Standing    Number of Occurrences:   4    Standing Expiration Date:   07/21/2023    All questions were answered. The patient knows to call the clinic with any problems, questions or concerns.  I have spent a total of 25 minutes minutes of face-to-face and non-face-to-face time, preparing to see the patient, performing a medically appropriate examination, counseling and educating the patient, ordering tests/procedures, documenting clinical information in the electronic health record, and care coordination.   Leonides Schanz, MD Department of Hematology/Oncology [facility] Phone: [phone] Pager: [phone] Email: [email or web address]

## 2022-07-23 ENCOUNTER — Ambulatory Visit: Payer: Medicare Other | Admitting: Hematology and Oncology

## 2022-07-23 ENCOUNTER — Encounter: Payer: Self-pay | Admitting: Neurology

## 2022-07-23 ENCOUNTER — Ambulatory Visit: Payer: Medicare Other | Admitting: Neurology

## 2022-07-23 ENCOUNTER — Other Ambulatory Visit: Payer: Medicare Other

## 2022-07-23 VITALS — BP 119/59 | HR 67 | Ht 64.0 in | Wt 134.0 lb

## 2022-07-23 DIAGNOSIS — Z8673 Personal history of transient ischemic attack (TIA), and cerebral infarction without residual deficits: Secondary | ICD-10-CM | POA: Diagnosis not present

## 2022-07-23 DIAGNOSIS — R2990 Unspecified symptoms and signs involving the nervous system: Secondary | ICD-10-CM

## 2022-07-23 DIAGNOSIS — R5383 Other fatigue: Secondary | ICD-10-CM | POA: Diagnosis not present

## 2022-07-23 NOTE — Progress Notes (Signed)
[facility]    Provider:  Dr Linda Macdonald Referring Provider: Dois Davenport, MD Primary Care Physician:  Linda Davenport, MD  CC: wants to review MRI  07/23/2022; here for suspected stroke. From Dr. Hal Macdonald I reviewed referral notes, Dr. Hal Macdonald, c/o difficulty with balance anc coordination similar to prior lacunar infarct. 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  She was found to have hyponatremia at 130 and felt likely due to excessive water intake and high dose of bupropion. At that time her ldl was 93 and lipitor was added.   We know her well she has had these complaints for many years and extensive evals and multiple imaging to rule out other causes (like MRI cervical spine)  have added nothing more to her etiology.recent MRI also did not show anything new. She has more fatigue. She has not mentioned it to pcp, I recommend mentioning it to primary care and getting a thorough medical workup but I don;t see anything neurologic. She walks her dog and works out with a Psychologist, educational multiple times a week. Dizziness has improved, tremors resolved, no significant headaches,we discussed, reviewed images together, I answered all questions.  07/20/2022: cbc without anemia 12/18/2021: tsh normal, hgba1c 4.9 , sed 10 normal  06/04/2022: EXAM: personally reviewed and agree with findings MRI HEAD WITHOUT CONTRAST   TECHNIQUE: Multiplanar, multiecho pulse sequences of the brain and surrounding structures were obtained without intravenous contrast.   COMPARISON:  Brain MRI 12/08/2018.   FINDINGS: Brain:   No age advanced or lobar predominant parenchymal atrophy.   Redemonstrated prominent perivascular spaces within the inferior basal ganglia, bilaterally.   Subcentimeter T2 hyperintense focus within the left thalamus, more conspicuous as compared to the prior MRI of  02/28/2018 and also likely reflecting a prominent perivascular space.   No cortical encephalomalacia is identified. No significant cerebral white matter disease for age.   There is no acute infarct.   No evidence of an intracranial mass.   No chronic intracranial blood products.   No extra-axial fluid collection.   No midline shift.   Vascular: Maintained flow voids within the proximal large arterial vessels.   Skull and upper cervical spine: No focal suspicious marrow lesion.   Sinuses/Orbits: No mass or acute finding within the imaged orbits. Prior bilateral ocular lens replacement. Mild mucosal thickening versus small mucous retention cysts within the inferior right maxillary sinus. Trace mucosal thickening within the bilateral ethmoid sinuses.   IMPRESSION: No evidence of acute intracranial abnormality.   Prominent perivascular spaces within the left thalamus and inferior basal ganglia.   Otherwise unremarkable non-contrast MRI appearance of the brain for age.   Mild paranasal sinus disease, as described.  Patient complains of symptoms per HPI as well as the following symptoms: none . Pertinent negatives and positives per HPI. All others negative   06/28/2020, Linda Macdonald returns per request due to worsening of prior stroke symptoms over the past 2 to 3 weeks.  Called the office and spoke to on-call provider Dr. Frances Macdonald with concerns of worsening disequilibrium, word finding difficulty and dysphagia.  She was advised to proceed to ED for further evaluation.  Personally reviewed notes from [facility] from admission on 06/26/2020.  Full stroke work-up did not show any acute abnormalities.  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  She was found to have hyponatremia at 130 and  felt likely due to excessive water intake and high dose of bupropion.  She was advised to follow-up with PCP for repeat lab work.  LDL 93 and  recommended initiation of atorvastatin 10 mg daily.  On clopidogrel PTA and recommended continuation.  Blood pressure stable during admission.  Evaluated by therapies and recommended outpatient therapy at discharge.  Symptoms have been stable without worsening and possible slight improvement.  She has been monitoring fluid intake and is questioning decreasing bupropion dosage as high dose could potentially be causing side effects.  Prior stroke history with residual disequilibrium.  Review of prior office notes with complaints of disequilibrium, dizziness, tremor, headache and vertigo symptoms in 2019 as well as ongoing dysphagia and word finding difficulty.  Denies recent medication changes or recent illness.  Does report increased stressors and underlying depression without benefit of bupropion.  She does have follow-up with PCP next week.  Remains on clopidogrel without bleeding or bruising.  She is questioning need of atorvastatin as cholesterol levels stable.  Blood pressure today 106/59.  No further concerns.    Per review of hospitalization notes:  She presented to [facility] on [facility]/2020 with strokelike symptoms with difficulties finding words, slurred speech, dysphagia and gait disturbance.  Similar s/s 2 years prior.  Personally reviewed report from [facility] including labs and imaging.  Per review, history of prior stroke resulting in mild residual disequilibrium and experienced increased gait instability 1 week prior to evaluation after a fall landing in the grass hitting her head sustaining a hematoma.  The past 2 days prior to admission, experience word finding difficulties and dysphagia.  Denied headaches, visual disturbance, focal weakness or numbness.  CT head, CTA head/neck and MRI all unremarkable for acute abnormality with only evidence of small chronic bilateral basal ganglia infarctions.  LDL 93 initiate atorvastatin 10 mg daily.  A1c 5.5.  Vital stable  during admission.  UDS and UA negative.  Symptoms possibly related to mild hyponatremia at 130 versus TIA therefore recommended continuation of clopidogrel and atorvastatin for secondary stroke prevention.  SLP eval: Delayed oral transit and prolonged chewing and delayed throat clearing and mild to moderate oral dysphagia present.  Suspecting LPR with dysphonia and risk of microaspiration risk.  Delayed word finding present with some failed attempts for compensation.  Cognition WNL.  Recommended outpatient SLP  PT eval: Outpatient PT for balance/vestibular assessment and training and use of AD as needed for functional activities    History provided for reference purposes only Interval history 01/15/2019: Very lovely patient is here for a new problem, she was referred by her primary care for TIA.  We have seen her in the past for other neurologic symptoms including memory loss and work-up did not show any neurodegenerative disorder.  She is also complained of tremor and imbalance, she has been extensively evaluated with MRI of the brain and MRI of the cervical spine.  She continued with memory complaints however PET scan very sensitive for Alzheimer's dementia was normal.  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  Patient says she was seeing her pcp for a routine examination. She told her pcp she had headaches that felt outside her skull. which resolved. Carotid Dopplers were fine. She feels fine.   Interval history  08/19/2018: Patient is back for neurologic symptoms, tremor and imbalance, she was prescribed propranolol in the past and did not take it and also physical therapy vestibular therapy.  MRI of the brain and  MRI of the cervical spine show nothing acute. She has continued memory complaints, more short term memory issues.  Her grandfather and father had memory issues starting their 99s, unclear what diagnoses or type of dementia. She notes memory loss progression for years. More short-term memory. She can;t get words out, she knows the words but can;t get it out, she can be reticent in a conversation, she loses things at home, she misses bills but has never been good at that, wellbutrin helps, there has been a question of ADD. She is not aware of telling the same stories, still driving not getting lost but she has taken a wrong turn going to a familiar place.    Interval history 03/31/2018: Patient was evaluated for tremor and imbalance. She was prescribed propranolol and never took it. She was prescribed PT but did not go as she sees a trainer 2x a week and takes yoga.  MRI of the brain and MRI of the cervical spine showed nothing acute.  She was seen by her nurse practitioner with multiple neurologic complaints including equilibrium, dizziness and vertiginous symptoms.  Lacunar infarct seen on CT of the head was possibly an expanded virtual robins space, writing sample did not indicate essential tremor nor did spiral drawing. She is still having difficulty with episodes of "shakiness" and was recently in the ED with dizziness. Still having difficulty swallowing pills. Will order a swallowing eval. Will order propranolol 10mg  as needed for tremor. She has been seeing last fall. Sh eis going to PT today and asked them to do vestibular therapy.    Linda Macdonald is a 81 y.o. female here as a referral from Dr. 96 for a new consult. She feels her"equilibrium is off". She feels when she walks she has difficulty. She stubbed her foot on the carpet once. Started last summer, no inciting event or head trauma. No falls. Happens every day. Several times she has noticed a tremor. Usually when  holding something. She just keeps saying she is "off", she can;t explain it it is just that she sometimes takes a wrong step or an extra step to the right. No resting tremor. She wakes sometimes and can;t go back to sleep. Her first cousin has MS. No neuropathic disorders or neuromuscular disorders, no parkinson's disease in the family. Father had dementia in his mid 67s. 3x, not lately, her visual picture has "flipped up" for 30 seconds, no headache, no numbness, tingling, weakness. No symptoms in the feet, no numbness or tingling in the feet. No loss of smell or taste. She has chronic double vision treated with lenses. Grandmother had tremors. No other focal neurologic deficits, associated symptoms, inciting events or modifiable factors.  Reviewed notes, labs and imaging from outside physicians, which showed:  Personally reviewed Ct head and agree with the following:  TSH normal, b12 normal in the past  IMPRESSION: 1. No acute intracranial abnormality. 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  Reviewed notes.  Patient was seen in May 2015 for tingling dysesthesias.  Constant tingling sensation in her feet and is slowly ascending.  Unclear what initially started on.  Onset was 2 years prior she first noticed it.  It started affecting her sleep and more difficult to fall asleep.  Also started  noticing trouble with balance and fine motor skills especially during the dance class.  She developed some clamminess and some off-balance feeling.  She continued to be physically active.  Thyroid tests and vitamin D deficiencies were ruled out.  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  She was placed on a dental device for bruxism and snoring mild apnea.  She was tried on Requip.  Reviewed EMG nerve conduction study results and data, right upper extremity and both lower extremities showed no  evidence of a peripheral neuropathy or lumbosacral radiculopathy.    Review of Systems: Patient complains of symptoms per HPI as well as the following symptoms: insomnia. Pertinent negatives and positives per HPI. All others negative.   Social History   Socioeconomic History   Marital status: Widowed    Spouse name: Not on file   Number of children: 3   Years of education: College   Highest education level: Not on file  Occupational History   Not on file  Tobacco Use   Smoking status: Never   Smokeless tobacco: Never  Vaping Use   Vaping Use: Never used  Substance and Sexual Activity   Alcohol use: Yes    Comment: socially   Drug use: No   Sexual activity: Not on file  Other Topics Concern   Not on file  Social History Narrative   Patient is a widow and lives alone with her dog.   Patient has three children.   Patient is retired.   Patient has a college education.   Patient is right-handed.   Caffeine: Patient drinks 1 cup of coffee 2-3 times per week      Social Determinants of Health   Financial Resource Strain: Not on file  Food Insecurity: Not on file  Transportation Needs: Not on file  Physical Activity: Not on file  Stress: Not on file  Social Connections: Not on file  Intimate Partner Violence: Not on file    Family History  Problem Relation Age of Onset   Dementia Father    Cancer Brother    Colon cancer Maternal Grandmother    Other Son        salivary gland "tumor"    Past Medical History:  Diagnosis Date   Arthritis    Chronic kidney disease    Depression    Hair loss    d/t propranolol   hypothyroidism    Leukopenia 08/12/2012    Past Surgical History:  Procedure Laterality Date   CYSTOSCOPY/URETEROSCOPY/HOLMIUM LASER/STENT PLACEMENT Right 04/23/2021   Procedure: CYSTOSCOPY/URETEROSCOPY/HOLMIUM LASER/STENT PLACEMENT;  Surgeon: Rene Paci, MD;  Location: [facility];  Service: Urology;  Laterality: Right;   EXTRACORPOREAL  SHOCK WAVE LITHOTRIPSY Left 04/25/2017   Procedure: LEFT EXTRACORPOREAL SHOCK WAVE LITHOTRIPSY (ESWL);  Surgeon: Heloise Purpura, MD;  Location: [facility];  Service: Urology;  Laterality: Left;   KNEE ARTHROSCOPY Left 2oyrs ago   L knee x2/ R knee x1   LITHOTRIPSY  2012   difficulty voiding after procedure, d/c home with foley per pt   TUBAL LIGATION     WISDOM TOOTH EXTRACTION     teenager    Current Outpatient Medications  Medication Sig Dispense Refill   B Complex Vitamins (B COMPLEX PO) Take by mouth.     clopidogrel (PLAVIX) 75 MG tablet Take 1 tablet by mouth daily.     estradiol (ESTRACE) 0.1 MG/GM vaginal cream Place 1 Applicatorful vaginally  2 (two) times a week. As needed     famotidine (PEPCID) 40 MG tablet Take 40 mg by mouth 2 (two) times daily.     levothyroxine (LEVOTHROID) 25 MCG tablet Take 1 tablet (25 mcg total) by mouth daily. 90 tablet 3   spironolactone (ALDACTONE) 50 MG tablet Take 50 mg by mouth daily.     UNABLE TO FIND Med Name: collagen peptides powder     No current facility-administered medications for this visit.    Allergies as of 07/23/2022 - Review Complete 07/23/2022  Allergen Reaction Noted   Erythromycin Swelling 11/13/2007   Penicillins Swelling and Rash 11/13/2007    Vitals: Today's Vitals   07/23/22 1422  BP: (!) 119/59  Pulse: 67  Weight: 134 lb (60.8 kg)  Height: 5\' 4"  (1.626 m)   Body mass index is 23 kg/m.  Physical exam: Exam: Gen: NAD, conversant, well nourised, well groomed                     CV: RRR, no MRG. No Carotid Bruits. No peripheral edema, warm, nontender Eyes: Conjunctivae clear without exudates or hemorrhage  Neuro: Detailed Neurologic Exam  Speech:    Speech is normal; fluent and spontaneous with normal comprehension.  Cognition:    The patient is oriented to person, place, and time;     recent and remote memory intact;     language fluent;     normal attention, concentration,     fund of knowledge Cranial  Nerves:    The pupils are equal, round, and reactive to light.  Visual fields are full to finger confrontation. Extraocular movements are intact. Trigeminal sensation is intact and the muscles of mastication are normal. The face is symmetric. The palate elevates in the midline. Hearing intact. Voice is normal. Shoulder shrug is normal. The tongue has normal motion without fasciculations.   Coordination:    Normal finger to nose and heel to shin.  Gait:    Heel-toe and tandem gait backwards and forwards are normal.   Motor Observation:    No asymmetry, no atrophy, and no involuntary movements noted. Tone:    Normal muscle tone.    Posture:    Posture is normal. normal erect    Strength:    Strength is V/V in the upper and lower limbs.      Sensation: intact to LT     Reflex Exam:  DTR's:    Deep tendon reflexes in the upper and lower extremities are normal bilaterally.   Toes:    The toes are downgoing bilaterally.   Clonus:    Clonus is absent.      Assessment/Plan:  Linda Macdonald is a 81 y.o. female being seen today for follow up, neurologic exam is normal and she looks great today.   Longstanding complaint of dizziness, vertigo, tremors and headache which have all resolved per patient today, multiple imaging over the last several years without any new findings, on plavix for stroke prevention, last imaging 05/2022 without acute findings   Full work-up at outside hospital negative for acute stroke in 2021. Most recent MRI 2023 also without acute findings (perivascular spaces benign)  Some fatigue, will check tsh follow up with Dr. Hal Hopeichter  Continue clopidogrel for secondary stroke prevention.  Patient hesitant to start atorvastatin and discussed indication with prior stroke history.  She plans on having repeat lipid panel at follow-up visit with PCP and will further discuss need of statin therapy.  Follow-up on an as-needed basis  Orders Placed This Encounter   Procedures   TSH Rfx on Abnormal to Free T4   I spent 30 minutes of face-to-face and non-face-to-face time with patient.  This included previsit chart review, lab review, study review, order entry, electronic health record documentation, review of outside hospital notes, discussion regarding symptom complaints and full work-up, possible etiologies and answered all questions to patient satisfaction  Naomie Dean [facility] [address] [place], [place] [address]  Phone [phone] Fax [phone]

## 2022-10-24 ENCOUNTER — Inpatient Hospital Stay: Payer: Medicare Other | Attending: Physician Assistant

## 2022-10-24 ENCOUNTER — Other Ambulatory Visit: Payer: Self-pay

## 2022-10-24 ENCOUNTER — Encounter: Payer: Self-pay | Admitting: Physician Assistant

## 2022-10-24 LAB — CBC WITH DIFFERENTIAL (CANCER CENTER ONLY)
Abs Immature Granulocytes: 0.01 10*3/uL (ref 0.00–0.07)
Basophils Absolute: 0 10*3/uL (ref 0.0–0.1)
Basophils Relative: 1 %
Eosinophils Absolute: 0.1 10*3/uL (ref 0.0–0.5)
Eosinophils Relative: 2 %
HCT: 43.9 % (ref 36.0–46.0)
Hemoglobin: 15.3 g/dL — ABNORMAL HIGH (ref 12.0–15.0)
Immature Granulocytes: 0 %
Lymphocytes Relative: 31 %
Lymphs Abs: 1.3 10*3/uL (ref 0.7–4.0)
MCH: 33 pg (ref 26.0–34.0)
MCHC: 34.9 g/dL (ref 30.0–36.0)
MCV: 94.6 fL (ref 80.0–100.0)
Monocytes Absolute: 0.4 10*3/uL (ref 0.1–1.0)
Monocytes Relative: 10 %
Neutro Abs: 2.3 10*3/uL (ref 1.7–7.7)
Neutrophils Relative %: 56 %
Platelet Count: 233 10*3/uL (ref 150–400)
RBC: 4.64 MIL/uL (ref 3.87–5.11)
RDW: 11.9 % (ref 11.5–15.5)
WBC Count: 4.2 10*3/uL (ref 4.0–10.5)
nRBC: 0 % (ref 0.0–0.2)

## 2022-10-24 LAB — FERRITIN: Ferritin: 33 ng/mL (ref 11–307)

## 2022-10-25 ENCOUNTER — Telehealth: Payer: Self-pay | Admitting: *Deleted

## 2022-10-25 NOTE — Telephone Encounter (Signed)
TCT patient regarding recent lab results. Spoke with her. Advised that her Ferritin is on [employer] @ 33. Her other labs are normal. Advised we will see her back in February 2024. She voiced understanding and is very pleased with the care she receives here.

## 2022-10-25 NOTE — Telephone Encounter (Signed)
-----   Message from Jaci Standard, MD sent at 10/25/2022  9:41 AM EST ----- Please let Mrs. Lodge know that her ferritin is on [employer], currently at 33. We will see her back in Feb 2024.  ----- Message ----- From: Leory Plowman, Lab [facility] [employer] Sent: 10/24/2022   8:17 AM EST To: Jaci Standard, MD

## 2023-01-24 ENCOUNTER — Telehealth: Payer: Self-pay | Admitting: Hematology and Oncology

## 2023-01-24 NOTE — Telephone Encounter (Signed)
Per 2/15 IB reached out to patient to reschedule, Linda Macdonald had no availability scheduled patient with Linda Macdonald w labs before, patient aware and confirmed

## 2023-01-28 ENCOUNTER — Inpatient Hospital Stay: Payer: Medicare Other

## 2023-01-28 ENCOUNTER — Inpatient Hospital Stay: Payer: Medicare Other | Admitting: Hematology and Oncology

## 2023-02-05 IMAGING — MR MR ABDOMEN WO/W CM MRCP
12 of 20 series · 24 of 48 positions shown · IV contrast (multihance)
Comparison: MRI Thursday July, 2019.

CLINICAL DATA: A 80-year-old female presents for follow-up of a
pancreatic lesion

EXAM:
MRI ABDOMEN WITHOUT AND WITH CONTRAST (INCLUDING MRCP)
TECHNIQUE: Multiplanar multisequence MR imaging of the abdomen was performed
both before and after the administration of intravenous contrast.
Heavily T2-weighted images of the biliary and pancreatic ducts were
obtained, and three-dimensional MRCP images were rendered by post
processing.
CONTRAST:  12mL MULTIHANCE GADOBENATE DIMEGLUMINE 529 MG/ML IV SOLN

[Series 3: cor haste · coronal · 5.0mm · 0.74mm/px · 2 of 38 slices shown]
[im 1/38]
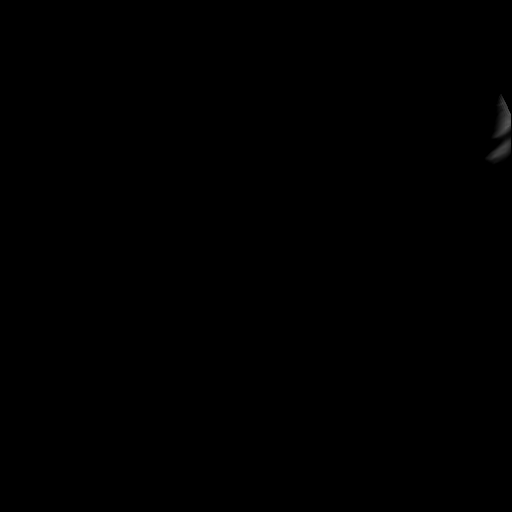
[im 38/38]
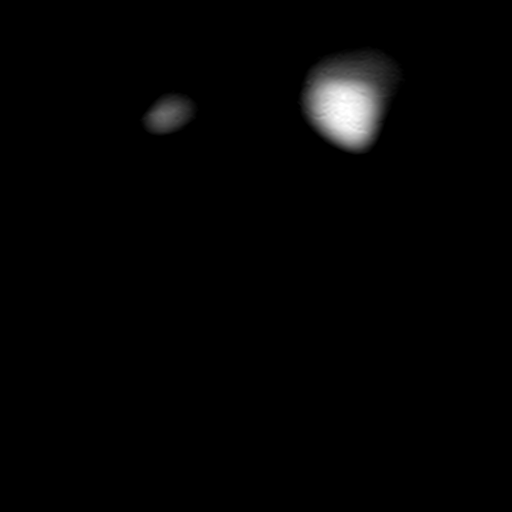

[Series 4: axial haste · axial · 6.0mm · 0.78mm/px · z∈[-152,+72]mm · 2 of 35 slices shown]
[im 1/35]
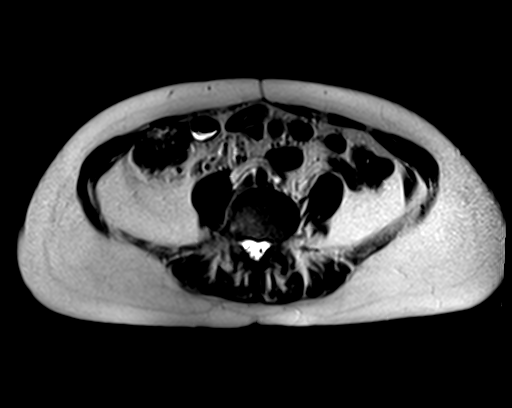
[im 35/35]
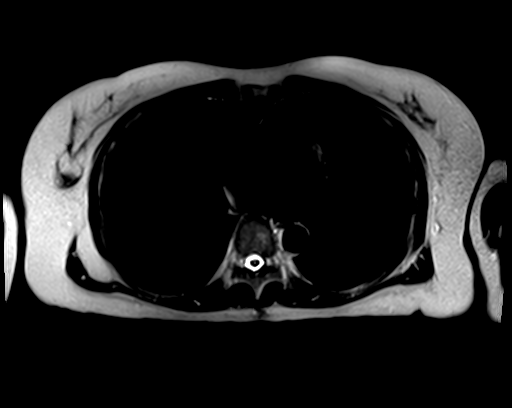

[Series 7: bSSFP · coronal · 5.0mm · 0.78mm/px · 1 of 34 slices shown (1 of 2)]
[im 1/34]
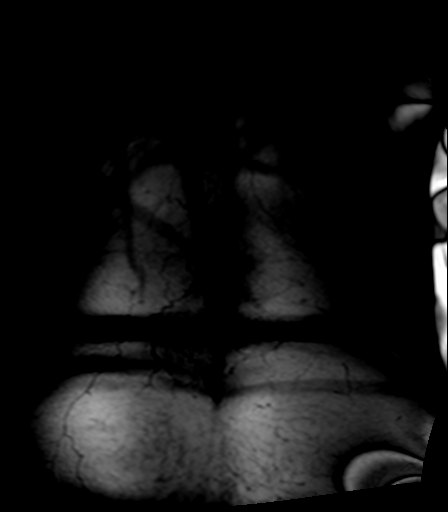

[Series 8: T2 · coronal · 3.0mm · 0.70mm/px · 2 of 52 slices shown]
[im 1/52]
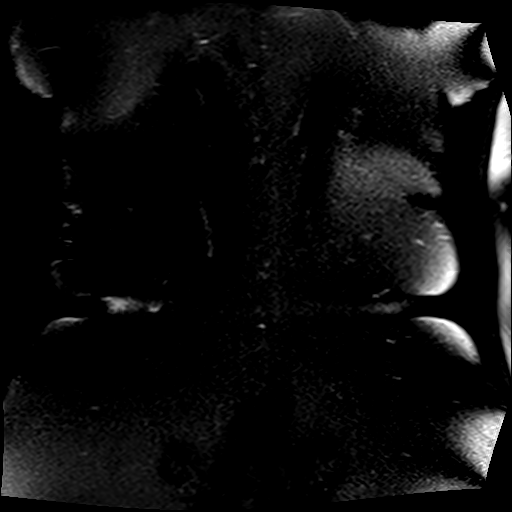
[im 52/52]
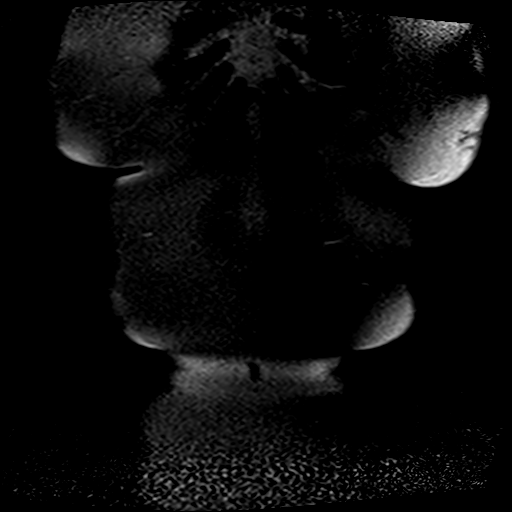

[Series 9: T1 · axial · 6.0mm · 0.74mm/px · z∈[-132,+59]mm · 2 of 60 slices shown]
[im 1/60]
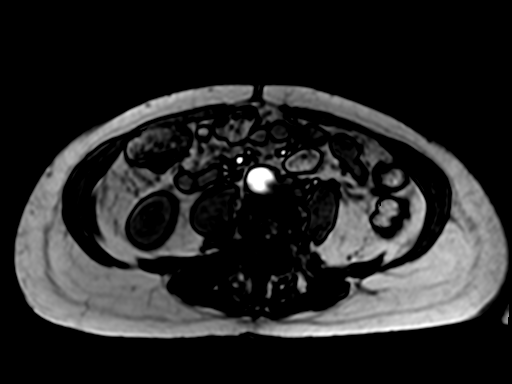
[im 60/60]
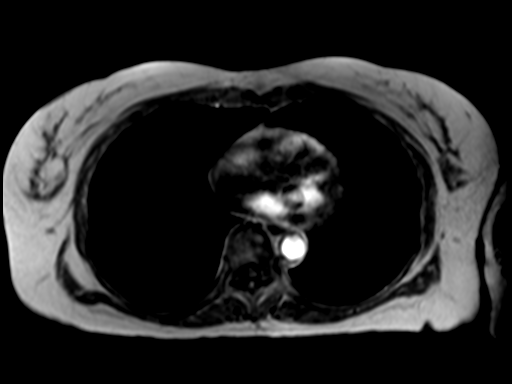

[Series 10: bSSFP · axial · 4.0mm · 0.74mm/px · z∈[-117,+59]mm · 2 of 45 slices shown (2 of 2)]
[im 1/45]
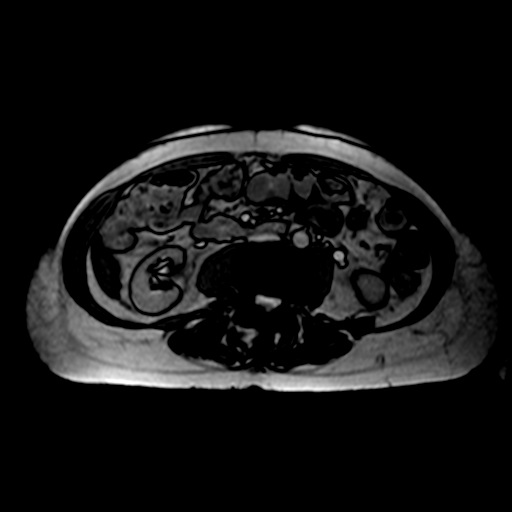
[im 45/45]
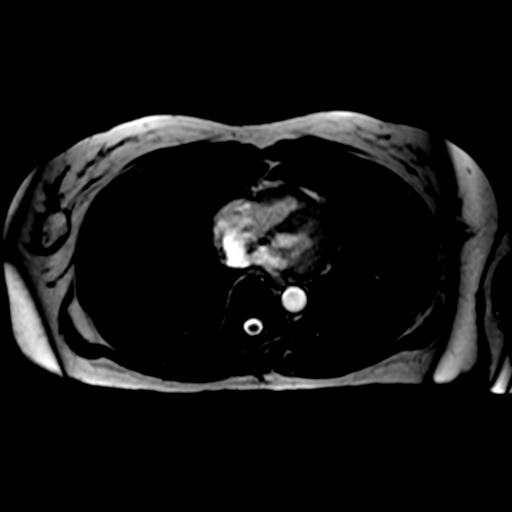

[Series 14: MRCP · coronal · 0.99mm/px · 1 of 8 slices shown]
[im 1/8]
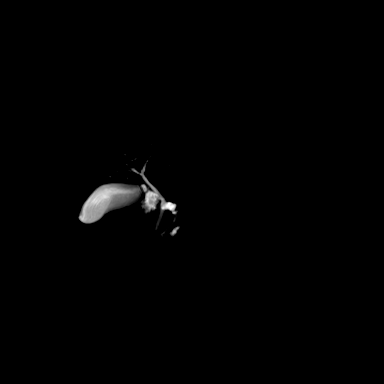

[Series 16: [id number]_trig · axial · 6.0mm · 1.98mm/px · z∈[-126,+83]mm · 3 of 90 slices shown]
[im 1/90]
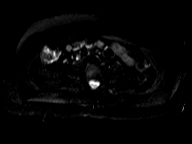
[im 45/90]
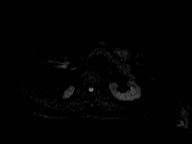
[im 90/90]
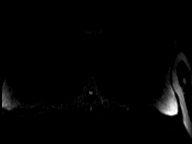

[Series 17: [id number]_trig_adc · axial · 6.0mm · 1.98mm/px · 1 of 30 slices shown]
[im 1/30]
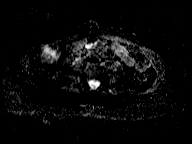

[Series 18: T1 dynamic · axial · non-contrast · 2.5mm · 0.74mm/px · z∈[-155,+63]mm · 3 of 88 slices shown]
[im 1/88]
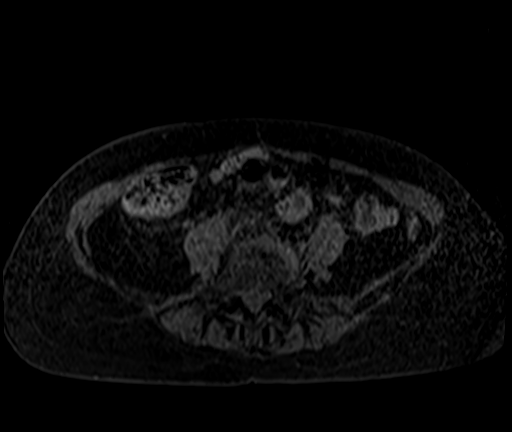
[im 44/88]
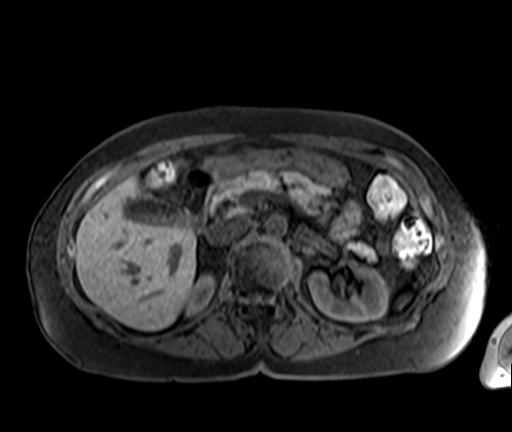
[im 88/88]
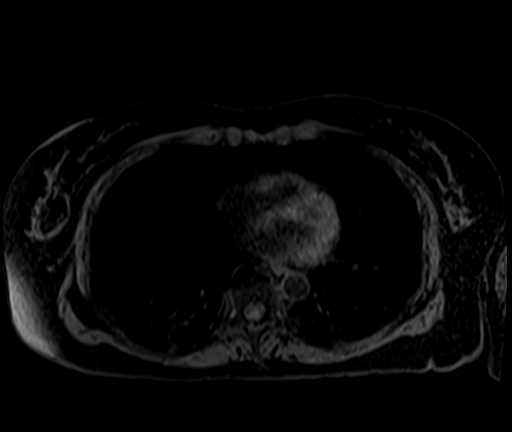

[Series 19: T1 dynamic post-contrast · axial · 2.5mm · 0.74mm/px · z∈[-155,+63]mm · 3 of 88 slices shown (1 of 2)]
[im 1/88]
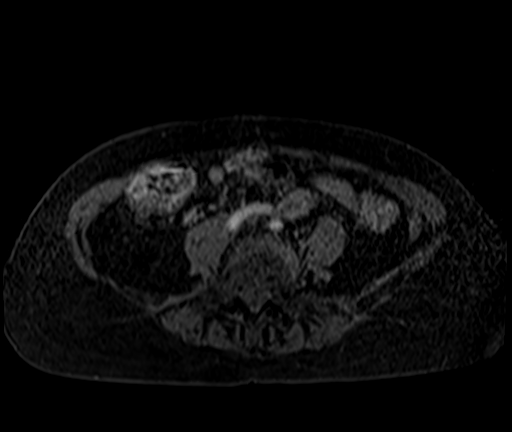
[im 44/88]
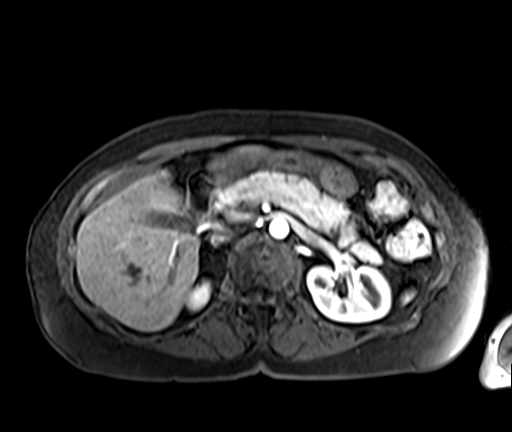
[im 88/88]
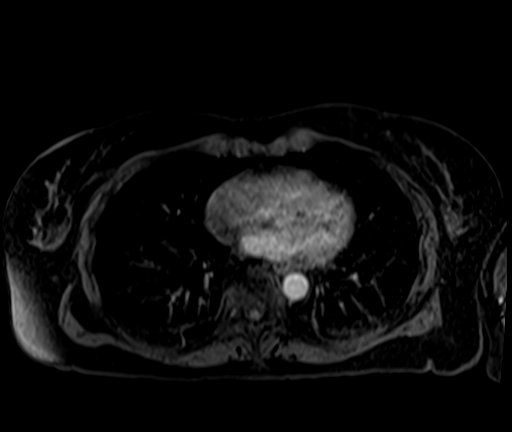

[Series 20: T1 dynamic post-contrast · axial · 2.5mm · 0.74mm/px · z∈[-155,-47]mm · 2 of 88 slices shown (2 of 2)]
[im 1/88]
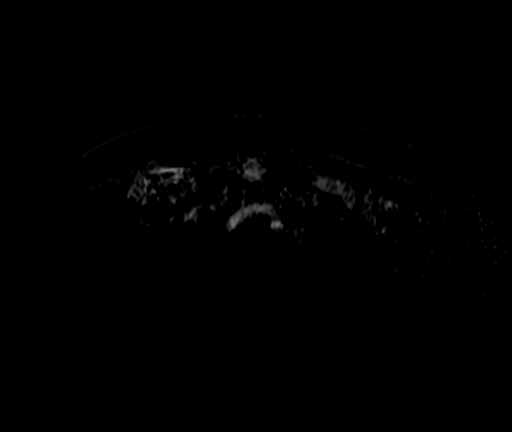
[im 44/88]
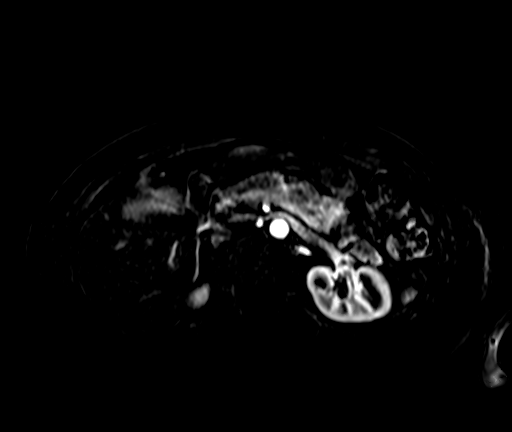

[24 of 48 positions shown; findings below may reference images not displayed]

FINDINGS: Lower chest: Incidental imaging of the lung bases on MRI is
unremarkable.

Hepatobiliary: Profound hepatic iron deposition, associated splenic
iron deposition is noted as well. Findings are unchanged compared to
most recent MR comparison from Thursday July, 2019.

No focal, suspicious hepatic lesion with numerous hepatic cysts or
biliary hamartomas a without change. Sludge in the gallbladder.

Pancreas: Cystic pancreatic lesions largest approximately 15 mm as
compared to 14 mm greatest axial dimension, no signs of enhancement.
Evidence of main duct communication but without main duct dilation.

Inferiorly in the head of the pancreas there is also a second area
of cystic change that is un changed as well measuring approximately
7 mm on image 58 of series 21 without internal enhancement or
adjacent ductal dilation.

In the inferior aspect of the uncinate process a third area also
shows no interval change measuring 5 mm (image 34/8)

No signs of pancreatic inflammation. Intrinsic T1 signal in the
pancreas is preserved.

Spleen: Stable benign-appearing splenic lesion likely a small
septated cyst.

Adrenals/Urinary Tract: Adrenal glands are normal. Symmetric renal
enhancement. Small cyst in the posterior aspect of the LEFT kidney.
No suspicious renal lesion or hydronephrosis. Patulous RIGHT
extrarenal pelvis is actually less distended than on previous
imaging.

Stomach/Bowel: Unremarkable to the extent evaluated on abdominal
MRI.

Vascular/Lymphatic: No pathologically enlarged lymph nodes
identified. No abdominal aortic aneurysm demonstrated.

Other:  No ascites.

Musculoskeletal: No suspicious bone lesions identified.
IMPRESSION: Profound hepatic iron deposition, associated splenic iron
deposition, unchanged compared to most recent MR comparison from
Thursday July, 2019.

Cystic pancreatic lesions largest communicates with main pancreatic
duct likely small intraductal papillary mucinous neoplasm. Lesions
within 1 mm of previous size without current high-risk features. Two
year follow-up MRI/MRCP is suggested based on current guidelines.

Sludge in the gallbladder.

## 2023-02-06 ENCOUNTER — Other Ambulatory Visit: Payer: Self-pay | Admitting: Physician Assistant

## 2023-02-06 ENCOUNTER — Inpatient Hospital Stay (HOSPITAL_BASED_OUTPATIENT_CLINIC_OR_DEPARTMENT_OTHER): Payer: Medicare Other | Admitting: Physician Assistant

## 2023-02-06 ENCOUNTER — Inpatient Hospital Stay: Payer: Medicare Other | Attending: Hematology and Oncology

## 2023-02-06 DIAGNOSIS — Z800 Family history of malignant neoplasm of digestive organs: Secondary | ICD-10-CM | POA: Insufficient documentation

## 2023-02-06 DIAGNOSIS — R21 Rash and other nonspecific skin eruption: Secondary | ICD-10-CM | POA: Insufficient documentation

## 2023-02-06 DIAGNOSIS — N189 Chronic kidney disease, unspecified: Secondary | ICD-10-CM | POA: Insufficient documentation

## 2023-02-06 LAB — CBC WITH DIFFERENTIAL (CANCER CENTER ONLY)
Abs Immature Granulocytes: 0.01 10*3/uL (ref 0.00–0.07)
Basophils Absolute: 0 10*3/uL (ref 0.0–0.1)
Basophils Relative: 1 %
Eosinophils Absolute: 0.3 10*3/uL (ref 0.0–0.5)
Eosinophils Relative: 7 %
HCT: 41.9 % (ref 36.0–46.0)
Hemoglobin: 15 g/dL (ref 12.0–15.0)
Immature Granulocytes: 0 %
Lymphocytes Relative: 26 %
Lymphs Abs: 1.1 10*3/uL (ref 0.7–4.0)
MCH: 33.3 pg (ref 26.0–34.0)
MCHC: 35.8 g/dL (ref 30.0–36.0)
MCV: 92.9 fL (ref 80.0–100.0)
Monocytes Absolute: 0.3 10*3/uL (ref 0.1–1.0)
Monocytes Relative: 7 %
Neutro Abs: 2.6 10*3/uL (ref 1.7–7.7)
Neutrophils Relative %: 59 %
Platelet Count: 227 10*3/uL (ref 150–400)
RBC: 4.51 MIL/uL (ref 3.87–5.11)
RDW: 11.9 % (ref 11.5–15.5)
WBC Count: 4.4 10*3/uL (ref 4.0–10.5)
nRBC: 0 % (ref 0.0–0.2)

## 2023-02-06 LAB — FERRITIN: Ferritin: 41 ng/mL (ref 11–307)

## 2023-02-06 NOTE — Progress Notes (Signed)
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 Testing showed ferritin 173 and genetic testing consistent with  Homozygous C282Y 12/22/2021: Started phlebotomy q 2 weeks.  02/28/2022: last phlebotomy performed.   Interval History:  Linda Macdonald 82 y.o. female with medical history significant for hereditary hemochromatosis presents for a follow up visit. Patient was last seen on 07/20/2022. In the interim since the last visit she has not required any further therapeutic phlebotomy.  On exam today Linda Macdonald reports she is feeling well with stable energy and appetite.  She has noticed a pruritic rash on her face and she is scheduled to have an evaluation with a dermatologist.  She denies any nausea, vomiting or abdominal pain.  Her bowel habits are unchanged without recurrent episodes of diarrhea or constipation.  She denies easy bruising or signs of active bleeding. She denies fevers, chills, night sweats, shortness of breath, chest pain or cough.  She has no other complaints.  Rest of the 10 point ROS is below.   MEDICAL HISTORY:  Past Medical History:  Diagnosis Date   Arthritis    Chronic kidney disease    Depression    Hair loss    d/t propranolol   hypothyroidism    Leukopenia 08/12/2012    SURGICAL HISTORY: Past Surgical History:  Procedure Laterality Date   CYSTOSCOPY/URETEROSCOPY/HOLMIUM LASER/STENT PLACEMENT Right 04/23/2021   Procedure: CYSTOSCOPY/URETEROSCOPY/HOLMIUM LASER/STENT PLACEMENT;  Surgeon:  Ceasar Mons, MD;  Location: [facility];  Service: Urology;  Laterality: Right;   EXTRACORPOREAL SHOCK WAVE LITHOTRIPSY Left 04/25/2017   Procedure: LEFT EXTRACORPOREAL SHOCK WAVE LITHOTRIPSY (ESWL);  Surgeon: Raynelle Bring, MD;  Location: [facility];  Service: Urology;  Laterality: Left;   KNEE ARTHROSCOPY Left 2oyrs ago   L knee x2/ R knee x1   LITHOTRIPSY  2012   difficulty voiding after procedure, d/c home with foley per pt   TUBAL LIGATION     WISDOM TOOTH EXTRACTION     teenager    SOCIAL HISTORY: Social History   Socioeconomic History   Marital status: Widowed    Spouse name: Not on file   Number of children: 3   Years of education: College   Highest education level: Not on file  Occupational History   Not on file  Tobacco Use   Smoking status: Never   Smokeless tobacco: Never  Vaping Use   Vaping Use: Never used  Substance and Sexual Activity   Alcohol use: Yes    Comment: socially   Drug use: No   Sexual activity: Not on file  Other Topics Concern   Not on file  Social History Narrative   Patient is a widow and lives alone with her dog.   Patient has three children.   Patient is retired.   Patient has a college education.   Patient is right-handed.   Caffeine: Patient drinks 1 cup of coffee 2-3 times per week      Social Determinants of Health   Financial Resource Strain: Not on file  Food Insecurity: Not on file  Transportation Needs: Not on file  Physical Activity: Not on file  Stress: Not  on file  Social Connections: Not on file  Intimate Partner Violence: Not on file    FAMILY HISTORY: Family History  Problem Relation Age of Onset   Dementia Father    Cancer Brother    Colon cancer Maternal Grandmother    Other Son        salivary gland "tumor"    ALLERGIES:  is allergic to erythromycin and penicillins.  MEDICATIONS:  Current Outpatient Medications  Medication Sig Dispense Refill   B Complex Vitamins (B COMPLEX PO) Take by mouth.      clopidogrel (PLAVIX) 75 MG tablet Take 1 tablet by mouth daily.     estradiol (ESTRACE) 0.1 MG/GM vaginal cream Place 1 Applicatorful vaginally 2 (two) times a week. As needed     famotidine (PEPCID) 40 MG tablet Take 40 mg by mouth 2 (two) times daily.     levothyroxine (LEVOTHROID) 25 MCG tablet Take 1 tablet (25 mcg total) by mouth daily. 90 tablet 3   spironolactone (ALDACTONE) 50 MG tablet Take 50 mg by mouth daily.     UNABLE TO FIND Med Name: collagen peptides powder     Vitamin D, Ergocalciferol, (DRISDOL) 1.25 MG (50000 UNIT) CAPS capsule Take 50,000 Units by mouth every 7 (seven) days.     No current facility-administered medications for this visit.    REVIEW OF SYSTEMS:   Constitutional: ( - ) fevers, ( - )  chills , ( - ) night sweats Eyes: ( - ) blurriness of vision, ( - ) double vision, ( - ) watery eyes Ears, nose, mouth, throat, and face: ( - ) mucositis, ( - ) sore throat Respiratory: ( - ) cough, ( - ) dyspnea, ( - ) wheezes Cardiovascular: ( - ) palpitation, ( - ) chest discomfort, ( - ) lower extremity swelling Gastrointestinal:  ( - ) nausea, ( - ) heartburn, ( - ) change in bowel habits Skin: ( - ) abnormal skin rashes Lymphatics: ( - ) new lymphadenopathy, ( - ) easy bruising Neurological: ( - ) numbness, ( - ) tingling, ( - ) new weaknesses Behavioral/Psych: ( - ) mood change, ( - ) new changes  All other systems were reviewed with the patient and are negative.  PHYSICAL EXAMINATION:  Vitals:   02/06/23 0817  BP: (!) 117/58  Pulse: 76  Resp: 14  Temp: 97.6 F (36.4 C)  SpO2: 99%    Filed Weights   02/06/23 0817  Weight: 139 lb (63 kg)     GENERAL: Well-appearing elderly Caucasian female, alert, no distress and comfortable SKIN: skin color, texture, turgor are normal.  Erythematous rash noted on left cheek and chin. EYES: conjunctiva are pink and non-injected, sclera clear.  LUNGS: clear to auscultation and percussion with normal breathing  effort HEART: regular rate & rhythm and no murmurs and no lower extremity edema Musculoskeletal: no cyanosis of digits and no clubbing  PSYCH: alert & oriented x 3, fluent speech NEURO: no focal motor/sensory deficits  LABORATORY DATA:  I have reviewed the data as listed    Latest Ref Rng & Units 02/06/2023    8:01 AM 10/24/2022    8:09 AM 07/20/2022   10:21 AM  CBC  WBC 4.0 - 10.5 K/uL 4.4  4.2  4.3   Hemoglobin 12.0 - 15.0 g/dL 15.0  15.3  14.9   Hematocrit 36.0 - 46.0 % 41.9  43.9  41.0   Platelets 150 - 400 K/uL 227  233  226  Latest Ref Rng & Units 07/20/2022   10:21 AM 12/08/2021    2:16 PM 04/23/2021    2:23 AM  CMP  Glucose 70 - 99 mg/dL 86  98  105   BUN 8 - 23 mg/dL '17  17  23   '$ Creatinine 0.44 - 1.00 mg/dL 0.93  0.85  0.86   Sodium 135 - 145 mmol/L 133  138  135   Potassium 3.5 - 5.1 mmol/L 4.2  4.0  3.9   Chloride 98 - 111 mmol/L 100  104  108   CO2 22 - 32 mmol/L '28  27  24   '$ Calcium 8.9 - 10.3 mg/dL 9.5  9.6  8.1   Total Protein 6.5 - 8.1 g/dL 6.6  7.0  5.1   Total Bilirubin 0.3 - 1.2 mg/dL 0.6  0.4  0.6   Alkaline Phos 38 - 126 U/L 64  60  43   AST 15 - 41 U/L '22  23  18   '$ ALT 0 - 44 U/L '17  22  17    '$ RADIOGRAPHIC STUDIES: No results found.  ASSESSMENT & PLAN Linda Macdonald 82 y.o. female with medical history significant for hereditary hemochromatosis presents for a follow up visit.   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 --Baseline labs including hemoglobin A1c, TSH, and echocardiogram showed no evidence of damage to organs from iron overload.  MRI previously confirmed heavy iron deposition in the liver and spleen. -- Labs today show a hemoglobin 15.0, white blood cell count 4.3, MCV 92.9, and platelets of 227. Ferritin level is 41. -- HOLD phlebotomies while patient is in the 50-150 range. If >150 can restart q 2 week phlebotomies with goal of ferritin  <50 -- RTC in 6 months for labs and follow with interval 23-monthlabs.  No orders of the defined types were placed in this encounter.   All questions were answered. The patient knows to call the clinic with any problems, questions or concerns.  I have spent a total of 25 minutes minutes of face-to-face and non-face-to-face time, preparing to see the patient, performing a medically appropriate examination, counseling and educating the patient, ordering tests/procedures, documenting clinical information in the electronic health record, and care coordination.   IDede QueryPA-C Dept of H[place] and [place] [facility]Phone: [phone]

## 2023-03-24 ENCOUNTER — Emergency Department (HOSPITAL_COMMUNITY): Payer: Medicare Other

## 2023-03-24 ENCOUNTER — Emergency Department (HOSPITAL_COMMUNITY)
Admission: EM | Admit: 2023-03-24 | Discharge: 2023-03-24 | Disposition: A | Discharge status: Home / Self Care | Payer: Medicare Other | Attending: Emergency Medicine | Admitting: Emergency Medicine

## 2023-03-24 ENCOUNTER — Other Ambulatory Visit: Payer: Self-pay

## 2023-03-24 ENCOUNTER — Encounter (HOSPITAL_COMMUNITY): Payer: Self-pay

## 2023-03-24 DIAGNOSIS — Y92007 Garden or yard of unspecified non-institutional (private) residence as the place of occurrence of the external cause: Secondary | ICD-10-CM | POA: Insufficient documentation

## 2023-03-24 DIAGNOSIS — Z23 Encounter for immunization: Secondary | ICD-10-CM | POA: Diagnosis not present

## 2023-03-24 DIAGNOSIS — S0101XA Laceration without foreign body of scalp, initial encounter: Secondary | ICD-10-CM | POA: Insufficient documentation

## 2023-03-24 DIAGNOSIS — Z8673 Personal history of transient ischemic attack (TIA), and cerebral infarction without residual deficits: Secondary | ICD-10-CM | POA: Diagnosis not present

## 2023-03-24 DIAGNOSIS — Z7901 Long term (current) use of anticoagulants: Secondary | ICD-10-CM | POA: Diagnosis not present

## 2023-03-24 DIAGNOSIS — W01198A Fall on same level from slipping, tripping and stumbling with subsequent striking against other object, initial encounter: Secondary | ICD-10-CM | POA: Diagnosis not present

## 2023-03-24 LAB — CBC WITH DIFFERENTIAL/PLATELET
Abs Immature Granulocytes: 0.03 10*3/uL (ref 0.00–0.07)
Basophils Absolute: 0 10*3/uL (ref 0.0–0.1)
Basophils Relative: 1 %
Eosinophils Absolute: 0.1 10*3/uL (ref 0.0–0.5)
Eosinophils Relative: 1 %
HCT: 41.3 % (ref 36.0–46.0)
Hemoglobin: 14.3 g/dL (ref 12.0–15.0)
Immature Granulocytes: 1 %
Lymphocytes Relative: 12 %
Lymphs Abs: 0.8 10*3/uL (ref 0.7–4.0)
MCH: 33.8 pg (ref 26.0–34.0)
MCHC: 34.6 g/dL (ref 30.0–36.0)
MCV: 97.6 fL (ref 80.0–100.0)
Monocytes Absolute: 0.4 10*3/uL (ref 0.1–1.0)
Monocytes Relative: 6 %
Neutro Abs: 5 10*3/uL (ref 1.7–7.7)
Neutrophils Relative %: 79 %
Platelets: 200 10*3/uL (ref 150–400)
RBC: 4.23 MIL/uL (ref 3.87–5.11)
RDW: 12.2 % (ref 11.5–15.5)
WBC: 6.3 10*3/uL (ref 4.0–10.5)
nRBC: 0 % (ref 0.0–0.2)

## 2023-03-24 LAB — APTT: aPTT: 25 seconds (ref 24–36)

## 2023-03-24 LAB — COMPREHENSIVE METABOLIC PANEL
ALT: 17 U/L (ref 0–44)
AST: 21 U/L (ref 15–41)
Albumin: 3.7 g/dL (ref 3.5–5.0)
Alkaline Phosphatase: 53 U/L (ref 38–126)
Anion gap: 11 (ref 5–15)
BUN: 17 mg/dL (ref 8–23)
CO2: 20 mmol/L — ABNORMAL LOW (ref 22–32)
Calcium: 9.2 mg/dL (ref 8.9–10.3)
Chloride: 101 mmol/L (ref 98–111)
Creatinine, Ser: 1.03 mg/dL — ABNORMAL HIGH (ref 0.44–1.00)
GFR, Estimated: 55 mL/min — ABNORMAL LOW (ref >60)
Glucose, Bld: 103 mg/dL — ABNORMAL HIGH (ref 70–99)
Potassium: 4.5 mmol/L (ref 3.5–5.1)
Sodium: 132 mmol/L — ABNORMAL LOW (ref 135–145)
Total Bilirubin: 0.6 mg/dL (ref 0.3–1.2)
Total Protein: 5.9 g/dL — ABNORMAL LOW (ref 6.5–8.1)

## 2023-03-24 LAB — PROTIME-INR
INR: 1 (ref 0.8–1.2)
Prothrombin Time: 13.3 seconds (ref 11.4–15.2)

## 2023-03-24 LAB — ABO/RH: ABO/RH(D): A NEG

## 2023-03-24 MED ORDER — MORPHINE SULFATE (PF) 2 MG/ML IV SOLN
2.0000 mg | Freq: Once | INTRAVENOUS | Status: AC
  Administered 2023-03-24: 2 mg via INTRAVENOUS
  Filled 2023-03-24: qty 1

## 2023-03-24 MED ORDER — LIDOCAINE-EPINEPHRINE-TETRACAINE (LET) TOPICAL GEL
3.0000 mL | Freq: Once | TOPICAL | Status: AC
  Administered 2023-03-24: 3 mL via TOPICAL
  Filled 2023-03-24: qty 3

## 2023-03-24 MED ORDER — TETANUS-DIPHTH-ACELL PERTUSSIS 5-2.5-18.5 LF-MCG/0.5 IM SUSY
0.5000 mL | PREFILLED_SYRINGE | Freq: Once | INTRAMUSCULAR | Status: AC
  Administered 2023-03-24: 0.5 mL via INTRAMUSCULAR
  Filled 2023-03-24: qty 0.5

## 2023-03-24 NOTE — ED Notes (Signed)
Patient's scalp cleansed with water to remove dried blood, patient tolerated procedure well.

## 2023-03-24 NOTE — Progress Notes (Signed)
Orthopedic Tech Progress Note Patient Details:  Linda Macdonald [birth year] [id number]  Level II trauma, ortho tech not needed at this time.  Patient ID: Linda Macdonald, female   DOB: 08-17-1941, 81 y.o.   MRN: [id number]  Docia Furl 03/24/2023, 3:13 PM

## 2023-03-24 NOTE — ED Triage Notes (Signed)
Pt to the ed from home via ems with a CC of fall. Pt tripped over dog leash and fell and hit her head. Pt relays she takes a blood thinner. Pt denies loc or any other pain.

## 2023-03-24 NOTE — Progress Notes (Signed)
   03/24/23 1455  Spiritual Encounters  Type of Visit Initial  Care provided to: Patient  Conversation partners present during encounter Nurse  Referral source Trauma page  Reason for visit Trauma  OnCall Visit Yes   Chap responded to Trauma 2, fall on thinners page.  PT unavailable as medical team provided care.No support person present.  Chaplain remains available by page.

## 2023-03-24 NOTE — Discharge Instructions (Signed)
Linda Macdonald  Thank you for allowing [place] to take care of you today.  You came to the Emergency Department today because of fall when he tripped on the dog leash.  Given your age and use of Plavix, we did scans of your head and neck, these did not demonstrate any bleeding or broken bones.  We also did a chest x-ray and a pelvis x-ray to make sure that there was not any obvious broken bones, there was not.  You did have a laceration to your forehead.  We sutured this here in the emergency department, the vertical edges of it look well-perfused and likely will heal well, there is a small triangle of skin where the 3 edges come together that we talked about being a little bit more purple, and has the potential to have poor wound healing and that piece of tissue could die.  We talked about following up with the plastic surgeon to see if you need the wound revised at a later date.  We will refer you to plastic surgery.  You should keep the wound clean and dry.  Please keep it totally dry for 24 hours.  After that it is okay to let water run over it in the shower, however do not submerge until the sutures are out, for example in the bathtub or in a swimming pool.  You can put bacitracin over the wound, if you want to keep it dry while you are getting the remaining blood out of your hair, you can put a Tegaderm over it.  You can use Tylenol and ibuprofen for pain control.  You can take this every 4-6 hours as needed.  Please take these medications together for maximum effectiveness, or you can alternate them every 3 hours to have a medication more often.  Please wear a hat or use other sunburn to help minimize scarring.   To-Do: 1. Please follow-up with your primary doctor within 2 days / as soon as possible.  \  Please return to the Emergency Department or call 911 if you experience have worsening of your symptoms, or do not get better, you have difficulty moving or feeling part of your body that  usually able to move or feel, chest pain, shortness of breath, severe or significantly worsening pain, high fever, severe confusion, pass out or have any reason to think that you need emergency medical care.   We hope you feel better soon.   Curley Spice, MD Department of Emergency Medicine [facility]

## 2023-03-24 NOTE — ED Provider Notes (Signed)
[place] EMERGENCY DEPARTMENT AT [facility] Provider Note  Medical Decision Making   HPI: Linda Macdonald is a 82 y.o. 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 Patient arrived via EMS from home.  History provided by patient.  No interpreter required for this encounter.  Patient reports that just prior to arrival she was walking on her back patio, tripped over the dog leash in fell on her face.  Reports that she immediately sustained a laceration and pain to her anterior forehead.  Denies any neck pain, back pain, chest pain, abdominal pain, extremity pain.  States that she takes ASA/Plavix, no other anticoagulants.  Denies loss of consciousness.  ROS: As per HPI. Please see MAR for complete past medical history, surgical history, and social history.   Physical exam is pertinent for laceration to anterior forehead, otherwise no acute traumatic findings.   The differential includes but is not limited to ICH, TBI, skull fracture, spinal fracture/dislocation, blunt thoracic trauma, hemothorax, pneumothorax, rib fractures, blunt abdominal trauma, hemorrhage, extremity fracture, dislocation.  Additional history obtained from: EMS External records from outside source obtained and reviewed including: Reviewed prior vaccine records, most recent prior Tdap in 2012  ED provider interpretation of ECG:   ED provider interpretation of radiology/imaging:  CT head: No ICH or displaced skull fracture CT C-spine: No displaced fracture or dislocation CXR: No acute cardiac or pulmonary abnormality. No appreciable rib fx. No PTX.  Pelvis XR: Pelvic ring intact. No acute fx. Both hips located.   Labs ordered were interpreted by myself as well as my attending and were incorporated into the medical decision making process for this patient.  ED provider interpretation of labs:  CBC: No  leukocytosis, anemia, thrombocytopenia CMP: No AKI or emergent electrolyte derangement Coags: WNL  Interventions: Morphine, Tdap, LET  See the EMR for full details regarding lab and imaging results.  Patient initially presented as a level 2 trauma, as immediately present upon arrival of the patient and during patient's transfer over to the bed.  Manual blood pressure obtained, patient normotensive.  ABCs intact.  IV access confirmed, the secondary exam was performed, and findings are noted above. Pertinent physical exam findings include scalp laceration. Portable XRs performed at the bedside.   Given age, use of ASA/Plavix, feel that CT head and CT C-spine are indicated.  Physical exam without other overt indications of trauma, no chest pain, abdominal pain, extremity pain.  Screening chest x-ray and pelvic x-ray performed, without acute findings.  Patient without any back pain or midline tenderness, do not feel that patient requires CT chest abdomen pelvis, thoracic/lumbar CTs, or dedicated extremity films at this time.  Currently, patient is awake, alert, and protecting own airway and is hemodynamically stable.  CTs do not demonstrate acute intracranial abnormality, overt fractures, no C-spine fracture.  Overall, diagnosis consistent with laceration secondary to GLF.  With regard to laceration, patient initially expressed concern regarding plastic surgery consultation per the advice of her now deceased husband who was formerly a Careers adviser.  Overall, on exam patient does have a stellate laceration, however well approximates, no high tension areas, no muscular or galeal violation, do not feel that patient requires complicated or layered closure.  Patient has 1 area of tissue bridge in the center of her laceration with slight dusky appearance, discussed with patient that this area of tissue could be, hypoperfused and result in increased scarring.  Overall  after thorough examination of the wound, do feel  that primary closure of this wound is within my scope of practice, do not feel that patient requires emergent face, consult for wound closure at this time.  After tensive discussion of risks and benefits with the patient and son at bedside, patient is amenable to primary closure in the ED.  Please see procedure note below.  Discussed with patient that scarring is inevitable with nearly all lacerations, and will refer to plastic surgery for follow-up and possible wound revision should it be needed, additionally discussed measures for minimizing scarring including sunscreen, hats, vitamin E, use of small and least bioreactive sutures (Prolene).  Patient discharged in stable condition.  Consults: Not indicated  Disposition: DISCHARGE: I believe that the patient is safe for discharge home with outpatient follow-up. Patient was informed of all pertinent physical exam, laboratory, and imaging findings. Patient's suspected etiology of their symptom presentation was discussed with the patient and all questions were answered. We discussed following up with PCP and plastic surgery as needed. I provided thorough ED return precautions. The patient feels safe and comfortable with this plan.  The plan for this patient was discussed with Dr. Wilkie Aye, who voiced agreement and who oversaw evaluation and treatment of this patient.  Clinical Impression:  1. Laceration of scalp, initial encounter    Discharge  Therapies: These medications and interventions were provided for the patient while in the ED. Medications  morphine (PF) 2 MG/ML injection 2 mg (2 mg Intravenous Given 03/24/23 1828)  lidocaine-EPINEPHrine-tetracaine (LET) topical gel (3 mLs Topical Given 03/24/23 2009)  Tdap (BOOSTRIX) injection 0.5 mL (0.5 mLs Intramuscular Given 03/24/23 1829)    MDM generated using voice dictation software and may contain dictation errors.  Please contact me for any clarification or with any questions.  Clinical  Complexity A medically appropriate history, review of systems, and physical exam was performed.  Collateral history obtained from: EMS I personally reviewed the labs, EKG, imaging as discussed above. Patient's presentation is most consistent with acute complicated illness / injury requiring diagnostic workup Considered and ruled out life and body threatening conditions  Treatment: Outpatient referral Medications: Prescription and parenteral controlled substances Discussed patient's care with providers from the following different specialties: None  Physical Exam   ED Triage Vitals  Enc Vitals Group     BP --      Pulse --      Resp --      Temp --      Temp src --      SpO2 --      Weight 03/24/23 1500 138 lb 14.2 oz (63 kg)     Height 03/24/23 1500  (1.626 m)     Head Circumference --      Peak Flow --      Pain Score 03/24/23 1459 7     Pain Loc --      Pain Edu? --      Excl. in GC? --      Physical Exam Vitals and nursing note reviewed.  Constitutional:      General: She is not in acute distress.    Appearance: She is well-developed.  HENT:     Head: Normocephalic.     Comments: Stellate laceration to anterior frontal scalp, see image    Nose:     Comments: Blood at the nares, abrasion over bridge of nose, no visible or palpable nasal septal hematoma    Mouth/Throat:  Mouth: Mucous membranes are moist.     Comments: No overt dental trauma, blood in the mouth, malocclusion Eyes:     Extraocular Movements: Extraocular movements intact.     Conjunctiva/sclera: Conjunctivae normal.     Pupils: Pupils are equal, round, and reactive to light.  Cardiovascular:     Rate and Rhythm: Normal rate and regular rhythm.     Heart sounds: No murmur heard. Pulmonary:     Effort: Pulmonary effort is normal. No respiratory distress.     Breath sounds: Normal breath sounds.  Abdominal:     Palpations: Abdomen is soft.     Tenderness: There is no abdominal tenderness.   Musculoskeletal:        General: No swelling.     Cervical back: Neck supple. No bony tenderness.     Thoracic back: No bony tenderness.     Lumbar back: No bony tenderness.     Comments: Apical stable to compression, chest wall stable AP and lateral compression, pelvis stable to AP and lateral compression, all 4 extremities appear atraumatic  Skin:    General: Skin is warm and dry.     Capillary Refill: Capillary refill takes less than 2 seconds.  Neurological:     General: No focal deficit present.     Mental Status: She is alert and oriented to person, place, and time.  Psychiatric:        Mood and Affect: Mood normal.       Procedure Note  .Marland KitchenLaceration Repair  Date/Time: 03/25/2023 12:33 AM  Performed by: Curley Spice, MD Authorized by: Rozelle Logan, DO   Consent:    Consent obtained:  Verbal   Consent given by:  Patient   Risks, benefits, and alternatives were discussed: yes     Risks discussed:  Infection, need for additional repair, nerve damage, pain, poor cosmetic result, poor wound healing and vascular damage   Alternatives discussed:  No treatment, delayed treatment and referral Universal protocol:    Test results available: yes     Imaging studies available: yes     Immediately prior to procedure, a time out was called: yes     Patient identity confirmed:  Verbally with patient Anesthesia:    Anesthesia method:  Topical application   Topical anesthetic:  LET Laceration details:    Location:  Scalp   Length (cm):  8 (Stellate wound, total 8 cm)   Depth (mm):  4 Pre-procedure details:    Preparation:  Patient was prepped and draped in usual sterile fashion and imaging obtained to evaluate for foreign bodies Exploration:    Limited defect created (wound extended): no     Hemostasis achieved with:  LET   Imaging obtained comment:  CT head   Imaging outcome: foreign body not noted     Wound exploration: entire depth of wound visualized     Wound  extent: areolar tissue violated     Wound extent: fascia not violated and no underlying fracture     Contaminated: no   Treatment:    Area cleansed with:  Saline   Amount of cleaning:  Extensive   Irrigation solution:  Sterile saline   Irrigation volume:  1l   Irrigation method:  Syringe   Visualized foreign bodies/material removed: no     Undermining:  None   Scar revision: no   Skin repair:    Repair method:  Sutures   Suture size:  6-0   Suture material:  Prolene  Suture technique:  Simple interrupted   Number of sutures:  12 Approximation:    Approximation:  Close Repair type:    Repair type:  Simple Post-procedure details:    Dressing:  Antibiotic ointment   Procedure completion:  Tolerated well, no immediate complications   CT Head Wo Contrast  Final Result    CT Cervical Spine Wo Contrast  Final Result    DG Chest Portable 1 View  Final Result    DG Pelvis Portable  Final Result      Julianne Rice, MD Emergency Medicine, PGY-2   Curley Spice, MD 03/25/23 0050    Rozelle Logan, DO 03/25/23 2110

## 2023-05-08 ENCOUNTER — Inpatient Hospital Stay: Payer: Medicare Other | Attending: Hematology and Oncology

## 2023-06-04 ENCOUNTER — Ambulatory Visit: Payer: Medicare Other | Admitting: Sports Medicine

## 2023-06-19 ENCOUNTER — Ambulatory Visit: Payer: Medicare Other | Admitting: Sports Medicine

## 2023-06-19 VITALS — BP 118/68 | Ht 63.5 in | Wt 133.0 lb

## 2023-06-19 DIAGNOSIS — M21611 Bunion of right foot: Secondary | ICD-10-CM | POA: Insufficient documentation

## 2023-06-19 NOTE — Assessment & Plan Note (Signed)
We are using sports insoles and MT pads to unload pressure She should continue these  Has transverse arch flattening bilaterally and MT support needed on left as well

## 2023-06-19 NOTE — Progress Notes (Signed)
PCP: Dois Davenport, MD  Subjective:   HPI: Patient is a 82 y.o. 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  She was doing well with metatarsal pad and buddy taping.  Today, she reports that she has overall been doing well but her insoles have worn out and she is hoping to get another one made.  She does not have much pain with day-to-day walking/standing but it does sometimes bother her when she stands for long periods of time.  She walks for about an hour every day.  She is still buddy taping which works well for her.   Past Medical History:  Diagnosis Date   Arthritis    Chronic kidney disease    Depression    Hair loss    d/t propranolol   hypothyroidism    Leukopenia 08/12/2012    Current Outpatient Medications on File Prior to Visit  Medication Sig Dispense Refill   B Complex Vitamins (B COMPLEX PO) Take by mouth.     clopidogrel (PLAVIX) 75 MG tablet Take 1 tablet by mouth daily.     estradiol (ESTRACE) 0.1 MG/GM vaginal cream Place 1 Applicatorful vaginally 2 (two) times a week. As needed     famotidine (PEPCID) 40 MG tablet Take 40 mg by mouth 2 (two) times daily.     levothyroxine (LEVOTHROID) 25 MCG tablet Take 1 tablet (25 mcg total) by mouth daily. 90 tablet 3   spironolactone (ALDACTONE) 50 MG tablet Take 50 mg by mouth daily.     UNABLE TO FIND Med Name: collagen peptides powder     Vitamin D, Ergocalciferol, (DRISDOL) 1.25 MG (50000 UNIT) CAPS capsule Take 50,000 Units by mouth every 7 (seven) days.     No current facility-administered medications on file prior to visit.    Past Surgical History:  Procedure Laterality Date   CYSTOSCOPY/URETEROSCOPY/HOLMIUM LASER/STENT PLACEMENT Right 04/23/2021   Procedure: CYSTOSCOPY/URETEROSCOPY/HOLMIUM LASER/STENT PLACEMENT;  Surgeon: Rene Paci, MD;  Location: [facility];  Service: Urology;   Laterality: Right;   EXTRACORPOREAL SHOCK WAVE LITHOTRIPSY Left 04/25/2017   Procedure: LEFT EXTRACORPOREAL SHOCK WAVE LITHOTRIPSY (ESWL);  Surgeon: Heloise Purpura, MD;  Location: [facility];  Service: Urology;  Laterality: Left;   KNEE ARTHROSCOPY Left 2oyrs ago   L knee x2/ R knee x1   LITHOTRIPSY  2012   difficulty voiding after procedure, d/c home with foley per pt   TUBAL LIGATION     WISDOM TOOTH EXTRACTION     teenager    Allergies  Allergen Reactions   Erythromycin Swelling   Penicillins Swelling and Rash    There were no vitals taken for this visit.      No data to display              No data to display              Objective:  Physical Exam:  Gen: NAD, comfortable in exam room  Right foot: Hallux valgus noted with hammertoe of second toe. Slight hammertoe noted on third toe. Nontender to palpation throughout.   Knuckle pad at dorsum of PIP 2 Full range of motion of the ankle and toes, although second toe has limited ROM due to hammertoe. 5/5 strength Normal sensation DP pulse 2+   Assessment & Plan:  1. R Hallux valgus, R second toe hammertoe  Pt doing well  overall, would benefit from new insoles with slightly higher (medium) metatarsal pad which was provided today. Also provided patient with toe spacer to use between first and second toe; continue with buddy taping while using the spacer. Follow up as needed.  I observed and examined the patient with the resident and agree with assessment and plan.  Note reviewed and modified by me. Sterling Big, MD

## 2023-07-23 ENCOUNTER — Institutional Professional Consult (permissible substitution): Payer: Medicare Other | Admitting: Plastic Surgery

## 2023-08-07 ENCOUNTER — Inpatient Hospital Stay: Payer: Medicare Other | Attending: Hematology and Oncology

## 2023-08-07 ENCOUNTER — Other Ambulatory Visit: Payer: Self-pay | Admitting: Hematology and Oncology

## 2023-08-07 ENCOUNTER — Inpatient Hospital Stay: Payer: Medicare Other | Admitting: Hematology and Oncology

## 2023-08-07 DIAGNOSIS — Z800 Family history of malignant neoplasm of digestive organs: Secondary | ICD-10-CM | POA: Diagnosis not present

## 2023-08-07 LAB — CMP (CANCER CENTER ONLY)
ALT: 15 U/L (ref 0–44)
AST: 21 U/L (ref 15–41)
Albumin: 4.2 g/dL (ref 3.5–5.0)
Alkaline Phosphatase: 56 U/L (ref 38–126)
Anion gap: 6 (ref 5–15)
BUN: 16 mg/dL (ref 8–23)
CO2: 26 mmol/L (ref 22–32)
Calcium: 9.6 mg/dL (ref 8.9–10.3)
Chloride: 103 mmol/L (ref 98–111)
Creatinine: 0.96 mg/dL (ref 0.44–1.00)
GFR, Estimated: 59 mL/min — ABNORMAL LOW (ref >60)
Glucose, Bld: 74 mg/dL (ref 70–99)
Potassium: 3.9 mmol/L (ref 3.5–5.1)
Sodium: 135 mmol/L (ref 135–145)
Total Bilirubin: 0.8 mg/dL (ref 0.3–1.2)
Total Protein: 6.6 g/dL (ref 6.5–8.1)

## 2023-08-07 LAB — CBC WITH DIFFERENTIAL (CANCER CENTER ONLY)
Abs Immature Granulocytes: 0.02 10*3/uL (ref 0.00–0.07)
Basophils Absolute: 0 10*3/uL (ref 0.0–0.1)
Basophils Relative: 1 %
Eosinophils Absolute: 0.1 10*3/uL (ref 0.0–0.5)
Eosinophils Relative: 1 %
HCT: 40.1 % (ref 36.0–46.0)
Hemoglobin: 14.3 g/dL (ref 12.0–15.0)
Immature Granulocytes: 0 %
Lymphocytes Relative: 25 %
Lymphs Abs: 1.2 10*3/uL (ref 0.7–4.0)
MCH: 33.3 pg (ref 26.0–34.0)
MCHC: 35.7 g/dL (ref 30.0–36.0)
MCV: 93.3 fL (ref 80.0–100.0)
Monocytes Absolute: 0.4 10*3/uL (ref 0.1–1.0)
Monocytes Relative: 9 %
Neutro Abs: 2.9 10*3/uL (ref 1.7–7.7)
Neutrophils Relative %: 64 %
Platelet Count: 243 10*3/uL (ref 150–400)
RBC: 4.3 MIL/uL (ref 3.87–5.11)
RDW: 11.9 % (ref 11.5–15.5)
WBC Count: 4.6 10*3/uL (ref 4.0–10.5)
nRBC: 0 % (ref 0.0–0.2)

## 2023-08-07 LAB — FERRITIN: Ferritin: 61 ng/mL (ref 11–307)

## 2023-08-07 NOTE — Progress Notes (Signed)
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 Testing showed ferritin 173 and genetic testing consistent with  Homozygous C282Y 12/22/2021: Started phlebotomy q 2 weeks.  02/28/2022: last phlebotomy performed.   Interval History:  Linda Macdonald 82 y.o. female with medical history significant for hereditary hemochromatosis presents for a follow up visit. Patient was last seen on 07/20/2022. In the interim since the last visit she has not required any further therapeutic phlebotomy.  On exam today Linda Macdonald reports she has been well overall.  She has had no changes in her health.  She notes her energy levels are good and her appetite is strong.  She notes that she tends to workout twice per week.  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  She notes that overall she has no questions concerns or complaints.  She is willing and able to continue with observation at this time.  She did have some questions about the heredity of this disease and how it can be passed on to her children and grandchildren.  We discussed those in detail with her today.. She denies fevers, chills, night sweats, shortness of breath, chest pain or cough.  She has no other complaints.  Rest of the 10 point ROS is below.   MEDICAL HISTORY:  Past Medical History:  Diagnosis Date   Arthritis    Chronic kidney disease    Depression    Hair loss    d/t propranolol    hypothyroidism    Leukopenia 08/12/2012    SURGICAL HISTORY: Past Surgical History:  Procedure Laterality Date   CYSTOSCOPY/URETEROSCOPY/HOLMIUM LASER/STENT PLACEMENT Right 04/23/2021   Procedure: CYSTOSCOPY/URETEROSCOPY/HOLMIUM LASER/STENT PLACEMENT;  Surgeon: Rene Paci, MD;  Location: [facility];  Service: Urology;  Laterality: Right;   EXTRACORPOREAL SHOCK WAVE LITHOTRIPSY Left 04/25/2017   Procedure: LEFT EXTRACORPOREAL SHOCK WAVE LITHOTRIPSY (ESWL);  Surgeon: Heloise Purpura, MD;  Location: [facility];  Service: Urology;  Laterality: Left;   KNEE ARTHROSCOPY Left 2oyrs ago   L knee x2/ R knee x1   LITHOTRIPSY  2012   difficulty voiding after procedure, d/c home with foley per pt   TUBAL LIGATION     WISDOM TOOTH EXTRACTION     teenager    SOCIAL HISTORY: Social History   Socioeconomic History   Marital status: Widowed    Spouse name: Not on file   Number of children: 3   Years of education: College   Highest education level: Not on file  Occupational History   Not on file  Tobacco Use   Smoking status: Never   Smokeless tobacco: Never  Vaping Use   Vaping status: Never Used  Substance and Sexual Activity   Alcohol use: Yes    Comment: socially   Drug use: No   Sexual activity: Not on file  Other Topics Concern   Not on file  Social History Narrative   Patient is a widow and lives alone with her dog.   Patient has three children.   Patient is retired.   Patient has a college education.  Patient is right-handed.   Caffeine: Patient drinks 1 cup of coffee 2-3 times per week      Social Determinants of Health   Financial Resource Strain: Not on file  Food Insecurity: Not on file  Transportation Needs: Not on file  Physical Activity: Not on file  Stress: Not on file  Social Connections: Not on file  Intimate Partner Violence: Not on file    FAMILY HISTORY: Family History  Problem Relation Age of Onset   Dementia Father    Cancer Brother     Colon cancer Maternal Grandmother    Other Son        salivary gland "tumor"    ALLERGIES:  is allergic to erythromycin and penicillins.  MEDICATIONS:  Current Outpatient Medications  Medication Sig Dispense Refill   B Complex Vitamins (B COMPLEX PO) Take by mouth.     clopidogrel (PLAVIX) 75 MG tablet Take 1 tablet by mouth daily.     estradiol (ESTRACE) 0.1 MG/GM vaginal cream Place 1 Applicatorful vaginally 2 (two) times a week. As needed     famotidine (PEPCID) 40 MG tablet Take 40 mg by mouth 2 (two) times daily.     levothyroxine (LEVOTHROID) 25 MCG tablet Take 1 tablet (25 mcg total) by mouth daily. 90 tablet 3   spironolactone (ALDACTONE) 50 MG tablet Take 50 mg by mouth daily.     UNABLE TO FIND Med Name: collagen peptides powder     Vitamin D, Ergocalciferol, (DRISDOL) 1.25 MG (50000 UNIT) CAPS capsule Take 50,000 Units by mouth every 7 (seven) days.     No current facility-administered medications for this visit.    REVIEW OF SYSTEMS:   Constitutional: ( - ) fevers, ( - )  chills , ( - ) night sweats Eyes: ( - ) blurriness of vision, ( - ) double vision, ( - ) watery eyes Ears, nose, mouth, throat, and face: ( - ) mucositis, ( - ) sore throat Respiratory: ( - ) cough, ( - ) dyspnea, ( - ) wheezes Cardiovascular: ( - ) palpitation, ( - ) chest discomfort, ( - ) lower extremity swelling Gastrointestinal:  ( - ) nausea, ( - ) heartburn, ( - ) change in bowel habits Skin: ( - ) abnormal skin rashes Lymphatics: ( - ) new lymphadenopathy, ( - ) easy bruising Neurological: ( - ) numbness, ( - ) tingling, ( - ) new weaknesses Behavioral/Psych: ( - ) mood change, ( - ) new changes  All other systems were reviewed with the patient and are negative.  PHYSICAL EXAMINATION:  Vitals:   08/07/23 1009  BP: (!) 111/48  Pulse: 75  Resp: 16  Temp: 98.3 F (36.8 C)  SpO2: 99%     Filed Weights   08/07/23 1009  Weight: 135 lb 1.6 oz (61.3 kg)      GENERAL: Well-appearing  elderly Caucasian female, alert, no distress and comfortable SKIN: skin color, texture, turgor are normal.   EYES: conjunctiva are pink and non-injected, sclera clear.  LUNGS: clear to auscultation and percussion with normal breathing effort HEART: regular rate & rhythm and no murmurs and no lower extremity edema Musculoskeletal: no cyanosis of digits and no clubbing  PSYCH: alert & oriented x 3, fluent speech NEURO: no focal motor/sensory deficits  LABORATORY DATA:  I have reviewed the data as listed    Latest Ref Rng & Units 08/07/2023    9:32 AM 03/24/2023    3:20 PM 02/06/2023    8:01 AM  CBC  WBC 4.0 - 10.5 K/uL 4.6  6.3  4.4   Hemoglobin 12.0 - 15.0 g/dL 29.5  62.1  30.8   Hematocrit 36.0 - 46.0 % 40.1  41.3  41.9   Platelets 150 - 400 K/uL 243  200  227        Latest Ref Rng & Units 08/07/2023    9:32 AM 03/24/2023    3:20 PM 07/20/2022   10:21 AM  CMP  Glucose 70 - 99 mg/dL 74  657  86   BUN 8 - 23 mg/dL 16  17  17    Creatinine 0.44 - 1.00 mg/dL 8.46  9.62  9.52   Sodium 135 - 145 mmol/L 135  132  133   Potassium 3.5 - 5.1 mmol/L 3.9  4.5  4.2   Chloride 98 - 111 mmol/L 103  101  100   CO2 22 - 32 mmol/L 26  20  28    Calcium 8.9 - 10.3 mg/dL 9.6  9.2  9.5   Total Protein 6.5 - 8.1 g/dL 6.6  5.9  6.6   Total Bilirubin 0.3 - 1.2 mg/dL 0.8  0.6  0.6   Alkaline Phos 38 - 126 U/L 56  53  64   AST 15 - 41 U/L 21  21  22    ALT 0 - 44 U/L 15  17  17     RADIOGRAPHIC STUDIES: No results found.  ASSESSMENT & PLAN AZRIELLA JUSKO 82 y.o. female with medical history significant for hereditary hemochromatosis presents for a follow up visit.   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 --Baseline labs including hemoglobin A1c, TSH, and echocardiogram showed no evidence of damage to organs from iron overload.  MRI previously confirmed heavy iron deposition in the liver and spleen. --Labs  today show a hemoglobin 14.3, white blood cell count 4.6, MCV 93.3, and platelets of 243. Ferritin level is 61. -- HOLD phlebotomies while patient is in the <150 range. If >150 can restart q 2 week phlebotomies with goal of ferritin <50 --RTC in 6 months for labs and follow with interval 47-month labs.  No orders of the defined types were placed in this encounter.   All questions were answered. The patient knows to call the clinic with any problems, questions or concerns.  I have spent a total of 25 minutes minutes of face-to-face and non-face-to-face time, preparing to see the patient, performing a medically appropriate examination, counseling and educating the patient, ordering tests/procedures, documenting clinical information in the electronic health record, and care coordination.   Ulysees Barns, MD Department of Hematology/Oncology [facility] Phone: [phone] Pager: [phone] Email: [email or web address]

## 2023-08-08 ENCOUNTER — Telehealth: Payer: Self-pay

## 2023-08-08 NOTE — Telephone Encounter (Signed)
LVM for pt to give a call back regarding lab results at [phone] and follow prompt for Dr. Derek Mound nurse.

## 2023-08-09 ENCOUNTER — Telehealth: Payer: Self-pay | Admitting: *Deleted

## 2023-08-09 NOTE — Telephone Encounter (Signed)
Received call from pt regarding her recent lab results from 08/07/23. Reviewed labs with her. Advised that no phlebotomies were necessary at this time. She has requested that these labs be mailed to her. Advised that I would do this for her. She is aware of her next appt in 6 months

## 2023-08-13 ENCOUNTER — Institutional Professional Consult (permissible substitution): Payer: Medicare Other | Admitting: Plastic Surgery

## 2023-08-15 ENCOUNTER — Telehealth: Payer: Self-pay

## 2023-08-15 NOTE — Telephone Encounter (Signed)
Pt called to inform her that her Ferritin is 61. We will have her back for labs in 6 months and labs/clinic visit in 12 months. Pt verbalized understanding.

## 2023-12-13 ENCOUNTER — Other Ambulatory Visit: Payer: Self-pay | Admitting: Gastroenterology

## 2023-12-13 DIAGNOSIS — R933 Abnormal findings on diagnostic imaging of other parts of digestive tract: Secondary | ICD-10-CM

## 2024-01-06 ENCOUNTER — Telehealth: Payer: Self-pay | Admitting: *Deleted

## 2024-01-06 NOTE — Telephone Encounter (Signed)
-----   Message from Anson Fret sent at 01/02/2024 12:23 PM EST ----- Regarding: Clearance for patient Dr. Loreta Ave and Dr. Elnoria Howard, I received this clearance this week for patient. I have not seen her for almost 1.5 years and thus I cannot give clearance for her. I would recommend getting clearance from her primary care, my apologies.   Dr. Lucia Gaskins

## 2024-01-06 NOTE — Telephone Encounter (Signed)
Comments  Thanks for letting us know. We will contact her PCP.

## 2024-01-19 ENCOUNTER — Ambulatory Visit
Admission: RE | Admit: 2024-01-19 | Discharge: 2024-01-19 | Disposition: A | Discharge status: Home / Self Care | Payer: Medicare Other | Source: Ambulatory Visit | Attending: Gastroenterology | Admitting: Gastroenterology

## 2024-01-19 DIAGNOSIS — R933 Abnormal findings on diagnostic imaging of other parts of digestive tract: Secondary | ICD-10-CM

## 2024-01-19 MED ORDER — GADOPICLENOL 0.5 MMOL/ML IV SOLN
6.0000 mL | Freq: Once | INTRAVENOUS | Status: AC | PRN
  Administered 2024-01-19: 6 mL via INTRAVENOUS

## 2024-01-30 ENCOUNTER — Other Ambulatory Visit: Payer: Self-pay | Admitting: Physician Assistant

## 2024-01-31 ENCOUNTER — Other Ambulatory Visit: Payer: Self-pay | Admitting: Hematology and Oncology

## 2024-01-31 ENCOUNTER — Encounter: Payer: Self-pay | Admitting: Physician Assistant

## 2024-01-31 ENCOUNTER — Inpatient Hospital Stay: Payer: Medicare Other | Attending: Hematology and Oncology

## 2024-01-31 LAB — CBC WITH DIFFERENTIAL (CANCER CENTER ONLY)
Abs Immature Granulocytes: 0.02 10*3/uL (ref 0.00–0.07)
Basophils Absolute: 0.1 10*3/uL (ref 0.0–0.1)
Basophils Relative: 1 %
Eosinophils Absolute: 0.1 10*3/uL (ref 0.0–0.5)
Eosinophils Relative: 2 %
HCT: 43.7 % (ref 36.0–46.0)
Hemoglobin: 15.5 g/dL — ABNORMAL HIGH (ref 12.0–15.0)
Immature Granulocytes: 0 %
Lymphocytes Relative: 24 %
Lymphs Abs: 1.2 10*3/uL (ref 0.7–4.0)
MCH: 33.3 pg (ref 26.0–34.0)
MCHC: 35.5 g/dL (ref 30.0–36.0)
MCV: 93.8 fL (ref 80.0–100.0)
Monocytes Absolute: 0.4 10*3/uL (ref 0.1–1.0)
Monocytes Relative: 8 %
Neutro Abs: 3.4 10*3/uL (ref 1.7–7.7)
Neutrophils Relative %: 65 %
Platelet Count: 242 10*3/uL (ref 150–400)
RBC: 4.66 MIL/uL (ref 3.87–5.11)
RDW: 11.9 % (ref 11.5–15.5)
WBC Count: 5.3 10*3/uL (ref 4.0–10.5)
nRBC: 0 % (ref 0.0–0.2)

## 2024-01-31 LAB — IRON AND IRON BINDING CAPACITY (CC-WL,HP ONLY)
Iron: 192 ug/dL — ABNORMAL HIGH (ref 28–170)
Saturation Ratios: 61 % — ABNORMAL HIGH (ref 10.4–31.8)
TIBC: 314 ug/dL (ref 250–450)
UIBC: 122 ug/dL — ABNORMAL LOW (ref 148–442)

## 2024-01-31 LAB — CMP (CANCER CENTER ONLY)
ALT: 18 U/L (ref 0–44)
AST: 23 U/L (ref 15–41)
Albumin: 4.6 g/dL (ref 3.5–5.0)
Alkaline Phosphatase: 64 U/L (ref 38–126)
Anion gap: 5 (ref 5–15)
BUN: 16 mg/dL (ref 8–23)
CO2: 28 mmol/L (ref 22–32)
Calcium: 10.1 mg/dL (ref 8.9–10.3)
Chloride: 100 mmol/L (ref 98–111)
Creatinine: 0.97 mg/dL (ref 0.44–1.00)
GFR, Estimated: 59 mL/min — ABNORMAL LOW (ref >60)
Glucose, Bld: 86 mg/dL (ref 70–99)
Potassium: 4.6 mmol/L (ref 3.5–5.1)
Sodium: 133 mmol/L — ABNORMAL LOW (ref 135–145)
Total Bilirubin: 0.7 mg/dL (ref 0.0–1.2)
Total Protein: 7 g/dL (ref 6.5–8.1)

## 2024-01-31 LAB — RETIC PANEL
Immature Retic Fract: 3.8 % (ref 2.3–15.9)
RBC.: 4.64 MIL/uL (ref 3.87–5.11)
Retic Count, Absolute: 62.2 10*3/uL (ref 19.0–186.0)
Retic Ct Pct: 1.3 % (ref 0.4–3.1)
Reticulocyte Hemoglobin: 36.6 pg (ref >27.9)

## 2024-01-31 LAB — FERRITIN: Ferritin: 59 ng/mL (ref 11–307)

## 2024-04-03 ENCOUNTER — Encounter: Payer: Self-pay | Admitting: Gastroenterology

## 2024-04-03 ENCOUNTER — Other Ambulatory Visit: Payer: Self-pay | Admitting: Gastroenterology

## 2024-04-03 DIAGNOSIS — R1310 Dysphagia, unspecified: Secondary | ICD-10-CM

## 2024-04-07 ENCOUNTER — Ambulatory Visit
Admission: RE | Admit: 2024-04-07 | Discharge: 2024-04-07 | Disposition: A | Discharge status: Home / Self Care | Source: Ambulatory Visit | Attending: Gastroenterology | Admitting: Gastroenterology

## 2024-04-07 DIAGNOSIS — R1310 Dysphagia, unspecified: Secondary | ICD-10-CM

## 2024-06-25 ENCOUNTER — Other Ambulatory Visit: Payer: Self-pay | Admitting: Otolaryngology

## 2024-06-25 DIAGNOSIS — K219 Gastro-esophageal reflux disease without esophagitis: Secondary | ICD-10-CM

## 2024-06-25 DIAGNOSIS — R1314 Dysphagia, pharyngoesophageal phase: Secondary | ICD-10-CM

## 2024-07-02 ENCOUNTER — Ambulatory Visit
Admission: RE | Admit: 2024-07-02 | Discharge: 2024-07-02 | Disposition: A | Discharge status: Home / Self Care | Source: Ambulatory Visit | Attending: Otolaryngology | Admitting: Otolaryngology

## 2024-07-02 DIAGNOSIS — R1314 Dysphagia, pharyngoesophageal phase: Secondary | ICD-10-CM

## 2024-07-02 DIAGNOSIS — K219 Gastro-esophageal reflux disease without esophagitis: Secondary | ICD-10-CM

## 2024-08-05 ENCOUNTER — Inpatient Hospital Stay: Payer: Medicare Other | Attending: Hematology and Oncology

## 2024-08-05 ENCOUNTER — Inpatient Hospital Stay: Payer: Medicare Other | Admitting: Hematology and Oncology

## 2024-08-05 DIAGNOSIS — Z808 Family history of malignant neoplasm of other organs or systems: Secondary | ICD-10-CM | POA: Diagnosis not present

## 2024-08-05 DIAGNOSIS — Z800 Family history of malignant neoplasm of digestive organs: Secondary | ICD-10-CM | POA: Diagnosis not present

## 2024-08-05 LAB — CBC WITH DIFFERENTIAL (CANCER CENTER ONLY)
Abs Immature Granulocytes: 0.01 K/uL (ref 0.00–0.07)
Basophils Absolute: 0 K/uL (ref 0.0–0.1)
Basophils Relative: 1 %
Eosinophils Absolute: 0.1 K/uL (ref 0.0–0.5)
Eosinophils Relative: 2 %
HCT: 39.1 % (ref 36.0–46.0)
Hemoglobin: 14 g/dL (ref 12.0–15.0)
Immature Granulocytes: 0 %
Lymphocytes Relative: 28 %
Lymphs Abs: 1.2 K/uL (ref 0.7–4.0)
MCH: 33.4 pg (ref 26.0–34.0)
MCHC: 35.8 g/dL (ref 30.0–36.0)
MCV: 93.3 fL (ref 80.0–100.0)
Monocytes Absolute: 0.4 K/uL (ref 0.1–1.0)
Monocytes Relative: 9 %
Neutro Abs: 2.5 K/uL (ref 1.7–7.7)
Neutrophils Relative %: 60 %
Platelet Count: 240 K/uL (ref 150–400)
RBC: 4.19 MIL/uL (ref 3.87–5.11)
RDW: 12.4 % (ref 11.5–15.5)
WBC Count: 4.1 K/uL (ref 4.0–10.5)
nRBC: 0 % (ref 0.0–0.2)

## 2024-08-05 LAB — CMP (CANCER CENTER ONLY)
ALT: 14 U/L (ref 0–44)
AST: 20 U/L (ref 15–41)
Albumin: 4.3 g/dL (ref 3.5–5.0)
Alkaline Phosphatase: 48 U/L (ref 38–126)
Anion gap: 5 (ref 5–15)
BUN: 14 mg/dL (ref 8–23)
CO2: 29 mmol/L (ref 22–32)
Calcium: 9.8 mg/dL (ref 8.9–10.3)
Chloride: 102 mmol/L (ref 98–111)
Creatinine: 0.94 mg/dL (ref 0.44–1.00)
GFR, Estimated: >60 mL/min (ref >60)
Glucose, Bld: 72 mg/dL (ref 70–99)
Potassium: 3.8 mmol/L (ref 3.5–5.1)
Sodium: 136 mmol/L (ref 135–145)
Total Bilirubin: 0.6 mg/dL (ref 0.0–1.2)
Total Protein: 6.6 g/dL (ref 6.5–8.1)

## 2024-08-05 LAB — FERRITIN: Ferritin: 135 ng/mL (ref 11–307)

## 2024-08-05 NOTE — Progress Notes (Signed)
 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 Testing showed ferritin 173 and genetic testing consistent with  Homozygous C282Y 12/22/2021: Started phlebotomy q 2 weeks.  02/28/2022: last phlebotomy performed.   Interval History:  Linda Macdonald 83 y.o. female with medical history significant for hereditary hemochromatosis presents for a follow up visit. Patient was last seen on 08/07/2023. In the interim since the last visit she has not required any further therapeutic phlebotomy.  On exam today Mrs. Beauchesne reports she has been well overall in the interim since her last visit 1 year ago.  She reports she has been having some issues with swallowing which she is following with ENT.  She reports she is on a restricted diet and avoiding things like tomatoes, garlic, and citrus.  She reports that she is feeling better now that she is on this diet.  She notes her energy level is good at about a 9 out of 10.  She is taking nap daily and sleeping for about 20 to 30 minutes/day.  She reports that she has been eating well and doing her best to avoid fast food and packaged foods.  She has had no recent illnesses such as fevers, chills, sweats, nausea, vomiting or diarrhea.  She has had no runny nose, sore throat, cough.  Overall she has been well and has no questions concerns or complaints today.  A full 10 point ROS is otherwise negative.   MEDICAL HISTORY:  Past Medical History:  Diagnosis Date   Arthritis    Chronic kidney disease     Depression    Hair loss    d/t propranolol    hypothyroidism    Leukopenia 08/12/2012    SURGICAL HISTORY: Past Surgical History:  Procedure Laterality Date   CYSTOSCOPY/URETEROSCOPY/HOLMIUM LASER/STENT PLACEMENT Right 04/23/2021   Procedure: CYSTOSCOPY/URETEROSCOPY/HOLMIUM LASER/STENT PLACEMENT;  Surgeon: Devere Lonni Righter, MD;  Location: [facility];  Service: Urology;  Laterality: Right;   EXTRACORPOREAL SHOCK WAVE LITHOTRIPSY Left 04/25/2017   Procedure: LEFT EXTRACORPOREAL SHOCK WAVE LITHOTRIPSY (ESWL);  Surgeon: Renda Glance, MD;  Location: [facility];  Service: Urology;  Laterality: Left;   KNEE ARTHROSCOPY Left 2oyrs ago   L knee x2/ R knee x1   LITHOTRIPSY  2012   difficulty voiding after procedure, d/c home with foley per pt   TUBAL LIGATION     WISDOM TOOTH EXTRACTION     teenager    SOCIAL HISTORY: Social History   Socioeconomic History   Marital status: Widowed    Spouse name: Not on file   Number of children: 3   Years of education: College   Highest education level: Not on file  Occupational History   Not on file  Tobacco Use   Smoking status: Never   Smokeless tobacco: Never  Vaping Use   Vaping status: Never Used  Substance and Sexual Activity   Alcohol use: Yes    Comment: socially   Drug use: No   Sexual activity: Not on file  Other Topics Concern   Not on file  Social History Narrative   Patient is a widow and lives alone with her dog.  Patient has three children.   Patient is retired.   Patient has a college education.   Patient is right-handed.   Caffeine: Patient drinks 1 cup of coffee 2-3 times per week      Social Drivers of Corporate investment banker Strain: Not on file  Food Insecurity: Not on file  Transportation Needs: Not on file  Physical Activity: Not on file  Stress: Not on file  Social Connections: Not on file  Intimate Partner Violence: Not on file    FAMILY HISTORY: Family History  Problem Relation Age of Onset    Dementia Father    Cancer Brother    Colon cancer Maternal Grandmother    Other Son        salivary gland tumor    ALLERGIES:  is allergic to erythromycin and penicillins.  MEDICATIONS:  Current Outpatient Medications  Medication Sig Dispense Refill   B Complex Vitamins (B COMPLEX PO) Take by mouth.     clopidogrel (PLAVIX) 75 MG tablet Take 1 tablet by mouth daily.     estradiol (ESTRACE) 0.1 MG/GM vaginal cream Place 1 Applicatorful vaginally 2 (two) times a week. As needed     famotidine (PEPCID) 40 MG tablet Take 40 mg by mouth 2 (two) times daily.     levothyroxine  (LEVOTHROID) 25 MCG tablet Take 1 tablet (25 mcg total) by mouth daily. 90 tablet 3   spironolactone (ALDACTONE) 50 MG tablet Take 50 mg by mouth daily.     UNABLE TO FIND Med Name: collagen peptides powder     Vitamin D, Ergocalciferol, (DRISDOL) 1.25 MG (50000 UNIT) CAPS capsule Take 50,000 Units by mouth every 7 (seven) days.     No current facility-administered medications for this visit.    REVIEW OF SYSTEMS:   Constitutional: ( - ) fevers, ( - )  chills , ( - ) night sweats Eyes: ( - ) blurriness of vision, ( - ) double vision, ( - ) watery eyes Ears, nose, mouth, throat, and face: ( - ) mucositis, ( - ) sore throat Respiratory: ( - ) cough, ( - ) dyspnea, ( - ) wheezes Cardiovascular: ( - ) palpitation, ( - ) chest discomfort, ( - ) lower extremity swelling Gastrointestinal:  ( - ) nausea, ( - ) heartburn, ( - ) change in bowel habits Skin: ( - ) abnormal skin rashes Lymphatics: ( - ) new lymphadenopathy, ( - ) easy bruising Neurological: ( - ) numbness, ( - ) tingling, ( - ) new weaknesses Behavioral/Psych: ( - ) mood change, ( - ) new changes  All other systems were reviewed with the patient and are negative.  PHYSICAL EXAMINATION:  Vitals:   08/05/24 1010  BP: 118/61  Pulse: 69  Resp: 13  Temp: (!) 97.3 F (36.3 C)  SpO2: 98%      Filed Weights   08/05/24 1010  Weight: 130 lb 14.4 oz  (59.4 kg)       GENERAL: Well-appearing elderly Caucasian female, alert, no distress and comfortable SKIN: skin color, texture, turgor are normal.   EYES: conjunctiva are pink and non-injected, sclera clear.  LUNGS: clear to auscultation and percussion with normal breathing effort HEART: regular rate & rhythm and no murmurs and no lower extremity edema Musculoskeletal: no cyanosis of digits and no clubbing  PSYCH: alert & oriented x 3, fluent speech NEURO: no focal motor/sensory deficits  LABORATORY DATA:  I have reviewed the data as listed    Latest Ref Rng &  Units 08/05/2024    9:51 AM 01/31/2024   10:30 AM 08/07/2023    9:32 AM  CBC  WBC 4.0 - 10.5 K/uL 4.1  5.3  4.6   Hemoglobin 12.0 - 15.0 g/dL 85.9  84.4  85.6   Hematocrit 36.0 - 46.0 % 39.1  43.7  40.1   Platelets 150 - 400 K/uL 240  242  243        Latest Ref Rng & Units 08/05/2024    9:51 AM 01/31/2024   10:30 AM 08/07/2023    9:32 AM  CMP  Glucose 70 - 99 mg/dL 72  86  74   BUN 8 - 23 mg/dL 14  16  16    Creatinine 0.44 - 1.00 mg/dL 9.05  9.02  9.03   Sodium 135 - 145 mmol/L 136  133  135   Potassium 3.5 - 5.1 mmol/L 3.8  4.6  3.9   Chloride 98 - 111 mmol/L 102  100  103   CO2 22 - 32 mmol/L 29  28  26    Calcium 8.9 - 10.3 mg/dL 9.8  89.8  9.6   Total Protein 6.5 - 8.1 g/dL 6.6  7.0  6.6   Total Bilirubin 0.0 - 1.2 mg/dL 0.6  0.7  0.8   Alkaline Phos 38 - 126 U/L 48  64  56   AST 15 - 41 U/L 20  23  21    ALT 0 - 44 U/L 14  18  15     RADIOGRAPHIC STUDIES: No results found.  ASSESSMENT & PLAN AUNNA SNOOKS 83 y.o. female with medical history significant for hereditary hemochromatosis presents for a follow up visit.   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 --Baseline labs including hemoglobin A1c, TSH, and echocardiogram showed no evidence of damage to organs from iron overload.  MRI previously confirmed heavy iron  deposition in the liver and spleen. --Labs today show a hemoglobin 14.0, WBC 4.1, MCV 93.3, Plt 240. Ferritin 135 today.  -- HOLD phlebotomies while patient is in the <150 range. If >150 can restart q 2 week phlebotomies with goal of ferritin <50 --RTC in 12 months for labs and follow with interval 49-month labs.  No orders of the defined types were placed in this encounter.   All questions were answered. The patient knows to call the clinic with any problems, questions or concerns.  I have spent a total of 25 minutes minutes of face-to-face and non-face-to-face time, preparing to see the patient, performing a medically appropriate examination, counseling and educating the patient, ordering tests/procedures, documenting clinical information in the electronic health record, and care coordination.   Norleen IVAR Kidney, MD Department of Hematology/Oncology [facility] Phone: [phone] Pager: [phone] Email: [email or web address]

## 2025-02-03 ENCOUNTER — Other Ambulatory Visit

## 2025-08-11 ENCOUNTER — Other Ambulatory Visit

## 2025-08-11 ENCOUNTER — Ambulatory Visit: Admitting: Hematology and Oncology
